# Patient Record
Sex: Female | Born: 1960 | Race: White | Hispanic: No | Marital: Married | Smoking: Never smoker
Health system: Southern US, Community
[De-identification: ages and names within clinical notes are randomized; demographics above are authoritative.]

## PROBLEM LIST (undated history)

## (undated) DIAGNOSIS — E119 Type 2 diabetes mellitus without complications: Secondary | ICD-10-CM

## (undated) DIAGNOSIS — R079 Chest pain, unspecified: Secondary | ICD-10-CM

## (undated) DIAGNOSIS — G4730 Sleep apnea, unspecified: Secondary | ICD-10-CM

## (undated) DIAGNOSIS — M5137 Other intervertebral disc degeneration, lumbosacral region: Secondary | ICD-10-CM

## (undated) DIAGNOSIS — R7302 Impaired glucose tolerance (oral): Secondary | ICD-10-CM

## (undated) DIAGNOSIS — F411 Generalized anxiety disorder: Secondary | ICD-10-CM

## (undated) DIAGNOSIS — M51379 Other intervertebral disc degeneration, lumbosacral region without mention of lumbar back pain or lower extremity pain: Secondary | ICD-10-CM

## (undated) DIAGNOSIS — G459 Transient cerebral ischemic attack, unspecified: Secondary | ICD-10-CM

## (undated) DIAGNOSIS — I341 Nonrheumatic mitral (valve) prolapse: Secondary | ICD-10-CM

## (undated) DIAGNOSIS — E041 Nontoxic single thyroid nodule: Secondary | ICD-10-CM

## (undated) DIAGNOSIS — F329 Major depressive disorder, single episode, unspecified: Secondary | ICD-10-CM

## (undated) DIAGNOSIS — G4733 Obstructive sleep apnea (adult) (pediatric): Secondary | ICD-10-CM

## (undated) DIAGNOSIS — J45909 Unspecified asthma, uncomplicated: Secondary | ICD-10-CM

## (undated) DIAGNOSIS — E781 Pure hyperglyceridemia: Secondary | ICD-10-CM

## (undated) DIAGNOSIS — M7712 Lateral epicondylitis, left elbow: Secondary | ICD-10-CM

## (undated) DIAGNOSIS — K7581 Nonalcoholic steatohepatitis (NASH): Secondary | ICD-10-CM

## (undated) DIAGNOSIS — I10 Essential (primary) hypertension: Secondary | ICD-10-CM

## (undated) DIAGNOSIS — E785 Hyperlipidemia, unspecified: Secondary | ICD-10-CM

## (undated) DIAGNOSIS — G4710 Hypersomnia, unspecified: Secondary | ICD-10-CM

## (undated) DIAGNOSIS — E042 Nontoxic multinodular goiter: Secondary | ICD-10-CM

## (undated) DIAGNOSIS — G5601 Carpal tunnel syndrome, right upper limb: Secondary | ICD-10-CM

## (undated) HISTORY — DX: Hyperlipidemia, unspecified: E78.5

## (undated) HISTORY — DX: Major depressive disorder, single episode, unspecified: F32.9

## (undated) HISTORY — PX: WISDOM TOOTH EXTRACTION: SHX21

## (undated) HISTORY — DX: Impaired glucose tolerance (oral): R73.02

## (undated) HISTORY — DX: Transient cerebral ischemic attack, unspecified: G45.9

## (undated) HISTORY — DX: Chest pain, unspecified: R07.9

## (undated) HISTORY — DX: Type 2 diabetes mellitus without complications: E11.9

## (undated) HISTORY — DX: Hypersomnia, unspecified: G47.10

## (undated) HISTORY — DX: Carpal tunnel syndrome, right upper limb: G56.01

## (undated) HISTORY — PX: INGUINAL HERNIA REPAIR: SUR1180

## (undated) HISTORY — DX: Pure hyperglyceridemia: E78.1

## (undated) HISTORY — DX: Nonrheumatic mitral (valve) prolapse: I34.1

## (undated) HISTORY — DX: Sleep apnea, unspecified: G47.30

## (undated) HISTORY — DX: Nontoxic multinodular goiter: E04.2

## (undated) HISTORY — DX: Nonalcoholic steatohepatitis (NASH): K75.81

## (undated) HISTORY — PX: CHOLECYSTECTOMY: SHX55

## (undated) HISTORY — DX: Other intervertebral disc degeneration, lumbosacral region: M51.37

## (undated) HISTORY — DX: Other intervertebral disc degeneration, lumbosacral region without mention of lumbar back pain or lower extremity pain: M51.379

## (undated) HISTORY — DX: Generalized anxiety disorder: F41.1

## (undated) HISTORY — DX: Nontoxic single thyroid nodule: E04.1

## (undated) HISTORY — DX: Lateral epicondylitis, left elbow: M77.12

## (undated) HISTORY — DX: Essential (primary) hypertension: I10

## (undated) HISTORY — DX: Obstructive sleep apnea (adult) (pediatric): G47.33

## (undated) HISTORY — DX: Unspecified asthma, uncomplicated: J45.909

---

## 1999-03-17 ENCOUNTER — Other Ambulatory Visit: Admission: RE | Admit: 1999-03-17 | Discharge: 1999-03-17 | Payer: Self-pay | Admitting: Obstetrics & Gynecology

## 2000-05-07 ENCOUNTER — Encounter: Payer: Self-pay | Admitting: Emergency Medicine

## 2000-05-07 ENCOUNTER — Emergency Department (HOSPITAL_COMMUNITY): Admission: EM | Admit: 2000-05-07 | Discharge: 2000-05-07 | Payer: Self-pay | Admitting: Emergency Medicine

## 2004-03-30 ENCOUNTER — Ambulatory Visit: Payer: Self-pay | Admitting: Internal Medicine

## 2004-05-17 ENCOUNTER — Ambulatory Visit: Payer: Self-pay | Admitting: Internal Medicine

## 2004-06-03 ENCOUNTER — Ambulatory Visit: Payer: Self-pay | Admitting: Internal Medicine

## 2005-04-17 ENCOUNTER — Ambulatory Visit: Payer: Self-pay | Admitting: Internal Medicine

## 2005-08-25 ENCOUNTER — Ambulatory Visit: Payer: Self-pay | Admitting: Internal Medicine

## 2006-08-23 ENCOUNTER — Ambulatory Visit: Payer: Self-pay | Admitting: Internal Medicine

## 2006-08-23 LAB — CONVERTED CEMR LAB
ALT: 24 units/L (ref 0–40)
AST: 20 units/L (ref 0–37)
Albumin: 3.6 g/dL (ref 3.5–5.2)
Alkaline Phosphatase: 76 units/L (ref 39–117)
BUN: 10 mg/dL (ref 6–23)
Basophils Absolute: 0 10*3/uL (ref 0.0–0.1)
Basophils Relative: 0.4 % (ref 0.0–1.0)
Bilirubin Urine: NEGATIVE
Bilirubin, Direct: 0.1 mg/dL (ref 0.0–0.3)
CO2: 25 meq/L (ref 19–32)
Calcium: 9.2 mg/dL (ref 8.4–10.5)
Chloride: 107 meq/L (ref 96–112)
Cholesterol: 193 mg/dL (ref 0–200)
Creatinine, Ser: 0.8 mg/dL (ref 0.4–1.2)
Direct LDL: 95.3 mg/dL
Eosinophils Absolute: 0.2 10*3/uL (ref 0.0–0.6)
Eosinophils Relative: 3.7 % (ref 0.0–5.0)
GFR calc Af Amer: 101 mL/min
GFR calc non Af Amer: 83 mL/min
Glucose, Bld: 91 mg/dL (ref 70–99)
HCT: 38.7 % (ref 36.0–46.0)
HDL: 41.9 mg/dL (ref >39.0)
Hemoglobin, Urine: NEGATIVE
Hemoglobin: 13.4 g/dL (ref 12.0–15.0)
Ketones, ur: NEGATIVE mg/dL
Leukocytes, UA: NEGATIVE
Lymphocytes Relative: 32.2 % (ref 12.0–46.0)
MCHC: 34.5 g/dL (ref 30.0–36.0)
MCV: 83.1 fL (ref 78.0–100.0)
Monocytes Absolute: 0.5 10*3/uL (ref 0.2–0.7)
Monocytes Relative: 7.1 % (ref 3.0–11.0)
Neutro Abs: 3.6 10*3/uL (ref 1.4–7.7)
Neutrophils Relative %: 56.6 % (ref 43.0–77.0)
Nitrite: NEGATIVE
Platelets: 278 10*3/uL (ref 150–400)
Potassium: 4.5 meq/L (ref 3.5–5.1)
RBC: 4.66 M/uL (ref 3.87–5.11)
RDW: 14 % (ref 11.5–14.6)
Sodium: 138 meq/L (ref 135–145)
Specific Gravity, Urine: 1.025 (ref 1.000–1.03)
TSH: 2.24 microintl units/mL (ref 0.35–5.50)
Total Bilirubin: 0.7 mg/dL (ref 0.3–1.2)
Total CHOL/HDL Ratio: 4.6
Total Protein, Urine: NEGATIVE mg/dL
Total Protein: 6.9 g/dL (ref 6.0–8.3)
Triglycerides: 391 mg/dL (ref 0–149)
Urine Glucose: NEGATIVE mg/dL
Urobilinogen, UA: 0.2 (ref 0.0–1.0)
VLDL: 78 mg/dL — ABNORMAL HIGH (ref 0–40)
WBC: 6.4 10*3/uL (ref 4.5–10.5)
pH: 5.5 (ref 5.0–8.0)

## 2007-06-04 ENCOUNTER — Observation Stay (HOSPITAL_COMMUNITY): Admission: EM | Admit: 2007-06-04 | Discharge: 2007-06-05 | Payer: Self-pay | Admitting: Emergency Medicine

## 2007-06-04 ENCOUNTER — Ambulatory Visit: Payer: Self-pay | Admitting: Internal Medicine

## 2007-06-04 ENCOUNTER — Ambulatory Visit: Payer: Self-pay | Admitting: Cardiovascular Disease

## 2007-06-04 ENCOUNTER — Ambulatory Visit: Payer: Self-pay | Admitting: Cardiology

## 2007-06-05 ENCOUNTER — Encounter: Payer: Self-pay | Admitting: Internal Medicine

## 2007-06-06 ENCOUNTER — Encounter: Payer: Self-pay | Admitting: Internal Medicine

## 2007-06-06 ENCOUNTER — Ambulatory Visit: Payer: Self-pay

## 2007-06-17 ENCOUNTER — Ambulatory Visit: Payer: Self-pay | Admitting: Internal Medicine

## 2007-06-17 DIAGNOSIS — F411 Generalized anxiety disorder: Secondary | ICD-10-CM

## 2007-06-17 DIAGNOSIS — R079 Chest pain, unspecified: Secondary | ICD-10-CM | POA: Insufficient documentation

## 2007-06-17 DIAGNOSIS — E781 Pure hyperglyceridemia: Secondary | ICD-10-CM | POA: Insufficient documentation

## 2007-06-17 DIAGNOSIS — I10 Essential (primary) hypertension: Secondary | ICD-10-CM

## 2007-06-17 DIAGNOSIS — F329 Major depressive disorder, single episode, unspecified: Secondary | ICD-10-CM | POA: Insufficient documentation

## 2007-06-17 DIAGNOSIS — F3289 Other specified depressive episodes: Secondary | ICD-10-CM

## 2007-06-17 DIAGNOSIS — E739 Lactose intolerance, unspecified: Secondary | ICD-10-CM | POA: Insufficient documentation

## 2007-06-17 DIAGNOSIS — J45909 Unspecified asthma, uncomplicated: Secondary | ICD-10-CM | POA: Insufficient documentation

## 2007-06-17 HISTORY — DX: Generalized anxiety disorder: F41.1

## 2007-06-17 HISTORY — DX: Major depressive disorder, single episode, unspecified: F32.9

## 2007-06-17 HISTORY — DX: Unspecified asthma, uncomplicated: J45.909

## 2007-06-17 HISTORY — DX: Other specified depressive episodes: F32.89

## 2007-06-17 HISTORY — DX: Pure hyperglyceridemia: E78.1

## 2007-06-17 HISTORY — DX: Essential (primary) hypertension: I10

## 2007-06-17 HISTORY — DX: Chest pain, unspecified: R07.9

## 2007-08-23 ENCOUNTER — Ambulatory Visit: Payer: Self-pay | Admitting: Internal Medicine

## 2007-08-28 ENCOUNTER — Encounter: Payer: Self-pay | Admitting: Internal Medicine

## 2007-12-09 ENCOUNTER — Ambulatory Visit: Payer: Self-pay | Admitting: Internal Medicine

## 2007-12-09 LAB — CONVERTED CEMR LAB
Cholesterol: 180 mg/dL (ref 0–200)
Direct LDL: 90.6 mg/dL
HDL: 34.8 mg/dL — ABNORMAL LOW (ref >39.0)
Total CHOL/HDL Ratio: 5.2
Triglycerides: 269 mg/dL (ref 0–149)
VLDL: 54 mg/dL — ABNORMAL HIGH (ref 0–40)

## 2007-12-13 ENCOUNTER — Telehealth (INDEPENDENT_AMBULATORY_CARE_PROVIDER_SITE_OTHER): Payer: Self-pay | Admitting: *Deleted

## 2008-03-16 ENCOUNTER — Ambulatory Visit: Payer: Self-pay | Admitting: Internal Medicine

## 2008-03-16 DIAGNOSIS — J209 Acute bronchitis, unspecified: Secondary | ICD-10-CM | POA: Insufficient documentation

## 2008-04-15 ENCOUNTER — Telehealth (INDEPENDENT_AMBULATORY_CARE_PROVIDER_SITE_OTHER): Payer: Self-pay | Admitting: *Deleted

## 2008-08-26 ENCOUNTER — Telehealth (INDEPENDENT_AMBULATORY_CARE_PROVIDER_SITE_OTHER): Payer: Self-pay | Admitting: *Deleted

## 2008-10-09 ENCOUNTER — Encounter: Payer: Self-pay | Admitting: Internal Medicine

## 2008-10-19 ENCOUNTER — Ambulatory Visit: Payer: Self-pay | Admitting: Internal Medicine

## 2008-10-19 DIAGNOSIS — E041 Nontoxic single thyroid nodule: Secondary | ICD-10-CM | POA: Insufficient documentation

## 2008-10-19 HISTORY — DX: Nontoxic single thyroid nodule: E04.1

## 2008-10-20 ENCOUNTER — Encounter: Payer: Self-pay | Admitting: Internal Medicine

## 2008-10-20 LAB — CONVERTED CEMR LAB
ALT: 22 units/L (ref 0–35)
AST: 25 units/L (ref 0–37)
Albumin: 3.5 g/dL (ref 3.5–5.2)
Alkaline Phosphatase: 71 units/L (ref 39–117)
BUN: 10 mg/dL (ref 6–23)
Basophils Absolute: 0.1 10*3/uL (ref 0.0–0.1)
Basophils Relative: 1 % (ref 0.0–3.0)
Bilirubin Urine: NEGATIVE
Bilirubin, Direct: 0.1 mg/dL (ref 0.0–0.3)
CO2: 24 meq/L (ref 19–32)
Calcium: 8.9 mg/dL (ref 8.4–10.5)
Chloride: 113 meq/L — ABNORMAL HIGH (ref 96–112)
Cholesterol: 203 mg/dL — ABNORMAL HIGH (ref 0–200)
Creatinine, Ser: 0.7 mg/dL (ref 0.4–1.2)
Direct LDL: 95.1 mg/dL
Eosinophils Absolute: 0.3 10*3/uL (ref 0.0–0.7)
Eosinophils Relative: 3.5 % (ref 0.0–5.0)
GFR calc non Af Amer: 95.99 mL/min (ref >60)
Glucose, Bld: 89 mg/dL (ref 70–99)
HCT: 35.4 % — ABNORMAL LOW (ref 36.0–46.0)
HDL: 45.2 mg/dL (ref >39.00)
Hemoglobin, Urine: NEGATIVE
Hemoglobin: 12.3 g/dL (ref 12.0–15.0)
Ketones, ur: NEGATIVE mg/dL
Leukocytes, UA: NEGATIVE
Lymphocytes Relative: 27.6 % (ref 12.0–46.0)
Lymphs Abs: 2 10*3/uL (ref 0.7–4.0)
MCHC: 34.8 g/dL (ref 30.0–36.0)
MCV: 82.9 fL (ref 78.0–100.0)
Monocytes Absolute: 0.6 10*3/uL (ref 0.1–1.0)
Monocytes Relative: 7.6 % (ref 3.0–12.0)
Neutro Abs: 4.4 10*3/uL (ref 1.4–7.7)
Neutrophils Relative %: 60.3 % (ref 43.0–77.0)
Nitrite: NEGATIVE
Platelets: 210 10*3/uL (ref 150.0–400.0)
Potassium: 4.2 meq/L (ref 3.5–5.1)
RBC: 4.28 M/uL (ref 3.87–5.11)
RDW: 13.9 % (ref 11.5–14.6)
Sodium: 140 meq/L (ref 135–145)
Specific Gravity, Urine: >=1.03 (ref 1.000–1.030)
TSH: 1.54 microintl units/mL (ref 0.35–5.50)
Total Bilirubin: 0.5 mg/dL (ref 0.3–1.2)
Total CHOL/HDL Ratio: 4
Total Protein, Urine: NEGATIVE mg/dL
Total Protein: 6.8 g/dL (ref 6.0–8.3)
Triglycerides: 387 mg/dL — ABNORMAL HIGH (ref 0.0–149.0)
Urine Glucose: NEGATIVE mg/dL
Urobilinogen, UA: 0.2 (ref 0.0–1.0)
VLDL: 77.4 mg/dL — ABNORMAL HIGH (ref 0.0–40.0)
WBC: 7.4 10*3/uL (ref 4.5–10.5)
pH: 5.5 (ref 5.0–8.0)

## 2008-10-22 ENCOUNTER — Encounter: Admission: RE | Admit: 2008-10-22 | Discharge: 2008-10-22 | Payer: Self-pay | Admitting: Internal Medicine

## 2008-10-22 DIAGNOSIS — E042 Nontoxic multinodular goiter: Secondary | ICD-10-CM

## 2008-10-22 HISTORY — DX: Nontoxic multinodular goiter: E04.2

## 2008-10-22 LAB — CONVERTED CEMR LAB: Vit D, 25-Hydroxy: 20 ng/mL — ABNORMAL LOW (ref 30–89)

## 2008-10-23 ENCOUNTER — Telehealth (INDEPENDENT_AMBULATORY_CARE_PROVIDER_SITE_OTHER): Payer: Self-pay | Admitting: *Deleted

## 2008-10-27 ENCOUNTER — Encounter: Payer: Self-pay | Admitting: Endocrinology

## 2008-10-27 HISTORY — PX: BIOPSY BREAST: PRO8

## 2008-10-27 LAB — HM MAMMOGRAPHY: HM Mammogram: ABNORMAL

## 2008-10-30 ENCOUNTER — Encounter: Payer: Self-pay | Admitting: Internal Medicine

## 2008-11-04 ENCOUNTER — Encounter: Payer: Self-pay | Admitting: Internal Medicine

## 2008-11-05 ENCOUNTER — Encounter: Payer: Self-pay | Admitting: Internal Medicine

## 2008-11-05 ENCOUNTER — Ambulatory Visit: Payer: Self-pay | Admitting: Endocrinology

## 2008-11-09 ENCOUNTER — Encounter: Payer: Self-pay | Admitting: Internal Medicine

## 2008-11-12 ENCOUNTER — Telehealth: Payer: Self-pay | Admitting: Internal Medicine

## 2008-12-23 IMAGING — CT CT ANGIO CHEST
3 of 5 series · 18 of 30 positions shown · IV contrast (100 ML OMNI 300)
Comparison: None.

CLINICAL DATA: Chest pain/short of breath/elevated D-dimer. 
 CHEST CT WITH CONTRAST:
TECHNIQUE: Multidetector CT imaging of the chest was performed following the standard protocol during bolus administration of intravenous contrast.
 Contrast:  100 cc Omnipaque 300

[Series 2: pe · axial · 0.70mm/px · z∈[-280,-56]mm · 9 of 231 slices shown]
[im 26/231  lung]
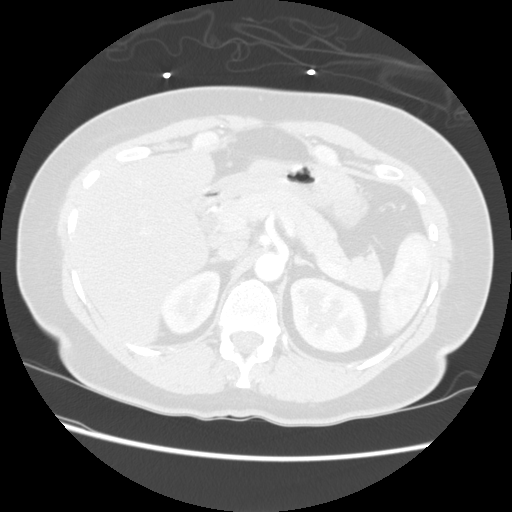
[im 52/231  mediastinal]
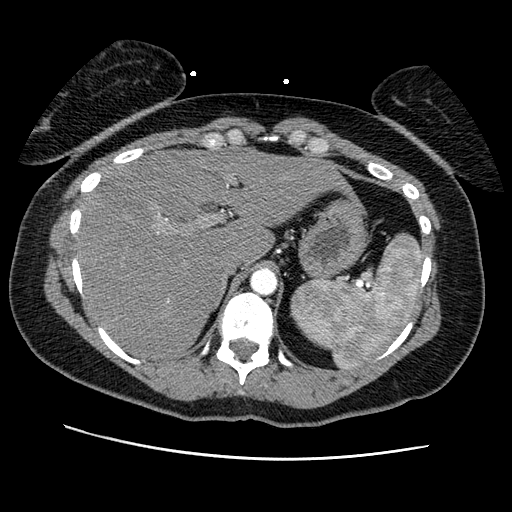
[im 77/231  lung]
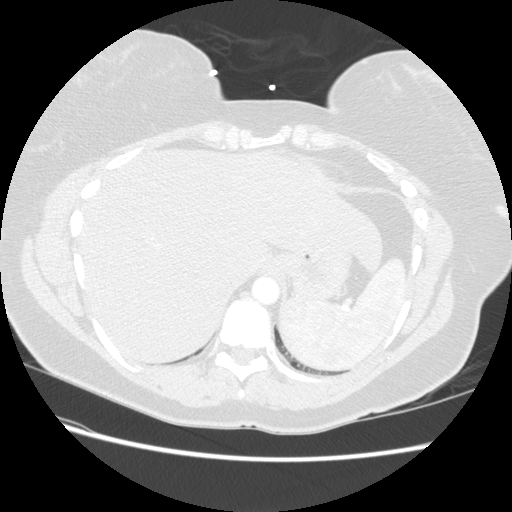
[im 103/231  mediastinal]
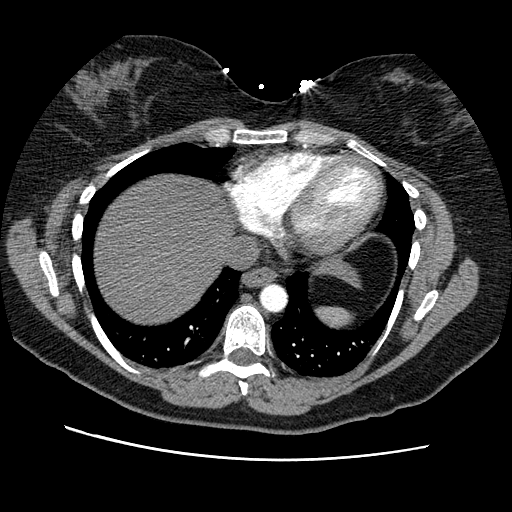
[im 123/231  lung]
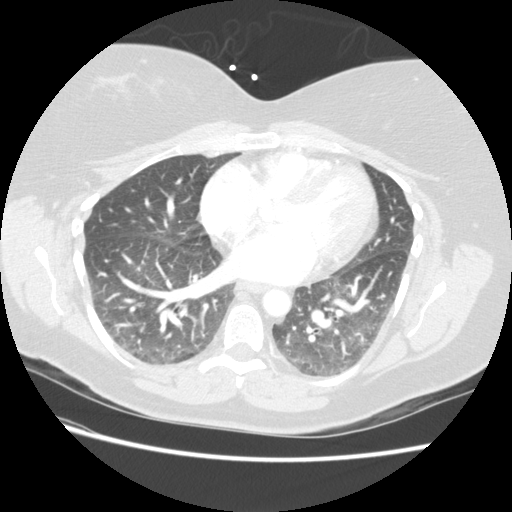
[im 128/231  mediastinal]
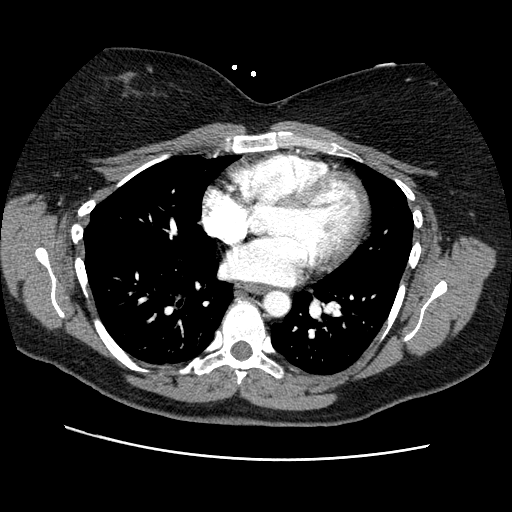
[im 154/231  lung]
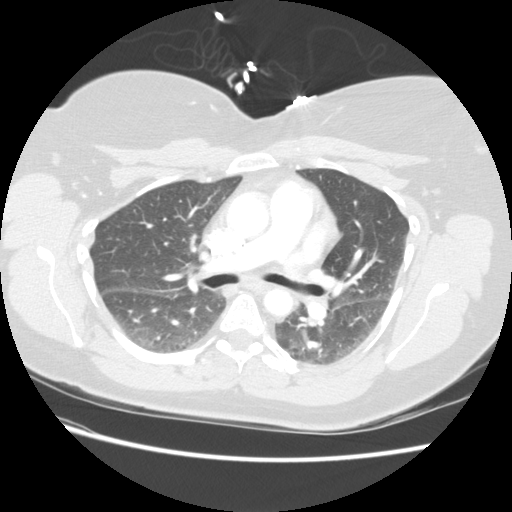
[im 179/231  mediastinal]
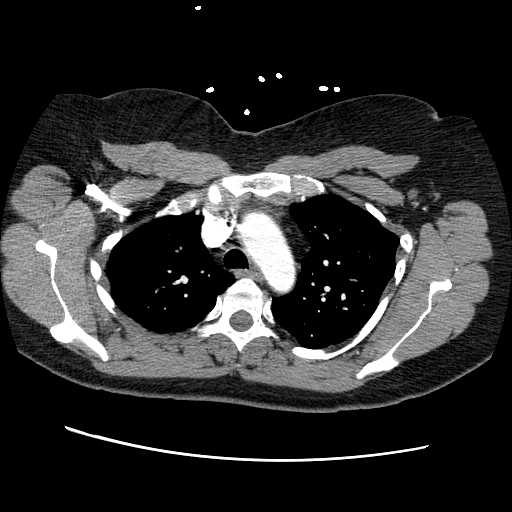
[im 205/231  lung]
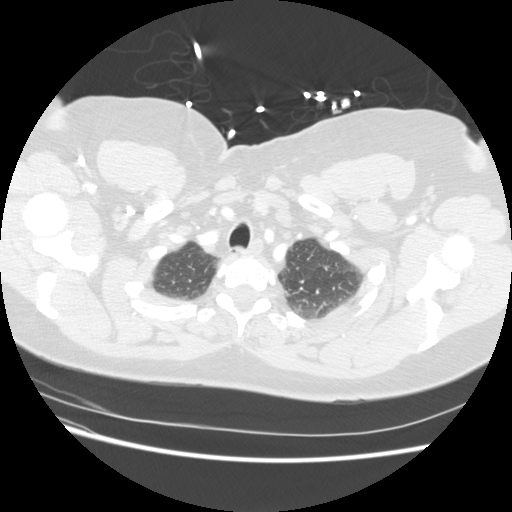

[Series 3: recon 2: pe · axial · 0.70mm/px · z∈[-242,-96]mm · 4 of 116 slices shown]
[im 29/116  lung]
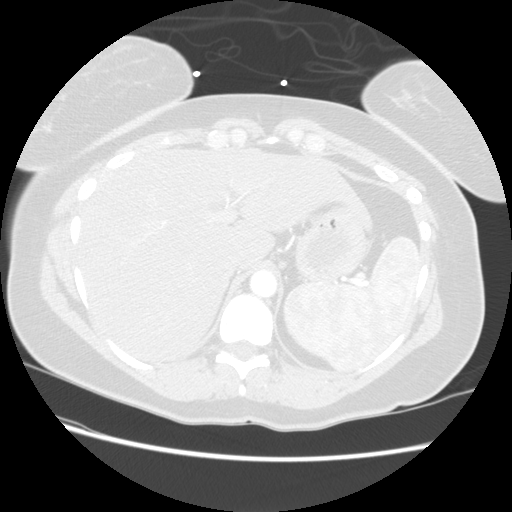
[im 58/116  lung]
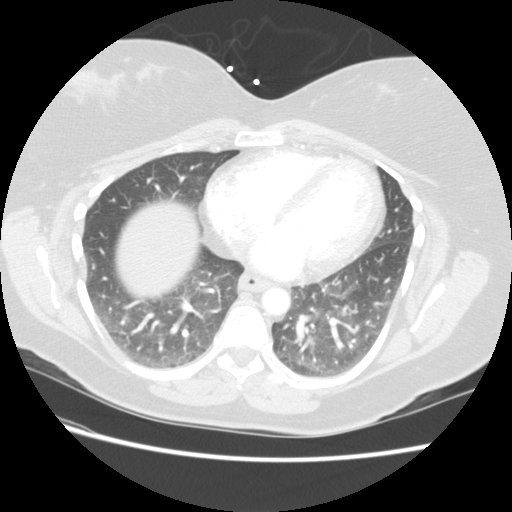
[im 62/116  lung]
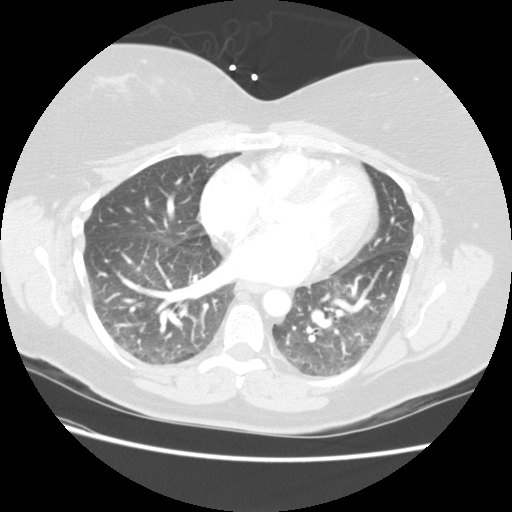
[im 87/116  lung]
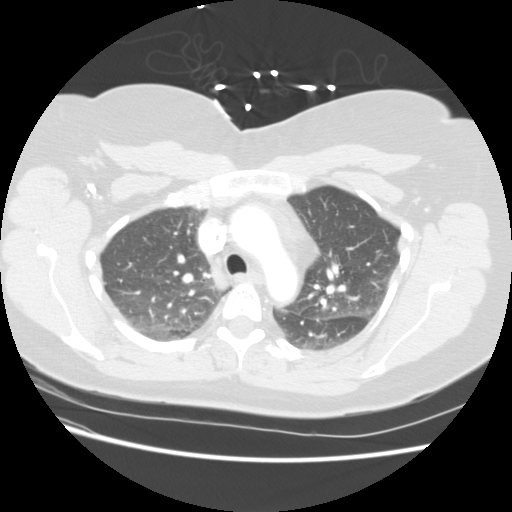

[Series 201: reformatted · sagittal · 0.70mm/px · 5 of 149 slices shown]
[im 25/149  lung]
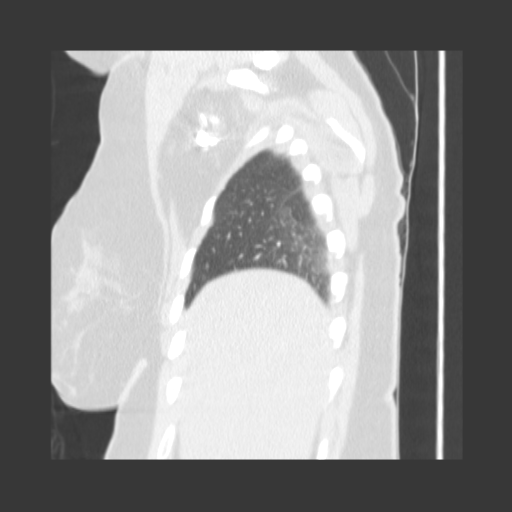
[im 50/149  lung]
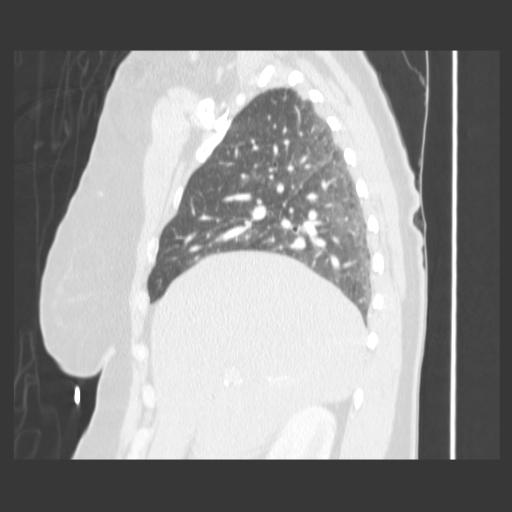
[im 75/149  lung]
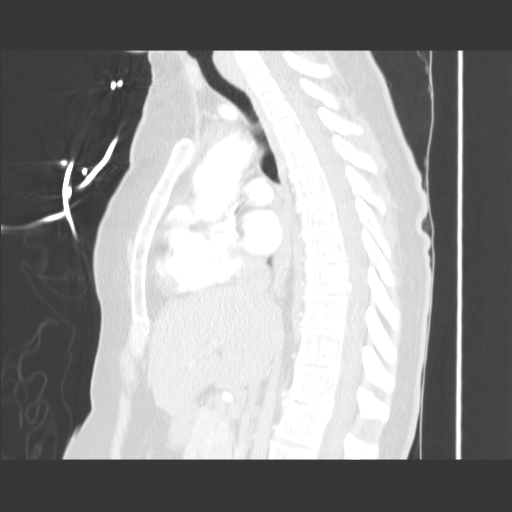
[im 99/149  lung]
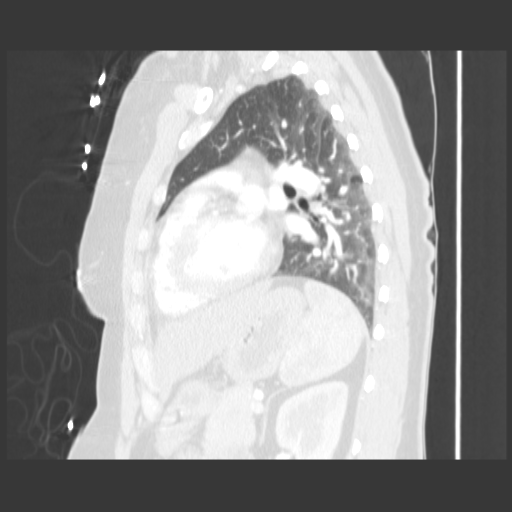
[im 124/149  lung]
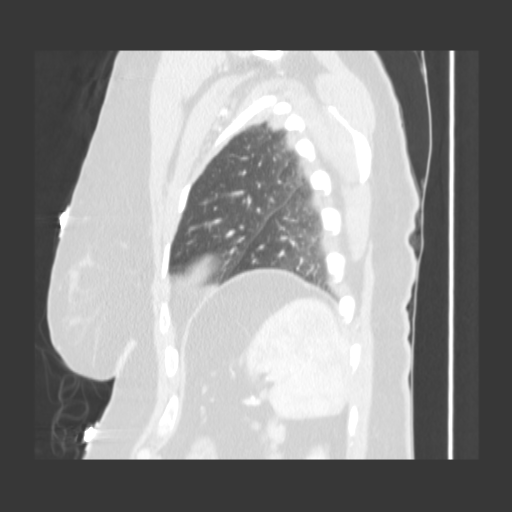

[18 of 30 positions shown; findings below may reference images not displayed]

FINDINGS: In three planes, no pulmonary emboli are detected.  Thoracic aorta is normal.  No mediastinal adenopathy.  No pleural or pericardial fluid.  No axillary nodes or chest wall masses. 
 No osseous lesions.  
 There is no pneumothorax.  No lung consolidation.  There is some minimal ground glass density along the fissures probably related to mild dependent edema. 
 Some of the lower cuts include some of the upper abdominal structures without definite pathology.  Patient has had a cholecystectomy.  The common bile duct is moderately dilated but within normal limits postcholecystectomy.  It measures 11 mm on image #113.
IMPRESSION: 1.  No pulmonary emboli. 
 2.  Aorta normal. 
 3.  There may be some minimal dependent edema in the lungs, along the fissures, but there is no definite pneumonia. 
 4.  No other acute or significant findings.

## 2009-03-04 ENCOUNTER — Telehealth: Payer: Self-pay | Admitting: Internal Medicine

## 2009-12-06 ENCOUNTER — Ambulatory Visit: Payer: Self-pay | Admitting: Internal Medicine

## 2009-12-06 DIAGNOSIS — G4710 Hypersomnia, unspecified: Secondary | ICD-10-CM

## 2009-12-06 DIAGNOSIS — G4733 Obstructive sleep apnea (adult) (pediatric): Secondary | ICD-10-CM | POA: Insufficient documentation

## 2009-12-06 HISTORY — DX: Hypersomnia, unspecified: G47.10

## 2009-12-06 LAB — CONVERTED CEMR LAB
ALT: 93 units/L — ABNORMAL HIGH (ref 0–35)
AST: 62 units/L — ABNORMAL HIGH (ref 0–37)
Albumin: 4.2 g/dL (ref 3.5–5.2)
Alkaline Phosphatase: 95 units/L (ref 39–117)
BUN: 16 mg/dL (ref 6–23)
Basophils Absolute: 0 10*3/uL (ref 0.0–0.1)
Basophils Relative: 0.5 % (ref 0.0–3.0)
Bilirubin Urine: NEGATIVE
Bilirubin, Direct: 0.1 mg/dL (ref 0.0–0.3)
CO2: 26 meq/L (ref 19–32)
Calcium: 9.6 mg/dL (ref 8.4–10.5)
Chloride: 110 meq/L (ref 96–112)
Cholesterol: 296 mg/dL — ABNORMAL HIGH (ref 0–200)
Creatinine, Ser: 0.9 mg/dL (ref 0.4–1.2)
Direct LDL: 202 mg/dL
Eosinophils Absolute: 0.2 10*3/uL (ref 0.0–0.7)
Eosinophils Relative: 3.3 % (ref 0.0–5.0)
GFR calc non Af Amer: 75.31 mL/min (ref >60)
Glucose, Bld: 113 mg/dL — ABNORMAL HIGH (ref 70–99)
HCT: 41.1 % (ref 36.0–46.0)
HDL: 43.8 mg/dL (ref >39.00)
Hemoglobin: 14.1 g/dL (ref 12.0–15.0)
Ketones, ur: NEGATIVE mg/dL
Lymphocytes Relative: 28.4 % (ref 12.0–46.0)
Lymphs Abs: 1.7 10*3/uL (ref 0.7–4.0)
MCHC: 34.4 g/dL (ref 30.0–36.0)
MCV: 83.2 fL (ref 78.0–100.0)
Monocytes Absolute: 0.4 10*3/uL (ref 0.1–1.0)
Monocytes Relative: 7.6 % (ref 3.0–12.0)
Neutro Abs: 3.5 10*3/uL (ref 1.4–7.7)
Neutrophils Relative %: 60.2 % (ref 43.0–77.0)
Nitrite: NEGATIVE
Platelets: 274 10*3/uL (ref 150.0–400.0)
Potassium: 4.9 meq/L (ref 3.5–5.1)
RBC: 4.93 M/uL (ref 3.87–5.11)
RDW: 16.1 % — ABNORMAL HIGH (ref 11.5–14.6)
Sodium: 140 meq/L (ref 135–145)
Specific Gravity, Urine: >=1.03 (ref 1.000–1.030)
TSH: 1.35 microintl units/mL (ref 0.35–5.50)
Total Bilirubin: 0.6 mg/dL (ref 0.3–1.2)
Total CHOL/HDL Ratio: 7
Total Protein, Urine: NEGATIVE mg/dL
Total Protein: 7.6 g/dL (ref 6.0–8.3)
Triglycerides: 238 mg/dL — ABNORMAL HIGH (ref 0.0–149.0)
Urine Glucose: NEGATIVE mg/dL
Urobilinogen, UA: 0.2 (ref 0.0–1.0)
VLDL: 47.6 mg/dL — ABNORMAL HIGH (ref 0.0–40.0)
WBC: 5.8 10*3/uL (ref 4.5–10.5)
pH: 5.5 (ref 5.0–8.0)

## 2009-12-09 ENCOUNTER — Encounter: Admission: RE | Admit: 2009-12-09 | Discharge: 2009-12-09 | Payer: Self-pay | Admitting: Internal Medicine

## 2010-01-06 ENCOUNTER — Telehealth (INDEPENDENT_AMBULATORY_CARE_PROVIDER_SITE_OTHER): Payer: Self-pay | Admitting: *Deleted

## 2010-01-10 ENCOUNTER — Ambulatory Visit: Payer: Self-pay | Admitting: Internal Medicine

## 2010-01-10 LAB — CONVERTED CEMR LAB
ALT: 69 units/L — ABNORMAL HIGH (ref 0–35)
AST: 53 units/L — ABNORMAL HIGH (ref 0–37)
Albumin: 3.9 g/dL (ref 3.5–5.2)
Alkaline Phosphatase: 94 units/L (ref 39–117)
Bilirubin, Direct: 0.1 mg/dL (ref 0.0–0.3)
Cholesterol: 183 mg/dL (ref 0–200)
Direct LDL: 86.7 mg/dL
HDL: 34.9 mg/dL — ABNORMAL LOW (ref >39.00)
Total Bilirubin: 0.4 mg/dL (ref 0.3–1.2)
Total CHOL/HDL Ratio: 5
Total Protein: 6.7 g/dL (ref 6.0–8.3)
Triglycerides: 389 mg/dL — ABNORMAL HIGH (ref 0.0–149.0)
VLDL: 77.8 mg/dL — ABNORMAL HIGH (ref 0.0–40.0)

## 2010-06-28 NOTE — Progress Notes (Signed)
Summary: medication change  Phone Note Call from Patient Call back at Home Phone [phone]   Caller: [phone] Call For: Corwin Levins MD Summary of Call: .per Rosiland Oz  call the [facility]  is not working want to try zoloft [facility] [phone]* (retail) [address] [place], [place]  [address] Ph: [phone] Fax: [phone]* Initial call taken by: Shelbie Proctor,  April 15, 2008 4:15 PM  Follow-up for Phone Call        ok to change to zoloft - done escript Follow-up by: Corwin Levins MD,  April 15, 2008 5:46 PM  Additional Follow-up for Phone Call Additional follow up Details #1::        called pt to inform that rx was sent to pharmacy left msg on machine  Additional Follow-up by: Shelbie Proctor,  April 16, 2008 8:33 AM    New/Updated Medications: SERTRALINE HCL 100 MG TABS (SERTRALINE HCL) 1 by mouth once daily   Prescriptions: SERTRALINE HCL 100 MG TABS (SERTRALINE HCL) 1 by mouth once daily  #30 x 11   Entered and Authorized by:   Corwin Levins MD   Signed by:   Corwin Levins MD on 04/15/2008   Method used:   Electronically to        [facility] [phone]* (retail)       [address] [place], [place]  [address]       Ph: [phone]       Fax: [phone]   RxID:   [id number]

## 2010-06-28 NOTE — Assessment & Plan Note (Signed)
Summary: med refills/50/cd   Vital Signs:  Patient profile:   50 year old female Height:      64 inches Weight:      197.25 pounds BMI:     33.98 O2 Sat:      97 % Temp:     97.3 degrees F oral Pulse rate:   79 / minute BP sitting:   110 / 84  (left arm) Cuff size:   regular  Vitals Entered By: Windell Norfolk (Oct 19, 2008 3:40 PM)  CC: med refills/welness   CC:  med refills/welness.  History of Present Illness: overall doing well , no complaints, meds working well except more stress lately - she does not think her anxiety is that bad and certatinly improved iwth the zoloft generic at 100 mg but friends and co-workers have asked her to have her dose increased  Problems Prior to Update: 1)  Thyroid Nodule, Right  (ICD-241.0) 2)  Preventive Health Care  (ICD-V70.0) 3)  Asthmatic Bronchitis, Acute  (ICD-466.0) 4)  Asthma  (ICD-493.90) 5)  Glucose Intolerance  (ICD-271.3) 6)  Depression  (ICD-311) 7)  Anxiety  (ICD-300.00) 8)  Preventive Health Care  (ICD-V70.0) 9)  Hypertension  (ICD-401.9) 10)  Hypertriglyceridemia  (ICD-272.1) 11)  Chest Pain  (ICD-786.50)  Medications Prior to Update: 1)  Alprazolam 0.25 Mg Tabs (Alprazolam) .... Take 1 Tablet By Mouth Three Times A Day As Needed 2)  Advair Diskus 100-50 Mcg/dose  Misc (Fluticasone-Salmeterol) 3)  Lisinopril 20 Mg  Tabs (Lisinopril) .Marland Kitchen.. 1 By Mouth Qd 4)  Hyoscyamine Sulfate 0.125 Mg  Tbdp (Hyoscyamine Sulfate) .Marland Kitchen.. 1 Po Qid Prn 5)  Gemfibrozil 600 Mg  Tabs (Gemfibrozil) .Marland Kitchen.. 1 By Mouth Bid 6)  Ecotrin Low Strength 81 Mg  Tbec (Aspirin) .Marland Kitchen.. 1 By Mouth Qd 7)  Azithromycin 250 Mg  Tabs (Azithromycin) .... 2po Qd For 1 Day, Then 1po Qd For 4days, Then Stop 8)  Promethazine-Codeine 6.25-10 Mg/63ml  Syrp (Promethazine-Codeine) .Marland Kitchen.. 1 Tsp By Mouth Q6hrs As Needed 9)  Sertraline Hcl 100 Mg Tabs (Sertraline Hcl) .Marland Kitchen.. 1 By Mouth Once Daily 10)  Prednisone 10 Mg Tabs (Prednisone) .... 4po Qd For 3days, Then 3po Qd For 3days, Then  2po Qd For 3days, Then 1po Qd For 3 Days, Then Stop  Current Medications (verified): 1)  Alprazolam 0.25 Mg Tabs (Alprazolam) .... Take 1 Tablet By Mouth Three Times A Day As Needed 2)  Advair Diskus 100-50 Mcg/dose  Misc (Fluticasone-Salmeterol) .Marland Kitchen.. 1 Puff Two Times A Day 3)  Lisinopril 20 Mg  Tabs (Lisinopril) .Marland Kitchen.. 1 By Mouth Once Daily 4)  Hyoscyamine Sulfate 0.125 Mg  Tbdp (Hyoscyamine Sulfate) .Marland Kitchen.. 1 Po Qid As Needed 5)  Gemfibrozil 600 Mg  Tabs (Gemfibrozil) .Marland Kitchen.. 1 By Mouth Two Times A Day 6)  Ecotrin Low Strength 81 Mg  Tbec (Aspirin) .Marland Kitchen.. 1 By Mouth Qd 7)  Sertraline Hcl 100 Mg Tabs (Sertraline Hcl) .... 2 By Mouth Once Daily  Allergies (verified): No Known Drug Allergies  Past History:  Past Medical History:    hypertriglyceridemia    Hypertension    Anxiety    Depression    glucose intolerance    asthma    lumbar spine disc disease    hx of MVP     (06/17/2007)  Past Surgical History:    Cholecystectomy     (06/17/2007)  Family History:    father with CAD, HTN, DM    mother with HTN, DM    uncle with ETOH  abuse    aunt with TB     (06/17/2007)  Social History:    Never Smoked    Alcohol use-no    Married    2 children     (06/17/2007)  Risk Factors:    Alcohol Use: N/A    >5 drinks/d w/in last 3 months: N/A    Caffeine Use: N/A    Diet: N/A    Exercise: N/A  Risk Factors:    Smoking Status: never (06/17/2007)    Packs/Day: N/A    Cigars/wk: N/A    Pipe Use/wk: N/A    Cans of tobacco/wk: N/A    Passive Smoke Exposure: N/A  Family History:    Reviewed history from 06/17/2007 and no changes required:       father with CAD, HTN, DM       mother with HTN, DM       uncle with ETOH abuse       aunt with TB  Social History:    Reviewed history from 06/17/2007 and no changes required:       Never Smoked       Alcohol use-no       Married       2 children  Review of Systems  The patient denies anorexia, fever, weight loss, weight gain,  vision loss, decreased hearing, hoarseness, chest pain, syncope, dyspnea on exertion, peripheral edema, prolonged cough, headaches, hemoptysis, abdominal pain, melena, hematochezia, severe indigestion/heartburn, hematuria, incontinence, muscle weakness, suspicious skin lesions, transient blindness, difficulty walking, depression, unusual weight change, abnormal bleeding, enlarged lymph nodes, angioedema, and breast masses.         all otherwise negative   Physical Exam  General:  alert and overweight-appearing.   Head:  normocephalic and atraumatic.   Eyes:  vision grossly intact, pupils equal, and pupils round.   Ears:  R ear normal and L ear normal.   Nose:  no external deformity and no nasal discharge.   Mouth:  no gingival abnormalities and pharynx pink and moist.   Neck:  supple and no masses excpet for prob right thyroid nodule ? > 1 cm, non tender Lungs:  normal respiratory effort and normal breath sounds.   Heart:  normal rate, regular rhythm, and no murmur.   Abdomen:  soft, non-tender, and normal bowel sounds.   Msk:  no joint tenderness and no joint swelling.   Extremities:  no edema, no ulcers  Neurologic:  cranial nerves II-XII intact and strength normal in all extremities.   Psych:  severely anxious.     Impression & Recommendations:  Problem # 1:  Preventive Health Care (ICD-V70.0)  Overall doing well, up to date, counseled on routine health concerns for screening and prevention, immunizations up to date or declined, labs orderd  Orders: TLB-BMP (Basic Metabolic Panel-BMET) (80048-METABOL) TLB-CBC Platelet - w/Differential (85025-CBCD) TLB-Hepatic/Liver Function Pnl (80076-HEPATIC) TLB-Lipid Panel (80061-LIPID) TLB-Udip ONLY (81003-UDIP) TLB-TSH (Thyroid Stimulating Hormone) (84443-TSH) T-Vitamin D (25-Hydroxy) ([address]-60[address])  Problem # 2:  THYROID NODULE, RIGHT (ICD-241.0)  for thyroid u/s  Orders: Misc. Referral (Misc. Ref)  Problem # 3:  DEPRESSION  (ICD-311) Assessment: Deteriorated  Her updated medication list for this problem includes:    Alprazolam 0.25 Mg Tabs (Alprazolam) .Marland Kitchen... Take 1 tablet by mouth three times a day as needed    Sertraline Hcl 100 Mg Tabs (Sertraline hcl) .Marland Kitchen... 2 by mouth once daily to increase the sertraline to 200 once daily   Problem # 4:  HYPERTENSION (ICD-401.9)  Her  updated medication list for this problem includes:    Lisinopril 20 Mg Tabs (Lisinopril) .Marland Kitchen... 1 by mouth once daily stable overall by hx and exam, ok to continue meds/tx as is   Complete Medication List: 1)  Alprazolam 0.25 Mg Tabs (Alprazolam) .... Take 1 tablet by mouth three times a day as needed 2)  Advair Diskus 100-50 Mcg/dose Misc (Fluticasone-salmeterol) .Marland Kitchen.. 1 puff two times a day 3)  Lisinopril 20 Mg Tabs (Lisinopril) .Marland Kitchen.. 1 by mouth once daily 4)  Hyoscyamine Sulfate 0.125 Mg Tbdp (Hyoscyamine sulfate) .Marland Kitchen.. 1 po qid as needed 5)  Gemfibrozil 600 Mg Tabs (Gemfibrozil) .Marland Kitchen.. 1 by mouth two times a day 6)  Ecotrin Low Strength 81 Mg Tbec (Aspirin) .Marland Kitchen.. 1 by mouth qd 7)  Sertraline Hcl 100 Mg Tabs (Sertraline hcl) .... 2 by mouth once daily  Other Orders: Tdap => 32yrs IM (56213) Admin 1st Vaccine ([address]657)  Patient Instructions: 1)  you had the tetanus shot today 2)  incresae the sertraline to 100 mg - 2 per day 3)  Continue all medications that you may have been taking previously  4)  Please go to the Lab in the basement for your blood tests today 5)  keep your yearly appts for the pap smear and mammogram 6)  You will be contacted about the referral(s) to: thyroid ultrasound 7)  Please schedule a follow-up appointment in 1 year or sooner if needed  Prescriptions: GEMFIBROZIL 600 MG  TABS (GEMFIBROZIL) 1 by mouth two times a day  #60 x 11   Entered and Authorized by:   Corwin Levins MD   Signed by:   Corwin Levins MD on 10/19/2008   Method used:   Electronically to        [facility] [address] [phone]* (retail)       [address] [place], [place]  [address]       Ph: [phone]       Fax: [phone]   RxID:   [id number] HYOSCYAMINE SULFATE 0.125 MG  TBDP (HYOSCYAMINE SULFATE) 1 po qid as needed  #60 x 5   Entered and Authorized by:   Corwin Levins MD   Signed by:   Corwin Levins MD on 10/19/2008   Method used:   Electronically to        [facility] [address] [phone]* (retail)       [address] [place], [place]  [address]       Ph: [phone]       Fax: [phone]   RxID:   [id number] LISINOPRIL 20 MG  TABS (LISINOPRIL) 1 by mouth once daily  #90 x 3   Entered and Authorized by:   Corwin Levins MD   Signed by:   Corwin Levins MD on 10/19/2008   Method used:   Electronically to        [facility] [address] [phone]* (retail)       [address] [place], [place]  [address]       Ph: [phone]       Fax: [phone]   RxID:   [id number] ADVAIR DISKUS 100-50 MCG/DOSE  MISC (FLUTICASONE-SALMETEROL) 1 puff two times a day  #1 x 11  Entered and Authorized by:   Corwin Levins MD   Signed by:   Corwin Levins MD on 10/19/2008   Method used:   Electronically to        [facility] [phone]* (retail)       [address] [place], [place]  [address]       Ph: [phone]       Fax: [phone]   RxID:   [id number] ALPRAZOLAM 0.25 MG TABS (ALPRAZOLAM) Take 1 tablet by mouth three times a day as needed  #90 x 2   Entered and Authorized by:   Corwin Levins MD   Signed by:   Corwin Levins MD on 10/19/2008   Method used:   Print then Give to Patient   RxID:   [id number] SERTRALINE HCL 100 MG TABS (SERTRALINE HCL) 2 by mouth once daily  #60 x 11   Entered and Authorized by:   Corwin Levins MD   Signed by:   Corwin Levins MD on 10/19/2008   Method used:   Electronically to        [facility] [phone]* (retail)       [address] [place], [place]  [address]       Ph: [phone]       Fax: [phone]   RxID:   [id number]    Immunizations Administered:  Tetanus Vaccine:    Vaccine Type: Tdap    Site: left deltoid    Mfr: GlaxoSmithKline    Dose: 0.5 ml    Route: IM    Given by: Windell Norfolk    Exp. Date: 07/22/2010    Lot #: [id number]   Appended Document: med refills/50/cd [facility] appt 10/22/08 @ 4:30pm (301), PT AWARE

## 2010-06-28 NOTE — Miscellaneous (Signed)
  Clinical Lists Changes  Medications: Removed medication of CITALOPRAM HYDROBROMIDE 20 MG  TABS (CITALOPRAM HYDROBROMIDE) 1 by mouth qd Added new medication of CITALOPRAM HYDROBROMIDE 40 MG  TABS (CITALOPRAM HYDROBROMIDE) 1 by mouth qd

## 2010-06-28 NOTE — Progress Notes (Signed)
Summary: Alprazolam  Phone Note From Pharmacy   Caller: [facility] [phone]* Reason for Call: Needs renewal Summary of Call: Requesting renewal on alprazolam 0.25 mg take 1 three times a day # 90 Last filled 06/14/07. Last ov 08/23/07 Initial call taken by: Orlan Leavens,  December 13, 2007 2:08 PM  Follow-up for Phone Call        done hardcopy Follow-up by: Corwin Levins MD,  December 13, 2007 2:21 PM  Additional Follow-up for Phone Call Additional follow up Details #1::        December 16, 2007 9:27 AM faxed over rx for alprazolam to [id number]* [address] [place], [place]  [address] Ph: [phone] Fax: [phone].... called to inform pt  rx was sent   no answering machine . did not leave msg  Additional Follow-up by: Shelbie Proctor,  December 16, 2007 9:28 AM    New/Updated Medications: ALPRAZOLAM 0.25 MG TABS (ALPRAZOLAM) Take 1 tablet by mouth three times a day as needed   Prescriptions: ALPRAZOLAM 0.25 MG TABS (ALPRAZOLAM) Take 1 tablet by mouth three times a day as needed  #90 x 2   Entered and Authorized by:   Corwin Levins MD   Signed by:   Corwin Levins MD on 12/13/2007   Method used:   Print then Give to Patient   RxID:   [id number] ALPRAZOLAM 0.25 MG TABS (ALPRAZOLAM) Take 1 tablet by mouth three times a day  #902 x 0   Entered and Authorized by:   Corwin Levins MD   Signed by:   Corwin Levins MD on 12/13/2007   Method used:   Print then Give to Patient   RxID:   [id number]

## 2010-06-28 NOTE — Progress Notes (Signed)
  Appt 6/10 with Dr Everardo All.  ---- Converted from flag ---- ---- 10/23/2008 7:54 AM, Migdalia Dk wrote:   ---- 10/22/2008 5:18 PM, Corwin Levins MD wrote: The following orders have been entered for this patient and placed on Admin Hold:  Type:     Referral       Code:   Endocrine Description:   Endocrinology Referral Order Date:   10/22/2008   Authorized By:   Corwin Levins MD Order #:   [id number] Clinical Notes:   dr Everardo All ------------------------------

## 2010-06-28 NOTE — Assessment & Plan Note (Signed)
Summary: FU--MED---STC   Vital Signs:  Patient profile:   50 year old female Height:      64 inches Weight:      195.75 pounds BMI:     33.72 O2 Sat:      96 % on Room air Temp:     97.6 degrees F oral Pulse rate:   66 / minute BP sitting:   140 / 82  (left arm) Cuff size:   regular  Vitals Entered By: Zella Ball Ewing CMA Duncan Dull) (December 06, 2009 8:06 AM)  O2 Flow:  Room air  Preventive Care Screening  Mammogram:    Date:  10/27/2008    Results:  abnormal   CC: followup on medications/RE   CC:  followup on medications/RE.  History of Present Illness: overall doing ok off asa but taking all of previous meds, but with persistent overwt depsite 10 lb wt los with wt watchers;  has significant daytime somnolence with any time of inacativity during the day, and snores at night;  also wakes up with headache and fatigue; and mother with same now being successfully;  saw endo with what sound like multinod goiter but has not returned for f/u u/s at 6 mo when she was aske to; now asks for repeat u/s; Pt denies CP, sob, doe, wheezing, orthopnea, pnd, worsening LE edema, palps, dizziness or syncope   Pt denies new neuro symptoms such as headache, facial or extremity weakness   No fever, wt loss, night sweats, loss of appetite or other constitutional symptoms   Problems Prior to Update: 1)  Hypersomnia  (ICD-780.54) 2)  Goiter, 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 Take 1 Tablet By Mouth Three Times A Day As Needed 2)  Advair Diskus 100-50 Mcg/dose   Misc (Fluticasone-Salmeterol) .Marland Kitchen.. 1 Puff Two Times A Day 3)  Lisinopril 20 Mg  Tabs (Lisinopril) .Marland Kitchen.. 1 By Mouth Once Daily 4)  Hyoscyamine Sulfate 0.125 Mg  Tbdp (Hyoscyamine Sulfate) .Marland Kitchen.. 1 Po Qid As Needed 5)  Gemfibrozil 600 Mg  Tabs (Gemfibrozil) .Marland Kitchen.. 1 By Mouth Two Times A Day 6)  Ecotrin Low Strength 81 Mg  Tbec (Aspirin) .Marland Kitchen.. 1 By Mouth Qd 7)  Sertraline Hcl 100 Mg Tabs (Sertraline Hcl) .... 2 By Mouth Once Daily  Current Medications (verified): 1)  Alprazolam 0.25 Mg Tabs (Alprazolam) .... Take 1 Tablet By Mouth Three Times A Day As Needed 2)  Advair Diskus 100-50 Mcg/dose  Misc (Fluticasone-Salmeterol) .Marland Kitchen.. 1 Puff Two Times A Day 3)  Lisinopril 20 Mg  Tabs (Lisinopril) .Marland Kitchen.. 1 By Mouth Once Daily 4)  Gemfibrozil 600 Mg  Tabs (Gemfibrozil) .Marland Kitchen.. 1 By Mouth Two Times A Day 5)  Ecotrin Low Strength 81 Mg  Tbec (Aspirin) .Marland Kitchen.. 1 By Mouth Qd 6)  Sertraline Hcl 100 Mg Tabs (Sertraline Hcl) .... 2 By Mouth Once Daily 7)  Simvastatin 40 Mg Tabs (Simvastatin) .Marland Kitchen.. 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 hypertriglyceridemia Hypertension Anxiety Depression glucose intolerance asthma lumbar spine disc disease hx of MVP  Past Surgical History: Cholecystectomy s/p neg breast biopsy june 2010 - [facility]  Review of Systems  The patient denies anorexia, fever, vision loss, decreased hearing, hoarseness, chest pain, syncope, dyspnea on exertion, peripheral edema, prolonged cough, headaches, hemoptysis,  abdominal pain, melena, hematochezia, severe indigestion/heartburn, hematuria, muscle weakness, suspicious skin lesions, transient blindness, difficulty walking, depression, unusual weight change, abnormal bleeding, enlarged lymph nodes, and angioedema.         all otherwise negative per pt -    Physical Exam  General:  alert and overweight-appearing.   Head:  normocephalic and atraumatic.   Eyes:  vision grossly intact, pupils equal, and pupils round.   Ears:  R ear normal and L ear normal.   Nose:  no external deformity and no nasal discharge.   Mouth:  no gingival abnormalities and pharynx pink and moist.   Neck:  supple and no masses.   Lungs:  normal respiratory effort and normal breath sounds.   Heart:  normal rate and regular rhythm.   Abdomen:  soft, non-tender, and normal bowel sounds.   Msk:  no joint tenderness and no joint swelling.   Extremities:  no edema, no erythema  Neurologic:  cranial nerves II-XII intact and strength normal in all extremities.     Impression & Recommendations:  Problem # 1:  Preventive Health Care (ICD-V70.0)  Overall doing well, age appropriate education and counseling updated and referral for appropriate preventive services done unless declined, immunizations up to date or declined, diet counseling done if overweight, urged to quit smoking if smokes , most recent labs reviewed and current ordered if appropriate, ecg reviewed or declined (interpretation per ECG scanned in the EMR if done); information regarding Medicare Prevention requirements given if appropriate; speciality referrals updated as appropriate   Orders: TLB-BMP (Basic Metabolic Panel-BMET) (80048-METABOL) TLB-CBC Platelet - w/Differential (85025-CBCD) TLB-Hepatic/Liver Function Pnl (80076-HEPATIC) TLB-Lipid Panel (80061-LIPID) TLB-TSH (Thyroid Stimulating Hormone) (84443-TSH) TLB-Udip ONLY (81003-UDIP)  Problem # 2:  HYPERSOMNIA (ICD-780.54)  refer pulm  Orders: Pulmonary  Referral (Pulmonary)  Problem # 3:  GOITER, MULTINODULAR (ICD-241.1) for thyroid u/s f/u Orders: Radiology Referral (Radiology)  Complete Medication List: 1)  Alprazolam 0.25 Mg Tabs (Alprazolam) .... Take 1 tablet by mouth three times a day as needed 2)  Advair Diskus 100-50 Mcg/dose Misc (Fluticasone-salmeterol) .Marland Kitchen.. 1 puff two times a day 3)  Lisinopril 20 Mg Tabs (Lisinopril) .Marland Kitchen.. 1 by mouth once daily 4)  Gemfibrozil 600 Mg Tabs (Gemfibrozil) .Marland Kitchen.. 1 by mouth two times a day 5)  Ecotrin Low Strength 81 Mg Tbec (Aspirin) .Marland Kitchen.. 1 by mouth qd 6)  Sertraline Hcl 100 Mg Tabs (Sertraline hcl) .... 2 by mouth once daily 7)  Simvastatin 40 Mg Tabs (Simvastatin) .Marland Kitchen.. 1po once daily  Patient Instructions: 1)  Please go to the Lab in the basement for your blood and/or urine tests today 2)  You will be contacted about the referral(s) to: thyroid u/s, and pulmonary 3)  please keep your appts for papsmear and mammogram 4)  Please schedule a follow-up appointment in 1 year or sooner if needed Prescriptions: SERTRALINE HCL 100 MG TABS (SERTRALINE HCL) 2  by mouth once daily  #180 x 3   Entered and Authorized by:   Corwin Levins MD   Signed by:   Corwin Levins MD on 12/06/2009   Method used:   Print then Give to Patient   RxID:   [id number] GEMFIBROZIL 600 MG  TABS (GEMFIBROZIL) 1 by mouth two times a day  #180 x 3   Entered and Authorized by:   Corwin Levins MD   Signed by:   Corwin Levins MD on 12/06/2009   Method used:   Print then Give to Patient   RxID:   [id number] LISINOPRIL 20 MG  TABS (LISINOPRIL) 1 by mouth once daily  #90 x 3   Entered and Authorized by:   Corwin Levins MD   Signed by:   Corwin Levins MD on 12/06/2009   Method used:   Print then Give to Patient   RxID:   [id number] ADVAIR DISKUS 100-50 MCG/DOSE  MISC (FLUTICASONE-SALMETEROL) 1 puff two times a day  #3 x 3   Entered and Authorized by:   Corwin Levins MD   Signed by:   Corwin Levins MD on 12/06/2009    Method used:   Print then Give to Patient   RxID:   [id number] ALPRAZOLAM 0.25 MG TABS (ALPRAZOLAM) Take 1 tablet by mouth three times a day as needed  #90 x 2   Entered and Authorized by:   Corwin Levins MD   Signed by:   Corwin Levins MD on 12/06/2009   Method used:   Print then Give to Patient   RxID:   [id number]

## 2010-06-28 NOTE — Progress Notes (Signed)
Summary: xanax  Phone Note Refill Request Message from:  Fax from Pharmacy on March 04, 2009 12:08 PM  Refills Requested: Medication #1:  ALPRAZOLAM 0.25 MG TABS Take 1 tablet by mouth three times a day as needed # 90   Last Refilled: 11/16/2008 Initial call taken by: Orlan Leavens,  March 04, 2009 12:09 PM  Follow-up for Phone Call        done hardcopy to [facility] - dahlia  Follow-up by: Corwin Levins MD,  March 04, 2009 3:29 PM  Additional Follow-up for Phone Call Additional follow up Details #1::        rx faxed to [facility] pharmacy Additional Follow-up by: Margaret Pyle, CMA,  March 04, 2009 4:00 PM    New/Updated Medications: ALPRAZOLAM 0.25 MG TABS (ALPRAZOLAM) Take 1 tablet by mouth three times a day as needed Prescriptions: ALPRAZOLAM 0.25 MG TABS (ALPRAZOLAM) Take 1 tablet by mouth three times a day as needed  #90 x 2   Entered and Authorized by:   Corwin Levins MD   Signed by:   Corwin Levins MD on 03/04/2009   Method used:   Print then Give to Patient   RxID:   [id number]

## 2010-06-28 NOTE — Assessment & Plan Note (Signed)
Summary: COLD/ $50 /NWS   Vital Signs:  Patient Profile:   50 Years Old Female Weight:      172 pounds Temp:     99.3 degrees F oral Pulse rate:   112 / minute BP sitting:   152 / 92  (right arm) Cuff size:   regular  Pt. in pain?   no  Vitals Entered By: Maris Berger (August 23, 2007 2:42 PM)                  Chief Complaint:  head cold.  History of Present Illness: here with acute onset prod cough with mild sob but no wheezing, fever for 2 days    Updated Prior Medication List: ALPRAZOLAM 0.25 MG TABS (ALPRAZOLAM) Take 1 tablet by mouth three times a day WELLBUTRIN XL 300 MG  TB24 (BUPROPION HCL) 1 by mouth qd CITALOPRAM HYDROBROMIDE 20 MG  TABS (CITALOPRAM HYDROBROMIDE) 1 by mouth qd ADVAIR DISKUS 100-50 MCG/DOSE  MISC (FLUTICASONE-SALMETEROL)  LISINOPRIL 20 MG  TABS (LISINOPRIL) 1 by mouth qd HYOSCYAMINE SULFATE 0.125 MG  TBDP (HYOSCYAMINE SULFATE) 1 po qid prn GEMFIBROZIL 600 MG  TABS (GEMFIBROZIL) 1 by mouth bid ECOTRIN LOW STRENGTH 81 MG  TBEC (ASPIRIN) 1 by mouth qd AZITHROMYCIN 250 MG  TABS (AZITHROMYCIN) 2po qd for 1 day, then 1po qd for 4days, then stop PROMETHAZINE-CODEINE 6.25-10 MG/5ML  SYRP (PROMETHAZINE-CODEINE) 1 tsp by mouth qid prn  Current Allergies (reviewed today): No known allergies   Past Medical History:    Reviewed history from 06/17/2007 and no changes required:       hypertriglyceridemia       Hypertension       Anxiety       Depression       glucose intolerance       asthma       lumbar spine disc disease       hx of MVP  Past Surgical History:    Reviewed history from 06/17/2007 and no changes required:       Cholecystectomy   Family History:    Reviewed history from 06/17/2007 and no changes required:       father with CAD, HTN, DM       mother with HTN, DM       uncle with ETOH abuse       aunt with TB  Social History:    Reviewed history from 06/17/2007 and no changes required:       Never Smoked  Alcohol use-no       Married       2 children     Physical Exam  General:     mild ill Head:     Normocephalic and atraumatic without obvious abnormalities. No apparent alopecia or balding. Eyes:     No corneal or conjunctival inflammation noted. EOMI. Perrla.  Ears:     bilat tm's red, sinus nontender Nose:     nasal dischargemucosal pallor.   Mouth:     pharyngeal erythema.   Neck:     cervical lymphadenopathy.   Lungs:     Normal respiratory effort, chest expands symmetrically. Lungs are clear to auscultation, no crackles or wheezes. Heart:     Normal rate and regular rhythm. S1 and S2 normal without gallop, murmur, click, rub or other extra sounds. Extremities:     no edema, no ulcers     Impression & Recommendations:  Problem # 1:  BRONCHITIS-ACUTE (ICD-466.0)  Her updated medication  list for this problem includes:    Advair Diskus 100-50 Mcg/dose Misc (Fluticasone-salmeterol)    Azithromycin 250 Mg Tabs (Azithromycin) .Marland Kitchen... 2po qd for 1 day, then 1po qd for 4days, then stop    Promethazine-codeine 6.25-10 Mg/8ml Syrp (Promethazine-codeine) .Marland Kitchen... 1 tsp by mouth qid prn treat as above, f/u any worsening signs or symptoms   Problem # 2:  HYPERTENSION (ICD-401.9)  Her updated medication list for this problem includes:    Lisinopril 20 Mg Tabs (Lisinopril) .Marland Kitchen... 1 by mouth qd  BP today: 152/92 Prior BP: 144/98 (06/17/2007)  Labs Reviewed: Creat: 0.8 (08/23/2006) Chol: 193 (08/23/2006)   HDL: 41.9 (08/23/2006)   LDL: DEL (08/23/2006)   TG: 391 (08/23/2006)  mild elev - likely situational, ok to follow  Complete Medication List: 1)  Alprazolam 0.25 Mg Tabs (Alprazolam) .... Take 1 tablet by mouth three times a day 2)  Wellbutrin Xl 300 Mg Tb24 (Bupropion hcl) .Marland Kitchen.. 1 by mouth qd 3)  Citalopram Hydrobromide 20 Mg Tabs (Citalopram hydrobromide) .Marland Kitchen.. 1 by mouth qd 4)  Advair Diskus 100-50 Mcg/dose Misc (Fluticasone-salmeterol) 5)  Lisinopril 20 Mg Tabs  (Lisinopril) .Marland Kitchen.. 1 by mouth qd 6)  Hyoscyamine Sulfate 0.125 Mg Tbdp (Hyoscyamine sulfate) .Marland Kitchen.. 1 po qid prn 7)  Gemfibrozil 600 Mg Tabs (Gemfibrozil) .Marland Kitchen.. 1 by mouth bid 8)  Ecotrin Low Strength 81 Mg Tbec (Aspirin) .Marland Kitchen.. 1 by mouth qd 9)  Azithromycin 250 Mg Tabs (Azithromycin) .... 2po qd for 1 day, then 1po qd for 4days, then stop 10)  Promethazine-codeine 6.25-10 Mg/77ml Syrp (Promethazine-codeine) .Marland Kitchen.. 1 tsp by mouth qid prn   Patient Instructions: 1)  take all medications as prescribed 2)  continue all medications that you may have been taking previously 3)  Please schedule a follow-up appointment as recommended at last visit    Prescriptions: PROMETHAZINE-CODEINE 6.25-10 MG/5ML  SYRP (PROMETHAZINE-CODEINE) 1 tsp by mouth qid prn  #8 oz x 1   Entered and Authorized by:   Corwin Levins MD   Signed by:   Corwin Levins MD on 08/23/2007   Method used:   Print then Give to Patient   RxID:   [id number] AZITHROMYCIN 250 MG  TABS (AZITHROMYCIN) 2po qd for 1 day, then 1po qd for 4days, then stop  #6 x 1   Entered and Authorized by:   Corwin Levins MD   Signed by:   Corwin Levins MD on 08/23/2007   Method used:   Print then Give to Patient   RxID:   [id number]  ]

## 2010-06-28 NOTE — Progress Notes (Signed)
Summary: needing to reschedule pt's appt  Phone Note Outgoing Call Call back at [facility] Phone [phone]   Call placed by: Arman Filter LPN,  January 06, 2010 5:20 PM Call placed to: Patient Summary of Call: pt is currently scheduled to see [facility] as a sleep consult at 4pm on Wednesday August 17 at 4:00pm.  I had ok'd this appt time before I checked with KC.  And unfortunately, KC has a meeting scheduled that day at that time.  Therefore, will need to reschedule pt.  I held a consult slot on wed August 17 at 11:00am.  will see if she can come then.  Aundra Millet Stephan Nelis LPN  January 06, 2010 5:25 PM  Initial call taken by: Arman Filter LPN,  January 06, 2010 5:25 PM  Follow-up for Phone Call        Aundra Millet,  pt cancelled appt and said once she gets her school schedule, she would call us back to reschedule appt.  Called and LMovm for pt to call back & let me know if she wanted 11:00am on 01/12/2010. Follow-up by: Eugene Gavia,  January 10, 2010 3:48 PM  Additional Follow-up for Phone Call Additional follow up Details #1::        I'm going to sign off on this message and wait for pt to call back to schedule consult appt.  Aundra Millet Bing Duffey LPN  January 11, 2010 10:49 AM

## 2010-06-28 NOTE — Op Note (Signed)
Summary: Breast US Bx procedure/[facility]  Breast US Bx procedure/[facility]   Imported By: Lester Oxford 11/12/2008 08:03:23  _____________________________________________________________________  External Attachment:    Type:   Image     Comment:   External Document

## 2010-06-28 NOTE — Assessment & Plan Note (Signed)
Summary: POST HOSP /NWS   Vital Signs:  Patient Profile:   50 Years Old Female Weight:      180 pounds Temp:     98.6 degrees F oral Pulse rate:   94 / minute BP sitting:   144 / 98  (right arm) Cuff size:   regular  Pt. in pain?   no  Vitals Entered By: Maris Berger (June 17, 2007 10:27 AM)                  Chief Complaint:  HFU.  History of Present Illness: awoke with left side ch 2 wks ago, next day when recurred went to [facility] er eventually., multiple tests neg in house, f/u outpt stress test neg;  did have one episode of left side cp since then, but actually none for the past wk;   Current Allergies (reviewed today): No known allergies  Updated/Current Medications (including changes made in today's visit):  ALPRAZOLAM 0.25 MG TABS (ALPRAZOLAM) Take 1 tablet by mouth three times a day WELLBUTRIN XL 300 MG  TB24 (BUPROPION HCL) 1 by mouth qd CITALOPRAM HYDROBROMIDE 20 MG  TABS (CITALOPRAM HYDROBROMIDE) 1 by mouth qd ADVAIR DISKUS 100-50 MCG/DOSE  MISC (FLUTICASONE-SALMETEROL)  LISINOPRIL 20 MG  TABS (LISINOPRIL) 1 by mouth qd HYOSCYAMINE SULFATE 0.125 MG  TBDP (HYOSCYAMINE SULFATE) 1 po qid prn GEMFIBROZIL 600 MG  TABS (GEMFIBROZIL) 1 by mouth bid ECOTRIN LOW STRENGTH 81 MG  TBEC (ASPIRIN) 1 by mouth qd   Past Medical History:    Reviewed history and no changes required:       hypertriglyceridemia       Hypertension       Anxiety       Depression       glucose intolerance       asthma       lumbar spine disc disease       hx of MVP  Past Surgical History:    Reviewed history and no changes required:       Cholecystectomy   Family History:    Reviewed history and no changes required:       father with CAD, HTN, DM       mother with HTN, DM       uncle with ETOH abuse       aunt with TB  Social History:    Reviewed history and no changes required:       Never Smoked       Alcohol use-no       Married       2 children   Risk  Factors:  Tobacco use:  never Alcohol use:  no   Review of Systems  The patient denies anorexia, fever, weight loss, vision loss, decreased hearing, hoarseness, chest pain, syncope, dyspnea on exhertion, peripheral edema, prolonged cough, hemoptysis, abdominal pain, melena, hematochezia, severe indigestion/heartburn, hematuria, incontinence, genital sores, muscle weakness, suspicious skin lesions, transient blindness, difficulty walking, depression, unusual weight change, abnormal bleeding, enlarged lymph nodes, angioedema, breast masses, and testicular masses.     Physical Exam  General:     Well-developed,well-nourished,in no acute distress; alert,appropriate and cooperative throughout examination Head:     Normocephalic and atraumatic without obvious abnormalities. No apparent alopecia or balding. Eyes:     No corneal or conjunctival inflammation noted. EOMI. Perrla. Funduscopic exam benign, without hemorrhages, exudates or papilledema. Vision grossly normal. Ears:     External ear exam shows no significant lesions or deformities.  Otoscopic examination reveals clear canals, tympanic membranes are intact bilaterally without bulging, retraction, inflammation or discharge. Hearing is grossly normal bilaterally. Nose:     External nasal examination shows no deformity or inflammation. Nasal mucosa are pink and moist without lesions or exudates. Mouth:     Oral mucosa and oropharynx without lesions or exudates.  Teeth in good repair. Neck:     No deformities, masses, or tenderness noted. Lungs:     Normal respiratory effort, chest expands symmetrically. Lungs are clear to auscultation, no crackles or wheezes. Heart:     Normal rate and regular rhythm. S1 and S2 normal without gallop, murmur, click, rub or other extra sounds. Abdomen:     Bowel sounds positive,abdomen soft and non-tender without masses, organomegaly or hernias noted. Msk:     No deformity or scoliosis noted of thoracic  or lumbar spine.   Extremities:     No clubbing, cyanosis, edema, or deformity noted with normal full range of motion of all joints.   Neurologic:     No cranial nerve deficits noted. Station and gait are normal. Plantar reflexes are down-going bilaterally. DTRs are symmetrical throughout. Sensory, motor and coordinative functions appear intact.    Impression & Recommendations:  Problem # 1:  CHEST PAIN (ICD-786.50) unclear etiology but suspicsiou for esoph spams - try hyoscyamine as needed trial  Problem # 2:  HYPERTRIGLYCERIDEMIA (ICD-272.1) start lopid bid Her updated medication list for this problem includes:    Gemfibrozil 600 Mg Tabs (Gemfibrozil) .Marland Kitchen... 1 by mouth bid   Problem # 3:  Preventive Health Care (ICD-V70.0) Overall doing well, up to date, counseled on routine health concerns for screening and prevention, immunizations up to date or declined, labs reviewed from inpt stay  Complete Medication List: 1)  Alprazolam 0.25 Mg Tabs (Alprazolam) .... Take 1 tablet by mouth three times a day 2)  Wellbutrin Xl 300 Mg Tb24 (Bupropion hcl) .Marland Kitchen.. 1 by mouth qd 3)  Citalopram Hydrobromide 20 Mg Tabs (Citalopram hydrobromide) .Marland Kitchen.. 1 by mouth qd 4)  Advair Diskus 100-50 Mcg/dose Misc (Fluticasone-salmeterol) 5)  Lisinopril 20 Mg Tabs (Lisinopril) .Marland Kitchen.. 1 by mouth qd 6)  Hyoscyamine Sulfate 0.125 Mg Tbdp (Hyoscyamine sulfate) .Marland Kitchen.. 1 po qid prn 7)  Gemfibrozil 600 Mg Tabs (Gemfibrozil) .Marland Kitchen.. 1 by mouth bid 8)  Ecotrin Low Strength 81 Mg Tbec (Aspirin) .Marland Kitchen.. 1 by mouth qd   Patient Instructions: 1)  Take an Aspirin every day - 1 per day - COATED 2)  please start the increased lisinopril dose at 20 mg 3)  Check your Blood Pressure regularly. If it is above 140/90: you should make an appointment. 4)  please take the new hyoscyamine for any chest pains 5)  Please schedule a follow-up appointment in 6 months with Lipids 272.0    Prescriptions: GEMFIBROZIL 600 MG  TABS (GEMFIBROZIL) 1  by mouth bid  #60 x 11   Entered and Authorized by:   Corwin Levins MD   Signed by:   Corwin Levins MD on 06/17/2007   Method used:   Electronically sent to ...       [facility]  [id number]*       [address] [place], [place]  [address]       Ph: [phone]       Fax: [phone]   RxID:   [id number] HYOSCYAMINE SULFATE 0.125 MG  TBDP (HYOSCYAMINE SULFATE) 1 po  qid prn  #60 x 5   Entered and Authorized by:   Corwin Levins MD   Signed by:   Corwin Levins MD on 06/17/2007   Method used:   Electronically sent to ...       [facility]  [id number]*       [address] [place], [place]  [address]       Ph: [phone]       Fax: [phone]   RxID:   [id number] LISINOPRIL 20 MG  TABS (LISINOPRIL) 1 by mouth qd  #90 x 3   Entered and Authorized by:   Corwin Levins MD   Signed by:   Corwin Levins MD on 06/17/2007   Method used:   Electronically sent to ...       [facility]  Pharmacy #[id number]*       [address] [place], [place]  [address]       Ph: [phone]       Fax: [phone]   RxID:   [id number]  ]

## 2010-06-28 NOTE — Assessment & Plan Note (Signed)
Summary: EXPOSED TO H1N1/ NO FEVER/COUGH/NO BODY ACHES OR SORE THROAT/NWS   Vital Signs:  Patient Profile:   50 Years Old Female Weight:      186 pounds O2 Sat:      94 % O2 treatment:    Room Air Temp:     99.0 degrees F oral Pulse rate:   100 / minute BP sitting:   138 / 86  (left arm) Cuff size:   regular  Vitals Entered By: Payton Spark CMA (March 16, 2008 1:41 PM)                 Chief Complaint:  exposed to H1N1.  History of Present Illness: missed her f/u appt 7/09 after she had her lipids done due to broken foot after trip down the stairs; here today with acute onset URI symptoms now moved to the chest with severe cough, prod of greenish sputum, with wheezing and mild sob/doe    Updated Prior Medication List: ALPRAZOLAM 0.25 MG TABS (ALPRAZOLAM) Take 1 tablet by mouth three times a day as needed ADVAIR DISKUS 100-50 MCG/DOSE  MISC (FLUTICASONE-SALMETEROL)  LISINOPRIL 20 MG  TABS (LISINOPRIL) 1 by mouth qd HYOSCYAMINE SULFATE 0.125 MG  TBDP (HYOSCYAMINE SULFATE) 1 po qid prn GEMFIBROZIL 600 MG  TABS (GEMFIBROZIL) 1 by mouth bid ECOTRIN LOW STRENGTH 81 MG  TBEC (ASPIRIN) 1 by mouth qd AZITHROMYCIN 250 MG  TABS (AZITHROMYCIN) 2po qd for 1 day, then 1po qd for 4days, then stop PROMETHAZINE-CODEINE 6.25-10 MG/5ML  SYRP (PROMETHAZINE-CODEINE) 1 tsp by mouth q6hrs as needed PRISTIQ 50 MG XR24H-TAB (DESVENLAFAXINE SUCCINATE) 1 by mouth once daily PREDNISONE 10 MG TABS (PREDNISONE) 4po qd for 3days, then 3po qd for 3days, then 2po qd for 3days, then 1po qd for 3 days, then stop  Current Allergies (reviewed today): No known allergies   Past Medical History:    Reviewed history from 06/17/2007 and no changes required:       hypertriglyceridemia       Hypertension       Anxiety       Depression       glucose intolerance       asthma       lumbar spine disc disease       hx of MVP  Past Surgical History:    Reviewed history from 06/17/2007 and no changes  required:       Cholecystectomy   Social History:    Reviewed history from 06/17/2007 and no changes required:       Never Smoked       Alcohol use-no       Married       2 children    Review of Systems       all otherwise negative    Physical Exam  General:     alert and overweight-appearing (gained her 20 lbs back), mild ill, no resp distress however  Head:     Normocephalic and atraumatic without obvious abnormalities. No apparent alopecia or balding. Eyes:     No corneal or conjunctival inflammation noted. EOMI. Perrla.  Ears:     External ear exam shows no significant lesions or deformities.  Otoscopic examination reveals clear canals, tympanic membranes are intact bilaterally without bulging, retraction, inflammation or discharge. Hearing is grossly normal bilaterally. Nose:     External nasal examination shows no deformity or inflammation. Nasal mucosa are pink and moist without lesions or exudates. Mouth:     Oral mucosa and oropharynx  without lesions or exudates.  Teeth in good repair. Neck:     No deformities, masses, or tenderness noted. Lungs:     R decreased breath sounds, R wheezes, L decreased breath sounds, and L wheezes.   Heart:     Normal rate and regular rhythm. S1 and S2 normal without gallop, murmur, click, rub or other extra sounds. Extremities:     no edema, no ulcers     Impression & Recommendations:  Problem # 1:  ASTHMATIC BRONCHITIS, ACUTE (ICD-466.0)  Her updated medication list for this problem includes:    Advair Diskus 100-50 Mcg/dose Misc (Fluticasone-salmeterol)    Azithromycin 250 Mg Tabs (Azithromycin) .Marland Kitchen... 2po qd for 1 day, then 1po qd for 4days, then stop    Promethazine-codeine 6.25-10 Mg/43ml Syrp (Promethazine-codeine) .Marland Kitchen... 1 tsp by mouth q6hrs as needed  Orders: Pulse Oximetry, Ambulatory (03474)   Problem # 2:  HYPERTRIGLYCERIDEMIA (ICD-272.1)  Her updated medication list for this problem includes:    Gemfibrozil  600 Mg Tabs (Gemfibrozil) .Marland Kitchen... 1 by mouth bid d/w pt - improved recent labs, cont meds as is, keep trying to lose wt Labs Reviewed: Chol: 180 (12/09/2007)   HDL: 34.8 (12/09/2007)   LDL: DEL (12/09/2007)   TG: 269 (12/09/2007) SGOT: 20 (08/23/2006)   SGPT: 24 (08/23/2006)   Problem # 3:  DEPRESSION (ICD-311)  The following medications were removed from the medication list:    Wellbutrin Xl 300 Mg Tb24 (Bupropion hcl) .Marland Kitchen... 1 by mouth qd  Her updated medication list for this problem includes:    Alprazolam 0.25 Mg Tabs (Alprazolam) .Marland Kitchen... Take 1 tablet by mouth three times a day as needed    Pristiq 50 Mg Xr24h-tab (Desvenlafaxine succinate) .Marland Kitchen... 1 by mouth once daily change to the pristiz per pt request, f/u in 4 wks if no better  Problem # 4:  HYPERTENSION (ICD-401.9) Assessment: Comment Only  Her updated medication list for this problem includes:    Lisinopril 20 Mg Tabs (Lisinopril) .Marland Kitchen... 1 by mouth qd  BP today: 138/86 Prior BP: 152/92 (08/23/2007)  Labs Reviewed: Creat: 0.8 (08/23/2006) Chol: 180 (12/09/2007)   HDL: 34.8 (12/09/2007)   LDL: DEL (12/09/2007)   TG: 269 (12/09/2007) stable overall by hx and exam, ok to continue meds/tx as is   Complete Medication List: 1)  Alprazolam 0.25 Mg Tabs (Alprazolam) .... Take 1 tablet by mouth three times a day as needed 2)  Advair Diskus 100-50 Mcg/dose Misc (Fluticasone-salmeterol) 3)  Lisinopril 20 Mg Tabs (Lisinopril) .Marland Kitchen.. 1 by mouth qd 4)  Hyoscyamine Sulfate 0.125 Mg Tbdp (Hyoscyamine sulfate) .Marland Kitchen.. 1 po qid prn 5)  Gemfibrozil 600 Mg Tabs (Gemfibrozil) .Marland Kitchen.. 1 by mouth bid 6)  Ecotrin Low Strength 81 Mg Tbec (Aspirin) .Marland Kitchen.. 1 by mouth qd 7)  Azithromycin 250 Mg Tabs (Azithromycin) .... 2po qd for 1 day, then 1po qd for 4days, then stop 8)  Promethazine-codeine 6.25-10 Mg/27ml Syrp (Promethazine-codeine) .Marland Kitchen.. 1 tsp by mouth q6hrs as needed 9)  Pristiq 50 Mg Xr24h-tab (Desvenlafaxine succinate) .Marland Kitchen.. 1 by mouth once daily 10)   Prednisone 10 Mg Tabs (Prednisone) .... 4po qd for 3days, then 3po qd for 3days, then 2po qd for 3days, then 1po qd for 3 days, then stop   Patient Instructions: 1)  stop the citalopram 2)  stop the wellbutrin 3)  start the pristiq 50 mg - 1 per day 4)  return in 4 wks if this does not seem to do well 5)  call if you would like  to be referred for counseling 6)  you received the steroid shot today 7)  Please take all new medications as prescribed  - the antibiotic, cough med, and the prednisone 8)  Please schedule a follow-up appointment in 6 months with CPX labs and : 9)  HbgA1C prior to visit, ICD-9: 790.2   Prescriptions: PREDNISONE 10 MG TABS (PREDNISONE) 4po qd for 3days, then 3po qd for 3days, then 2po qd for 3days, then 1po qd for 3 days, then stop  #30 x 0   Entered and Authorized by:   Corwin Levins MD   Signed by:   Corwin Levins MD on 03/16/2008   Method used:   Print then Give to Patient   RxID:   [id number] AZITHROMYCIN 250 MG  TABS (AZITHROMYCIN) 2po qd for 1 day, then 1po qd for 4days, then stop  #6 x 1   Entered and Authorized by:   Corwin Levins MD   Signed by:   Corwin Levins MD on 03/16/2008   Method used:   Print then Give to Patient   RxID:   [id number] PROMETHAZINE-CODEINE 6.25-10 MG/5ML  SYRP (PROMETHAZINE-CODEINE) 1 tsp by mouth q6hrs as needed  #6 oz x 1   Entered and Authorized by:   Corwin Levins MD   Signed by:   Corwin Levins MD on 03/16/2008   Method used:   Print then Give to Patient   RxID:   [id number] PRISTIQ 50 MG XR24H-TAB (DESVENLAFAXINE SUCCINATE) 1 by mouth once daily  #30 x 11   Entered and Authorized by:   Corwin Levins MD   Signed by:   Corwin Levins MD on 03/16/2008   Method used:   Print then Give to Patient   RxID:   [id number]  ]  Appended Document: Orders Update    Clinical Lists Changes  Orders: Added new Service order of Depo- Medrol 40mg  (J1030) - Signed Added new Service order of Depo- Medrol 80mg  (J1040)  - Signed Added new Service order of Admin of Therapeutic Inj  intramuscular or subcutaneous ([id number]) - Signed       Medication Administration  Injection # 1:    Medication: Depo- Medrol 80mg     Diagnosis: ASTHMATIC BRONCHITIS, ACUTE (ICD-466.0)    Route: IM    Site: LUOQ gluteus    Exp Date: 04/27/2009    Lot #: [id number]    Mfr: [email or web address]    Patient tolerated injection without complications    Given by: Payton Spark CMA (March 16, 2008 2:20 PM)  Injection # 2:    Medication: Depo- Medrol 40mg     Diagnosis: ASTHMATIC BRONCHITIS, ACUTE (ICD-466.0)    Route: IM    Site: LUOQ gluteus    Exp Date: 04/27/2009    Lot #: [id number]    Mfr: [email or web address]    Patient tolerated injection without complications    Given by: Payton Spark CMA (March 16, 2008 2:20 PM)  Orders Added: 1)  Depo- Medrol 40mg  [J1030] 2)  Depo- Medrol 80mg  [J1040] 3)  Admin of Therapeutic Inj  intramuscular or subcutaneous [[id number]]

## 2010-06-28 NOTE — Progress Notes (Signed)
Summary: ALPRAZOLAM  Phone Note Refill Request Message from:  Fax from Pharmacy on August 26, 2008 1:42 PM  Refills Requested: Medication #1:  ALPRAZOLAM 0.25 MG TABS Take 1 tablet by mouth three times a day as needed # 90   Last Refilled: 03/23/2008 [facility] [phone] LAST APPT 03/16/08  Next Appointment Scheduled: NONE  Follow-up for Phone Call        PLS ADVISE SINCE DR. JOHN IS NOT IN OFFICE  Follow-up by: Orlan Leavens,  August 26, 2008 1:45 PM  Additional Follow-up for Phone Call Additional follow up Details #1::        OK x1 Needs OV w/Dr Jonny Ruiz Additional Follow-up by: Tresa Garter MD,  August 26, 2008 5:05 PM    Additional Follow-up for Phone Call Additional follow up Details #2::    called pt to inform rx was sent to pharmacy  Follow-up by: Shelbie Proctor,  August 27, 2008 3:56 PM    Prescriptions: ALPRAZOLAM 0.25 MG TABS (ALPRAZOLAM) Take 1 tablet by mouth three times a day as needed  #90 x 1   Entered by:   Lamar Sprinkles   Authorized by:   Corwin Levins MD   Signed by:   Lamar Sprinkles on 08/27/2008   Method used:   Telephoned to ...       [facility] [phone]* (retail)       [address] [place], [place]  [address]       Ph: [phone]       Fax: [phone]   RxID:   [id number]

## 2010-06-28 NOTE — Assessment & Plan Note (Signed)
Summary: new endo/dr john/thyroid cysts/cd   Vital Signs:  Patient profile:   50 year old female Height:      64 inches Weight:      193 pounds Temp:     97.5 degrees F oral Pulse rate:   124 / minute BP sitting:   122 / 90  (left arm) Cuff size:   regular  Vitals Entered By: Bill Salinas CMA (November 05, 2008 3:47 PM) CC: New endo consult for thyroid cyst/ ab   Referring Provider:  Jonny Ruiz  CC:  New endo consult for thyroid cyst/ ab.  History of Present Illness: pt states 1-2 mos of intermittent moderate "choking" sensation, but no associated neck pain.   she sometimes awakens in the middle of the night with choking and vomiting.  Current Medications (verified): 1)  Alprazolam 0.25 Mg Tabs (Alprazolam) .... Take 1 Tablet By Mouth Three Times A Day As Needed 2)  Advair Diskus 100-50 Mcg/dose  Misc (Fluticasone-Salmeterol) .Marland Kitchen.. 1 Puff Two Times A Day 3)  Lisinopril 20 Mg  Tabs (Lisinopril) .Marland Kitchen.. 1 By Mouth Once Daily 4)  Hyoscyamine Sulfate 0.125 Mg  Tbdp (Hyoscyamine Sulfate) .Marland Kitchen.. 1 Po Qid As Needed 5)  Gemfibrozil 600 Mg  Tabs (Gemfibrozil) .Marland Kitchen.. 1 By Mouth Two Times A Day 6)  Ecotrin Low Strength 81 Mg  Tbec (Aspirin) .Marland Kitchen.. 1 By Mouth Qd 7)  Sertraline Hcl 100 Mg Tabs (Sertraline Hcl) .... 2 By Mouth Once Daily  Allergies (verified): No Known Drug Allergies  Past History:  Past Medical History: Last updated: 06/17/2007 hypertriglyceridemia Hypertension Anxiety Depression glucose intolerance asthma lumbar spine disc disease hx of MVP  Family History: Reviewed history from 06/17/2007 and no changes required. father with CAD, HTN, DM mother with HTN, DM uncle with ETOH abuse aunt with TB sister has uncertain type of thyroid problem  Social History: Reviewed history from 06/17/2007 and no changes required. 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         denies weight  loss, hoarseness, double vision, palpitations, sob, diarrhea, polyuria, numbness, hypoglycemia, rhinorrhea. pt has some solid dysphagia, fatigue, myalgias, tremor, easy bruising, and frequent excessive diaphoresis.  medication helps anxiety and depression  Physical Exam  General:  normal appearance.   Head:  head: no deformity eyes: no periorbital swelling, no proptosis external nose and ears are normal mouth: no lesion seen  Neck:  i do not appreciate a goiter Lungs:  Clear to auscultation bilaterally. Normal respiratory effort.  Heart:  Regular rate and rhythm without murmurs or gallops noted. Normal S1,S2.   Msk:  muscle bulk and strength are grossly mormal.  no obvious joint swelling.  gait is normal and steady  Extremities:  no deformity.  no edema Neurologic:  cn 2-12 grossly intact.   readily moves all 4's.    Skin:  normal texture and temp.  no rash.  not diaphoretic  Cervical Nodes:  No significant adenopathy.  Psych:  Alert and cooperative; normal mood and affect; normal attention span and concentration.   Additional Exam:  outside test results are reviewed:  THYROID ULTRASOUND   Multiple bilateral complex solid and cystic thyroid nodules.  The dominant nodule is in the upper pole of the left lobe.  largest is 16x10x6 mm  tsh=1.54   Impression & Recommendations:  Problem # 1:  GOITER, MULTINODULAR (ICD-241.1) Assessment New  Problem # 2:  "choking" goiter  is too small to account for this.  Problem # 3:  ANXIETY (ICD-300.00) and depression, not thyroid-related  Other Orders: Consultation Level IV (16109)  Patient Instructions: 1)  we discussed the natural hx of nodular thyroid disease, which is usually the slow development of hyperthyroidism 2)  return 6 mos, when she would most likely be due tof a f/u US. 3)  we discussed the fact the her symptoms are not thyroid-related

## 2010-06-28 NOTE — Progress Notes (Signed)
Summary: results needed  Phone Note Call from Patient Call back at Home Phone [phone]   Caller: Patient Call For: Corwin Levins MD Summary of Call: per Rosiland Oz call need her breast  US report Initial call taken by: Shelbie Proctor,  November 12, 2008 1:42 PM  Follow-up for Phone Call        very sorry, but I dont have the result at this time as I am out of the office;  if I understand what she means, she is wanting her breast biopsy result ----    I have checked the hospital echart but I dont have access to it;  until I return to the office tomorrow, I would suggest she call the people who performed the biopsy, who are really the ones responsible for getting her the results Follow-up by: Corwin Levins MD,  November 12, 2008 3:51 PM  Additional Follow-up for Phone Call Additional follow up Details #1::        called pt no answer. left msg to call dr Jonny Ruiz office  Additional Follow-up by: Shelbie Proctor,  November 12, 2008 3:55 PM    Additional Follow-up for Phone Call Additional follow up Details #2::    pt states she  where she had the breast biopsy could not give her the results she stated Dr Jonny Ruiz would have to provided her with them Sherry White was informed dr Jonny Ruiz ws out of the office , she stated she would wait on dr Jonny Ruiz to provided  her with the results  when he returns on friday  Follow-up by: Shelbie Proctor,  November 13, 2008 8:08 AM  Additional Follow-up for Phone Call Additional follow up Details #3:: Details for Additional Follow-up Action Taken: please obtain results then as I am not sure where this was done or how we get the results Additional Follow-up by: Corwin Levins MD,  November 13, 2008 10:22 AM      I have now received the pathology report - NO CANCER -   pt asked to f/u with routine mammogram in 1 yr  Dr Jonny Ruiz   -Sherry White pt daughter answered and stated to call her on her cell at [phone]....called pt and made her aware of info and pt states she is f/u  with mamm every year and thank you ...Marland KitchenMarland KitchenWindell Norfolk  November 13, 2008 1:37 PM

## 2010-10-11 NOTE — H&P (Signed)
Sherry White, KRAVITZ           ACCOUNT NO.:  [id number]   MEDICAL RECORD NO.:  [id number]           PATIENT TYPE:   LOCATION:                                 FACILITY:   PHYSICIAN:  Sherry White, MDDATE OF BIRTH:  Mar 28, 1961   DATE OF ADMISSION:  06/04/2007  DATE OF DISCHARGE:                              HISTORY & PHYSICAL   PRIMARY CARE PHYSICIAN:  Sherry Levins, MD.   CHIEF COMPLAINT:  Chest tightness x2 days.   HISTORY OF PRESENT ILLNESS:  Ms. Sherry White is a pleasant 50 year old  overweight female with past medical history of treated hypertension who  presents to the emergency room due to 2 episodes of substernal left-  sided chest tightness.  She reports pain yesterday in the left side of  her chest radiating to the left arm which woke her from sleep around  4:00 a.m..  The pain last 5-10 minutes that she lay there in bed and  then resolved without intervention. She denied any associated nausea,  vomiting, shortness of breath or diaphoresis.  No radiation to neck or  back. No previous symptoms of such pain.  The day then proceeded without  further events, and she slept well without problems the following night.  Today while driving to work, the pain occurred again, still substernal  with tightness but did not subside.  No radiation to her arm today as  well as no other associated symptoms such as nausea and vomiting.  She  has had 1-week history of heartburn that is worse when she is lying  down but is also different from her currently described symptoms.  She  has attributed this increased heartburn to recent holiday food.  On  arrival to the emergency room, she reported her pain 4/10, having been  given a nitroglycerin tablet via EMS en route. She was begun on IV  nitroglycerin by the ED physician, and currently her chest pain has  resolved at 0/10. She denies any previous headache (since beginning  nitroglycerin mild headache 1/10).  No blurred vision,  shortness of  breath, cough, recent fever, illness, or fatigue.   PAST MEDICAL HISTORY:  1. Hypertension.  2. Mitral valve prolapse.  No previous cardiac evaluation.  3. Anxiety and depression.  4. Status post cholecystectomy.  5. Remote hernia repair.  6. Reported history of hypertriglyceridemia when last checked, but no      specific dyslipidemia.   No history of hypertension.   FAMILY HISTORY:  Significant for coronary disease along the paternal  side of her family, multiple uncles as well as her father who had his  first MI in his late 12s, had bypass surgery after another MI in his  68s.  He is living.  Mother also living at age 44 with history of  hypertension and diabetes.   SOCIAL HISTORY:  She lives with her husband as well as her 36 and 48-  year-old daughters.  They have been in [place] many years, having  previously lived in [place], [place].  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  She does not smoke or  drink.   CURRENT MEDICATIONS:  1. Lisinopril 10 mg daily.  2. Wellbutrin XL 300 mg daily.  3. Celexa 40 mg daily.  4. Xanax 0.25 mg 3 times a day p.r.n.   ALLERGIES:  No known drug allergies.   REVIEW OF SYSTEMS:  As stated in HPI above.  Otherwise, this is  negative.  Her last menstrual cycle began January 5 and is regular.   PHYSICAL EXAMINATION:  VITAL SIGNS:  Temperature 97, blood pressure  142/79, heart rate 78, respirations 18, saturating 98% on room air.  GENERAL:  She is an overweight and obese white female in no acute  distress. No family present at the time of my exam in the emergency  room.  HEENT:  PERRLA.  EOMI.  Mucous membranes are moist without scleral  icterus or conjunctivitis.  NECK:  Supple but thick. Full range of motion.  No appreciable  thyromegaly or lymphadenopathy.  No JVD, no carotid bruits.  CHEST:  Nontender to palpation along the sternum. Symmetrical movement  of respiration. Clear to auscultation  bilaterally without wheeze,  crackle, rhonchi or increased work of breathing.  CARDIOVASCULAR:  Exam normal S1-S2 with regular rate and rhythm.  Soft  2/6 systolic murmur.  ABDOMEN:  Obese but soft, nontender, nondistended.  Good bowel sounds.  No rebound or guarding.  EXTREMITIES:  Show no swelling or edema.  Her bilateral extremities with  good strong 2+ dorsalis pedis pulses bilaterally.  NEUROLOGIC:  She is awake, alert, oriented x3 without focal motor,  cognitive, or sensory deficits.  SKIN:  Shows no suspicious rashes or lesions.   LABORATORY AND X-RAYS:  Shows I-STAT H normal with a creatinine of 1.0.  Cardiac point-of-care enzymes negative x2 sets. D-dimer elevated at  1.28.  Coags normal.   Chest x-ray shows no acute disease.   EKG is normal sinus rhythm at 71 beats per minute without acute  findings, perhaps ST slight elevation laterally versus J-point  elevation. No previous EKG to which to compare.   CT chest angiogram in the emergency room shows no PE.  Aorta normal  without active pulmonary process.   ASSESSMENT/PLAN:  1. Chest pain.  This has unclear etiology and differential includes      possible cardiac versus musculoskeletal versus anxiety versus      gastrointestinal.  A CT angiogram is negative for PE. Will not      further pursue elevated D-dimer. Chest x-ray negative for acute      pulmonary process as well. Cardiac enzymes point of care in the ER      have been negative x2.  However, patient with a positive family      history as well as personal risk factor of hypertension and      obesity.  Will, therefore, admit to telemetry for observation to      rule out MI with serial cardiac enzymes.  Obtain 2-D echocardiogram      to evaluate for LV function as well as check TSH.  Will consider      cardiac consult inpatient if symptoms or if positive enzymes or      telemetry.  Continue nitroglycerin drip as begun in the ED given      complete relief of pain  with this intervention and reassess in the      morning with plans to discontinue nitroglycerin at that time if      negative workup.  Add proton pump inhibitor for possible GERD and  check a.m. fasting lipid profile.  If her enzymes negative and      telemetry negative with resolution of symptoms off nitroglycerin,      she may be appropriate for outpatient risk stratification by      cardiology following discharge home.  2. Hypertension.  Continue home lisinopril. Will monitor on      nitroglycerin drip.  3. Anxiety and depression, stable.  No active symptoms.  Will continue      home medications.  4. History of mitral valve prolapse.  5. Obesity with history of elevated triglycerides per patient report.      Suspect probable metabolic syndrome.  We will recheck fasting lipid      profile in morning.  A nutrition consult has been ordered.  Have      discussed with the patient need for diet changes, weight loss and      exercise, especially given mother with diabetes as well as paternal      history of coronary disease.  Please see orders for further      details.      Sherry A. Felicity Coyer, MD  Electronically Signed     VAL/MEDQ  D:  06/04/2007  T:  06/04/2007  Job:  [id number]   cc:   Sherry Levins, MD

## 2010-10-11 NOTE — Discharge Summary (Signed)
Sherry White, MATZEN           ACCOUNT NO.:  [id number]   MEDICAL RECORD NO.:  [id number]          PATIENT TYPE:  OBV   LOCATION:  4731                         FACILITY:  [facility]   PHYSICIAN:  Valerie A. Felicity Coyer, MDDATE OF BIRTH:  [birth year]   DATE OF ADMISSION:  06/04/2007  DATE OF DISCHARGE:  06/05/2007                               DISCHARGE SUMMARY   DISCHARGE DIAGNOSES:  1. Chest pain of unclear etiology.  2. Hypertension.  3. Hypertriglyceridemia.  4. Questionable metabolic syndrome.   HISTORY OF PRESENT ILLNESS:  The patient is a 50 year old, married,  Caucasian female without prior history of CAD.  She reports waking up on  the morning of January 5th at approximately 4 a.m. with 9 out of 10 left-  sided chest heaviness without associated symptoms, lasting approximately  10 minutes and resolving without any interventions.  The next day while  the patient was driving to work, she reported again 9 out of 10 left-  sided chest tightness, which was actually different from her symptoms on  January 5th.  There were again no associated symptoms at this time, but  symptoms did persist for several hours, which prompted patient to go to  the ED.  The patient was treated on arrival to ED with IV morphine and  nitroglycerin though her pain did not completely resolve until greater  than eight hours after onset.  The patient was admitted to the hospital  for further evaluation.   PAST MEDICAL HISTORY:  1. Hypertension.  2. Depression/anxiety.  3. Remote cholecystectomy.  4. Remote hernia repair.   PERTINENT FAMILY HISTORY:  The patient's father is in his 56s and had  his first MI in his 20s and then with a subsequent MI and then CABG.  Mother is alive at age 10 with history of hypertension and diabetes.  She also has a brother, who has an enlarged heart but is otherwise well.   COURSE OF HOSPITALIZATION BY PROBLEMS:  1. Chest pain:  Cardiac markers done in the emergency room  were      negative, and EKG was without any acute changes despite prolonged      chest pain.  D-dimer was found to be elevated in the emergency      room.  Patient underwent a CT chest angio, which was negative for      pulmonary embolism.  Patient underwent two-D echo and the results      are pending at the time of dictation.  The patient was evaluated by      Dr. Myrtis Ser of [facility] and was felt safe to be dc 'd home      with further outpatient followup.  She will receive an exercise      Myoview in the office on January 8th at 9 a.m.  2. Hypertension:  Blood pressure was slightly elevated in the ER.      Plan to increase her Lisinopril to 20 mg.  3. Hypertriglyceridemia:  Patient is willing to try diet and exercise      upon discharge from the hospital and requests medications not be  started at this time.   MEDICATIONS AT DISCHARGE:  1. Lisinopril 20 mg daily.  2. Wellbutrin-XL 300 mg p.o. daily.  3. Celexa 40 mg p.o. daily.  4. Xanax 0.25 mg t.i.d. p.r.n.  5. Aspirin 324 mg p.o. daily.  6. Protonix 40 mg daily.  Patient can substitute Nexium over-the-      counter if desires.   LABORATORY DATA AT DISCHARGE:  Hemoglobin 12.8, hematocrit 37.1, white  blood cell count 7, platelets 258, BUN 11, creatinine 0.88, glucose 119.  Cardiac markers negative x3.  D-dimer is 1.28.  TSH 2.043.  Total  cholesterol 192, triglycerides 343, HDL 32, LDL 91.   As mentioned above, patient is to be dc 'd home with close followup with  Cardiology.  She is aware of her upcoming appointment on the 8th of  January at 9 a.m.      Valerie A. Felicity Coyer, MD  Electronically Signed     VAL/MEDQ  D:  06/05/2007  T:  06/05/2007  Job:  [id number]

## 2010-10-11 NOTE — Consult Note (Signed)
NAMEDORTHIA, TOUT NO.:  [id number]   MEDICAL RECORD NO.:  [id number]          PATIENT TYPE:  OBV   LOCATION:  4731                         FACILITY:  [facility]   PHYSICIAN:  Luis Abed, MD, FACCDATE OF BIRTH:  12/04/1960   DATE OF CONSULTATION:  06/05/2007  DATE OF DISCHARGE:                                 CONSULTATION   PRIMARY CARE Skylyn Slezak:  Corwin Levins, M.D.   PRIMARY CARDIOLOGIST:  She is new to [facility], being seen by  Dr. Luis Abed.   PATIENT PROFILE:  Patient is a 50 year old Caucasian female without  prior history of CAD, who presented to the ED on January 6 with chest  pain.   PROBLEM LIST:  1. Chest pain.      a.     Negative cardiac markers and normal ECG.  2. Hypertension.  3. Reported history of mitral valve prolapse.  4. Hypertriglyceridemia.      a.     Total cholesterol 192, triglycerides 343, HDL 32, LDL 91.  5. Anxiety.  6. Depression.  7. Status post cholecystectomy.  8. History of inguinal hernia, status post repair at age 67.   HISTORY OF PRESENT ILLNESS:  Patient is a 50 year old, married,  Caucasian female without prior history of CAD.  She was in her usual  state of health until January 5, when she awoke at approximately 4 a.m.  with 9/10 left-sided chest heaviness, without associated symptoms,  lasting approximately ten minutes, and resolving spontaneously.  She had  no additional symptoms throughout that day, on January 5, and then while  driving to work on January 6, had 9/10 left-sided chest tightness,  which was actually different from her symptoms on January 5.  There were  again no associated symptoms and this time, symptoms persisted for  several hours, prompting her to present to the [facility] ED.  In the  ED, she was treated with morphine and IV nitroglycerin approximately two  hours after her arrival, though her pain did not completely resolve  until greater than eight hours after onset.  She  was admitted by [facility] and cardiac markers have been negative times three  while her ECG has shown no acute changes.  She has remained on a very,  very low dose of IV nitroglycerin and did have one recurrence of chest  tightness, similar to what she had yesterday, at about 3 this morning,  that resolved after 15 minutes, without intervention.  She has had no  associated symptoms with any of her episodes and her symptoms have been  non-reproducible with palpation, deep breathing or cough.   ALLERGIES:  No known drug allergies.   CURRENT MEDICATIONS:  1. Lisinopril 10 mg daily.  2. Wellbutrin XL 300 mg daily.  3. Celexa 40 mg daily.  4. Xanax 0.25 mg t.i.d. p.r.n.  5. Aspirin 324 mg daily.  6. Protonix 40 mg daily.   FAMILY HISTORY:  Mother is alive at age 59 with the history of  hypertension and diabetes.  Father is in his 89s and had his first MI in  his  40s and then a subsequent MI and then CABG.  She has a brother who  has an enlarged heart, but is otherwise well.  She has a sister who is  alive and well.   SOCIAL HISTORY:  She lives in [place] with her husband and her 54 and  78 year old daughters.  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  She denies any tobacco, alcohol or drug  use.  She is not routinely exercising.   REVIEW OF SYSTEMS:  Positive for chest pain as outlined in the HPI.  She  also reports chronically poor exercise tolerance.  She has a history of  depression and anxiety.  Otherwise, all systems are reviewed and  negative.   PHYSICAL EXAM:  Temperature 98.5, heart rate 65, respirations 20, blood  pressure 131/91, pulse ox 97% on room air.  She weighs 84.8 kilos.  Pleasant, white female, in no acute distress, awake, alert and oriented  times three.  NECK:  No bruits or JVD.  LUNGS:  Respirations regular and unlabored, clear to auscultation.  CARDIAC:  Regular S1, S2, no S3, S4 or murmurs.   ABDOMEN:  Round, soft, nontender, nondistended.  Bowel sounds present  times four.  EXTREMITIES:  Warm, dry, pink, no clubbing, cyanosis or edema.  Dorsalis  pedis and posterior tibial pulses 2+ and equal bilaterally.  HEENT:  Normal.  NEUROLOGIC:  Grossly intact and nonfocal.   She had a CT of her chest that was negative for PE and showed a normal  aorta.  There was no evidence of pneumonia.  There was a question of  minimal edema.   EKG shows sinus rhythm with a normal axis, no acute ST or T changes and  a heart rate of 71.   Hemoglobin 12.8, hematocrit 37.1, WBC 7, platelets 258.  Sodium 134,  potassium 3.7, chloride 104, CO2 24, BUN 11, creatinine 0.88, glucose  119, total bilirubin 0.4, alkaline phosphatase 62, AST 21, ALT 20, BUN  3.4.  Cardiac markers negative times three.  D-dimer is 1.28, TSH 2.043,  calcium 8.5.  Total cholesterol 192, triglycerides 343, HDL 32, LDL 91.   ASSESSMENT AND PLAN:  1. Chest pain:  She presents with typical and atypical features.      Cardiac markers are negative and ECG is without acute changes,      despite prolonged symptoms on January 6 of greater than 8 hours.      We will discontinue her IV nitroglycerin, as she is on a very low      dose to begin with, and follow up her 2D echocardiogram, which is      currently pending.  Provided her EF is normal, without regional      wall motion abnormalities, we have arranged for an exercise Myoview      in our office on January 8 at 9 a.m.  2. Hypertension:  Blood pressure is slightly elevated.  Agree with      internal medicine plan to increase her lisinopril to 20 mg at the      time of discharge.  3. Hypertriglyceridemia:  Consider fibrate therapy.  Patient is      currently trying diet and exercise, although she is not actually      exercising.  4. Questionable metabolic syndrome:  This will require outpatient      followup and weight-loss.  5. Anxiety and depression:  Per primary team.  6.  Questionable history of mitral valve  prolapse:  Echo is pending.      Nicolasa Ducking, ANP      Luis Abed, MD, [facility]  Electronically Signed    CB/MEDQ  D:  06/05/2007  T:  06/05/2007  Job:  [id number]

## 2010-10-25 ENCOUNTER — Other Ambulatory Visit: Payer: Self-pay

## 2010-10-25 MED ORDER — ALPRAZOLAM 0.25 MG PO TABS: 0.2500 mg | ORAL_TABLET | Freq: Three times a day (TID) | ORAL | Status: DC | PRN

## 2010-10-25 NOTE — Telephone Encounter (Signed)
Faxed hardcopy to pharmacy. 

## 2010-11-14 ENCOUNTER — Encounter: Payer: Self-pay | Admitting: Internal Medicine

## 2010-11-15 ENCOUNTER — Ambulatory Visit (INDEPENDENT_AMBULATORY_CARE_PROVIDER_SITE_OTHER): Payer: BC Managed Care – PPO | Admitting: Internal Medicine

## 2010-11-15 ENCOUNTER — Encounter: Payer: Self-pay | Admitting: Internal Medicine

## 2010-11-15 VITALS — BP 126/82 | HR 98 | Temp 98.2°F | Ht 64.0 in | Wt 210.2 lb

## 2010-11-15 DIAGNOSIS — G5600 Carpal tunnel syndrome, unspecified upper limb: Secondary | ICD-10-CM

## 2010-11-15 DIAGNOSIS — M7710 Lateral epicondylitis, unspecified elbow: Secondary | ICD-10-CM

## 2010-11-15 DIAGNOSIS — R7302 Impaired glucose tolerance (oral): Secondary | ICD-10-CM

## 2010-11-15 DIAGNOSIS — J45901 Unspecified asthma with (acute) exacerbation: Secondary | ICD-10-CM | POA: Insufficient documentation

## 2010-11-15 DIAGNOSIS — Z0000 Encounter for general adult medical examination without abnormal findings: Secondary | ICD-10-CM | POA: Insufficient documentation

## 2010-11-15 DIAGNOSIS — R7309 Other abnormal glucose: Secondary | ICD-10-CM

## 2010-11-15 DIAGNOSIS — M7712 Lateral epicondylitis, left elbow: Secondary | ICD-10-CM

## 2010-11-15 DIAGNOSIS — G5601 Carpal tunnel syndrome, right upper limb: Secondary | ICD-10-CM

## 2010-11-15 HISTORY — DX: Impaired glucose tolerance (oral): R73.02

## 2010-11-15 HISTORY — DX: Carpal tunnel syndrome, right upper limb: G56.01

## 2010-11-15 HISTORY — DX: Lateral epicondylitis, left elbow: M77.12

## 2010-11-15 MED ORDER — FLUTICASONE-SALMETEROL 250-50 MCG/DOSE IN AEPB: 1.0000 | INHALATION_SPRAY | Freq: Two times a day (BID) | RESPIRATORY_TRACT | Status: DC

## 2010-11-15 MED ORDER — MELOXICAM 15 MG PO TABS: 15.0000 mg | ORAL_TABLET | Freq: Every day | ORAL | Status: DC

## 2010-11-15 MED ORDER — METHYLPREDNISOLONE ACETATE 80 MG/ML IJ SUSP
120.0000 mg | Freq: Once | INTRAMUSCULAR | Status: AC
  Administered 2010-11-15: 120 mg via INTRAMUSCULAR

## 2010-11-15 MED ORDER — PREDNISONE 10 MG PO TABS: 10.0000 mg | ORAL_TABLET | Freq: Every day | ORAL | Status: AC

## 2010-11-15 NOTE — Patient Instructions (Addendum)
You had the steroid shot today Increase the advair to the "250" (sent to the pharmacy) Take all new medications as prescribed - the prednisone, as well as the meloxicam as needed for the left elbow pain You are given the right wrist splint - for night use only - and to please followup for any worsening symptoms (like pain or weakness) Continue all other medications as before Please return in 3 mo with Lab testing done 3-5 days before, or sooner if needed Have a good time in [place]

## 2010-11-15 NOTE — Assessment & Plan Note (Signed)
Mild, for nsaid prn,  to f/u any worsening symptoms or concerns 

## 2010-11-15 NOTE — Assessment & Plan Note (Signed)
Mild symtpoms, exam benign, ok for nsaid as per the left lateral epicondylitis, right wrist splint at night prn, and  to f/u any worsening symptoms or concerns

## 2010-11-15 NOTE — Assessment & Plan Note (Signed)
Mild to mod flare, no evidence infection/afeb, for depo shot today, predpack asd, and increaed advair asd,  to f/u any worsening symptoms or concerns

## 2010-11-15 NOTE — Progress Notes (Signed)
Subjective:    Patient ID: Sherry White, female    DOB: [birth year], 50 y.o.   MRN: [id number]  HPI Here with 2-3 wks increased nonprod cough, wheezing, breathlessness with talking, sob/doe overall mildly functionally limiting;   Pt denies fever, wt loss, night sweats, loss of appetite, or other constitutional symptoms  Pt denies chest pain, orthopnea, PND, increased LE swelling, palpitations, dizziness or syncope. Pt denies new neurological symptoms such as new headache   Pt denies polydipsia, polyuria.  Also c/o tender swollen area to the left lateral elbow for several wks, no prior hx and no distal LUE pain/weak/numb or neck pain.  No trauma.  Also with a mild paresthesia uncomfortableness to the distal RUE, is right handed without weakness, swelling or tender to touch or trauma.  No prior hx of this as well. Uses her hand and arms for work on a regular basis. Denies worsening depressive symptoms, suicidal ideation, or panic, though has ongoing anxiety, not increased recently.  Overall good compliance with treatment, and good medicine tolerability. Past Medical History  Diagnosis Date  . Hypertension   . Anxiety   . Depression   . Asthma   . Hypertriglyceridemia   . Glucose intolerance (impaired glucose tolerance)   . Disc disease, degenerative, lumbar or lumbosacral   . MVP (mitral valve prolapse)    Past Surgical History  Procedure Date  . Cholecystectomy   . Biopsy breast june 2010    [facility]    reports that she has never smoked. She does not have any smokeless tobacco history on file. She reports that she does not drink alcohol. Her drug history not on file. family history includes Alcohol abuse in an unspecified family member; Coronary artery disease in her father; Diabetes in her father and mother; Hypertension in her father and mother; Thyroid disease in her sister; and Tuberculosis in an unspecified family member. No Known Allergies Current  Outpatient Prescriptions on File Prior to Visit  Medication Sig Dispense Refill  . ALPRAZolam (XANAX) 0.25 MG tablet Take 1 tablet (0.25 mg total) by mouth 3 (three) times daily as needed for anxiety. - due for return office visit July 2012 for further refills  90 tablet  1  . aspirin 81 MG EC tablet Take 81 mg by mouth daily.        . fenofibrate 160 MG tablet Take 160 mg by mouth daily.        . Fluticasone-Salmeterol (ADVAIR DISKUS) 100-50 MCG/DOSE AEPB Inhale 1 puff into the lungs 2 (two) times daily.        Marland Kitchen lisinopril (PRINIVIL,ZESTRIL) 20 MG tablet Take 20 mg by mouth daily.        . sertraline (ZOLOFT) 100 MG tablet Take 200 mg by mouth daily.        . simvastatin (ZOCOR) 40 MG tablet Take 40 mg by mouth daily.         No current facility-administered medications on file prior to visit.   Review of Systems Review of Systems  Constitutional: Negative for diaphoresis and unexpected weight change.  HENT: Negative for drooling and tinnitus.   Eyes: Negative for photophobia and visual disturbance.  Respiratory: Negative for choking and stridor.    Musculoskeletal: Negative for gait problem.  Skin: Negative for color change and wound.  Neurological: Negative for tremors and numbness.  Psychiatric/Behavioral: Negative for decreased concentration. The patient is not hyperactive.       Objective:   Physical Exam BP 126/82  Pulse 98  Temp(Src) 98.2 F (36.8 C) (Oral)  Ht 5\' 4"  (1.626 m)  Wt 210 lb 4 oz (95.369 kg)  BMI 36.09 kg/m2  SpO2 97%  LMP 11/14/2010 Physical Exam  VS noted Constitutional: Pt appears well-developed and well-nourished.  HENT: Head: Normocephalic.  Right Ear: External ear normal.  Left Ear: External ear normal.  Eyes: Conjunctivae and EOM are normal. Pupils are equal, round, and reactive to light.  Neck: Normal range of motion. Neck supple.  Cardiovascular: Normal rate and regular rhythm.   Pulmonary/Chest: Effort normal and breath sounds decreased with  bilat mild wheezes.  Neurological: Pt is alert. No cranial nerve deficit. motor/sens/dtr intact throughout, grip normal Skin: Skin is warm. No erythema.  Psychiatric: Pt behavior is normal. Thought content normal.  Mild tender, swelling left lateral epicondylar area       Assessment & Plan:

## 2010-11-15 NOTE — Assessment & Plan Note (Signed)
asympt - to check a1c next visit,  to f/u any worsening symptoms or concerns

## 2011-01-09 ENCOUNTER — Other Ambulatory Visit: Payer: Self-pay | Admitting: Internal Medicine

## 2011-02-16 LAB — CARDIAC PANEL(CRET KIN+CKTOT+MB+TROPI)
CK, MB: 1.3
CK, MB: 1.3
Relative Index: INVALID
Relative Index: INVALID
Total CK: 87
Total CK: 95
Troponin I: 0.01
Troponin I: 0.02

## 2011-02-16 LAB — I-STAT 8, (EC8 V) (CONVERTED LAB)
Acid-base deficit: 2
BUN: 13
Bicarbonate: 24.1 — ABNORMAL HIGH
Chloride: 107
Glucose, Bld: 98
HCT: 41
Hemoglobin: 13.9
Operator id: 198171
Potassium: 4.3
Sodium: 137
TCO2: 25
pCO2, Ven: 45.6
pH, Ven: 7.331 — ABNORMAL HIGH

## 2011-02-16 LAB — LIPID PANEL
Cholesterol: 192
HDL: 32 — ABNORMAL LOW
LDL Cholesterol: 91
Total CHOL/HDL Ratio: 6
Triglycerides: 343 — ABNORMAL HIGH
VLDL: 69 — ABNORMAL HIGH

## 2011-02-16 LAB — URINALYSIS, ROUTINE W REFLEX MICROSCOPIC
Bilirubin Urine: NEGATIVE
Glucose, UA: NEGATIVE
Hgb urine dipstick: NEGATIVE
Ketones, ur: NEGATIVE
Nitrite: NEGATIVE
Protein, ur: NEGATIVE
Specific Gravity, Urine: >1.046 — ABNORMAL HIGH
Urobilinogen, UA: 0.2
pH: 5

## 2011-02-16 LAB — DIFFERENTIAL
Basophils Absolute: 0
Basophils Relative: 1
Eosinophils Absolute: 0.2
Eosinophils Relative: 3
Lymphocytes Relative: 28
Lymphs Abs: 2
Monocytes Absolute: 0.4
Monocytes Relative: 5
Neutro Abs: 4.4
Neutrophils Relative %: 63

## 2011-02-16 LAB — COMPREHENSIVE METABOLIC PANEL
ALT: 20
AST: 21
Albumin: 3.4 — ABNORMAL LOW
Alkaline Phosphatase: 62
BUN: 11
CO2: 24
Calcium: 8.5
Chloride: 104
Creatinine, Ser: 0.88
GFR calc Af Amer: >60
GFR calc non Af Amer: >60
Glucose, Bld: 119 — ABNORMAL HIGH
Potassium: 3.7
Sodium: 134 — ABNORMAL LOW
Total Bilirubin: 0.4
Total Protein: 6.3

## 2011-02-16 LAB — CBC
HCT: 37.1
Hemoglobin: 12.8
MCHC: 34.5
MCV: 84.4
Platelets: 258
RBC: 4.39
RDW: 14.3
WBC: 7

## 2011-02-16 LAB — B-NATRIURETIC PEPTIDE (CONVERTED LAB): Pro B Natriuretic peptide (BNP): <30

## 2011-02-16 LAB — POCT CARDIAC MARKERS
CKMB, poc: 1.2
CKMB, poc: <1 — ABNORMAL LOW
Myoglobin, poc: 88.7
Myoglobin, poc: 97.5
Operator id: 198171
Operator id: 198171
Troponin i, poc: <0.05
Troponin i, poc: <0.05

## 2011-02-16 LAB — CK TOTAL AND CKMB (NOT AT ARMC)
CK, MB: 1.7
Relative Index: INVALID
Total CK: 85

## 2011-02-16 LAB — POCT I-STAT CREATININE
Creatinine, Ser: 1
Operator id: 198171

## 2011-02-16 LAB — TROPONIN I: Troponin I: 0.02

## 2011-02-16 LAB — PROTIME-INR
INR: 0.9
Prothrombin Time: 12.7

## 2011-02-16 LAB — D-DIMER, QUANTITATIVE: D-Dimer, Quant: 1.28 — ABNORMAL HIGH

## 2011-02-16 LAB — TSH: TSH: 2.043

## 2011-05-29 ENCOUNTER — Other Ambulatory Visit: Payer: Self-pay

## 2011-05-29 ENCOUNTER — Other Ambulatory Visit: Payer: Self-pay | Admitting: Internal Medicine

## 2011-05-29 MED ORDER — ALPRAZOLAM 0.25 MG PO TABS: 0.2500 mg | ORAL_TABLET | Freq: Three times a day (TID) | ORAL | Status: DC | PRN

## 2011-05-29 NOTE — Telephone Encounter (Signed)
Done hardcopy to robin  

## 2011-05-29 NOTE — Telephone Encounter (Signed)
Faxed hardcopy to pharmacy. 

## 2011-06-03 ENCOUNTER — Encounter: Payer: Self-pay | Admitting: Internal Medicine

## 2011-06-03 ENCOUNTER — Ambulatory Visit (INDEPENDENT_AMBULATORY_CARE_PROVIDER_SITE_OTHER): Payer: BC Managed Care – PPO | Admitting: Internal Medicine

## 2011-06-03 VITALS — BP 152/100 | HR 72 | Temp 98.3°F | Wt 211.0 lb

## 2011-06-03 DIAGNOSIS — R1011 Right upper quadrant pain: Secondary | ICD-10-CM

## 2011-06-03 MED ORDER — KETOROLAC TROMETHAMINE 10 MG PO TABS: 10.0000 mg | ORAL_TABLET | Freq: Four times a day (QID) | ORAL | Status: AC | PRN

## 2011-06-03 NOTE — Progress Notes (Signed)
  Subjective:    Patient ID: Sherry White, female    DOB: 10-Dec-1960, 51 y.o.   MRN: [id number]  HPI 3 day history of severe pain at the right flank and RUQ quadrant. She is s/p GB surgery. NO N/D. No blood in the urine. The pain is a steady ache. No fever. No affected by eating but does seem worse at night - which is after dinner.   I have reviewed the patient's medical history in detail and updated the computerized patient record.    Review of Systems System review is negative for any constitutional, cardiac, pulmonary, GI or neuro symptoms or complaints other than as described in the HPI.     Objective:   Physical Exam Filed Vitals:   06/03/11 1159  BP: 152/100  Pulse: 72  Temp: 98.3 F (36.8 C)   Obese white woman in no acute distress Back- tender to percussion over the right flank Abdomen- BS+, no masses, tender to palpation RUQ, epigastrium w/o guarding or rebound.        Assessment & Plan:  Abdominal pain RUQ and right flank - consider CBD stone vs duodenitis vs nephrolithiasis.  Plan - ketorolac for pain           CT abdomen to evaluate for stone or CBD disease           otc priolosec for possible duodenitis.

## 2011-06-03 NOTE — Patient Instructions (Signed)
Pain in the right flank and right upper quadrant of the abdomen: concern for kidney stone vs common bile duct stone vs duodenitis.  Plan - for pain ketorolac 10mg  three times a day           CT abdomen/pelvis to rule out stone either in the bile duct or kidney/ureter            Hydrate           Prilosec 20 mg (otc) take two every AM

## 2011-06-05 ENCOUNTER — Other Ambulatory Visit: Payer: Self-pay | Admitting: Internal Medicine

## 2011-06-05 DIAGNOSIS — R1011 Right upper quadrant pain: Secondary | ICD-10-CM

## 2011-06-06 ENCOUNTER — Ambulatory Visit
Admission: RE | Admit: 2011-06-06 | Discharge: 2011-06-06 | Disposition: A | Discharge status: Home / Self Care | Payer: BC Managed Care – PPO | Source: Ambulatory Visit | Attending: Internal Medicine | Admitting: Internal Medicine

## 2011-06-06 ENCOUNTER — Telehealth: Payer: Self-pay | Admitting: Internal Medicine

## 2011-06-06 DIAGNOSIS — R1011 Right upper quadrant pain: Secondary | ICD-10-CM

## 2011-06-06 NOTE — Telephone Encounter (Signed)
Requests results of ultrasound.

## 2011-06-06 NOTE — Telephone Encounter (Signed)
Patient called requesting [place] Results

## 2011-06-06 NOTE — Telephone Encounter (Signed)
NO gall-bladder disease, fatty infiltration of the liver and enlargement is noted. (no letter will be sent since verbal given) For questions OV.

## 2011-06-07 ENCOUNTER — Other Ambulatory Visit: Payer: BC Managed Care – PPO

## 2011-06-07 NOTE — Telephone Encounter (Signed)
Spoke with pt and gave [place] results. Scheduled appt for 06/08/11.

## 2011-06-08 ENCOUNTER — Other Ambulatory Visit (INDEPENDENT_AMBULATORY_CARE_PROVIDER_SITE_OTHER): Payer: BC Managed Care – PPO

## 2011-06-08 ENCOUNTER — Ambulatory Visit (INDEPENDENT_AMBULATORY_CARE_PROVIDER_SITE_OTHER): Payer: BC Managed Care – PPO | Admitting: Internal Medicine

## 2011-06-08 ENCOUNTER — Other Ambulatory Visit: Payer: Self-pay | Admitting: Internal Medicine

## 2011-06-08 ENCOUNTER — Other Ambulatory Visit: Payer: Self-pay

## 2011-06-08 VITALS — BP 132/84 | HR 80 | Temp 98.5°F | Wt 213.0 lb

## 2011-06-08 DIAGNOSIS — E781 Pure hyperglyceridemia: Secondary | ICD-10-CM

## 2011-06-08 DIAGNOSIS — E041 Nontoxic single thyroid nodule: Secondary | ICD-10-CM

## 2011-06-08 DIAGNOSIS — Z79899 Other long term (current) drug therapy: Secondary | ICD-10-CM

## 2011-06-08 DIAGNOSIS — I10 Essential (primary) hypertension: Secondary | ICD-10-CM

## 2011-06-08 DIAGNOSIS — K7581 Nonalcoholic steatohepatitis (NASH): Secondary | ICD-10-CM

## 2011-06-08 DIAGNOSIS — R7309 Other abnormal glucose: Secondary | ICD-10-CM

## 2011-06-08 DIAGNOSIS — K7689 Other specified diseases of liver: Secondary | ICD-10-CM

## 2011-06-08 HISTORY — DX: Nonalcoholic steatohepatitis (NASH): K75.81

## 2011-06-08 LAB — URINALYSIS, ROUTINE W REFLEX MICROSCOPIC
Bilirubin Urine: NEGATIVE
Hgb urine dipstick: NEGATIVE
Ketones, ur: NEGATIVE
Leukocytes, UA: NEGATIVE
Nitrite: NEGATIVE
Specific Gravity, Urine: >=1.03 (ref 1.000–1.030)
Total Protein, Urine: NEGATIVE
Urine Glucose: NEGATIVE
Urobilinogen, UA: 0.2 (ref 0.0–1.0)
pH: 5.5 (ref 5.0–8.0)

## 2011-06-08 LAB — IBC PANEL
Iron: 64 ug/dL (ref 42–145)
Saturation Ratios: 15.4 % — ABNORMAL LOW (ref 20.0–50.0)
Transferrin: 297.7 mg/dL (ref 212.0–360.0)

## 2011-06-08 LAB — LIPID PANEL
Cholesterol: 269 mg/dL — ABNORMAL HIGH (ref 0–200)
HDL: 43.9 mg/dL (ref >39.00)
Total CHOL/HDL Ratio: 6
Triglycerides: 397 mg/dL — ABNORMAL HIGH (ref 0.0–149.0)
VLDL: 79.4 mg/dL — ABNORMAL HIGH (ref 0.0–40.0)

## 2011-06-08 LAB — BASIC METABOLIC PANEL
BUN: 16 mg/dL (ref 6–23)
CO2: 23 mEq/L (ref 19–32)
Calcium: 9.5 mg/dL (ref 8.4–10.5)
Chloride: 106 mEq/L (ref 96–112)
Creatinine, Ser: 0.7 mg/dL (ref 0.4–1.2)
GFR: 99.81 mL/min (ref >60.00)
Glucose, Bld: 99 mg/dL (ref 70–99)
Potassium: 4 mEq/L (ref 3.5–5.1)
Sodium: 138 mEq/L (ref 135–145)

## 2011-06-08 LAB — CBC WITH DIFFERENTIAL/PLATELET
Basophils Absolute: 0 10*3/uL (ref 0.0–0.1)
Basophils Relative: 0.5 % (ref 0.0–3.0)
Eosinophils Absolute: 0.2 10*3/uL (ref 0.0–0.7)
Eosinophils Relative: 2.2 % (ref 0.0–5.0)
HCT: 38.6 % (ref 36.0–46.0)
Hemoglobin: 13.3 g/dL (ref 12.0–15.0)
Lymphocytes Relative: 27 % (ref 12.0–46.0)
Lymphs Abs: 2.2 10*3/uL (ref 0.7–4.0)
MCHC: 34.3 g/dL (ref 30.0–36.0)
MCV: 82.2 fl (ref 78.0–100.0)
Monocytes Absolute: 0.4 10*3/uL (ref 0.1–1.0)
Monocytes Relative: 5.3 % (ref 3.0–12.0)
Neutro Abs: 5.2 10*3/uL (ref 1.4–7.7)
Neutrophils Relative %: 65 % (ref 43.0–77.0)
Platelets: 259 10*3/uL (ref 150.0–400.0)
RBC: 4.7 Mil/uL (ref 3.87–5.11)
RDW: 14.6 % (ref 11.5–14.6)
WBC: 8 10*3/uL (ref 4.5–10.5)

## 2011-06-08 LAB — TSH: TSH: 2.2 u[IU]/mL (ref 0.35–5.50)

## 2011-06-08 LAB — HEMOGLOBIN A1C: Hgb A1c MFr Bld: 6.6 % — ABNORMAL HIGH (ref 4.6–6.5)

## 2011-06-08 LAB — HEPATIC FUNCTION PANEL
ALT: 84 U/L — ABNORMAL HIGH (ref 0–35)
AST: 66 U/L — ABNORMAL HIGH (ref 0–37)
Albumin: 4.1 g/dL (ref 3.5–5.2)
Alkaline Phosphatase: 96 U/L (ref 39–117)
Bilirubin, Direct: 0.1 mg/dL (ref 0.0–0.3)
Total Bilirubin: 0.5 mg/dL (ref 0.3–1.2)
Total Protein: 7.5 g/dL (ref 6.0–8.3)

## 2011-06-08 LAB — LDL CHOLESTEROL, DIRECT: Direct LDL: 148.3 mg/dL

## 2011-06-08 NOTE — Assessment & Plan Note (Signed)
Patient educated about NASH, provided pt ed from Up[employer]  Plan - lab to r/o hepatitis, connective tissue disease, iron storage disease           Referred for info to [email or web address]           May need GI consult           NSAID for pain.

## 2011-06-08 NOTE — Progress Notes (Signed)
  Subjective:    Patient ID: Sherry White, female    DOB: [birth year], 51 y.o.   MRN: [id number]  HPI Sherry White presents to discuss U/S findings, test done for right UQ pain.  I have reviewed the patient's medical history in detail and updated the computerized patient record.    Review of Systems System review is negative for any constitutional, cardiac, pulmonary, GI or neuro symptoms or complaints other than as described in the HPI.     Objective:   Physical Exam Filed Vitals:   06/08/11 1209  BP: 132/84  Pulse: 80  Temp: 98.5 F (36.9 C)   WNWD white woman in no distress Pulm - normal respirations Cor - 2+ radial pulse , regular Neuro - A&O x 3      Assessment & Plan:

## 2011-06-09 LAB — HEPATITIS B CORE ANTIBODY, TOTAL: Hep B Core Total Ab: NEGATIVE

## 2011-06-09 LAB — HEPATITIS C ANTIBODY: HCV Ab: NEGATIVE

## 2011-06-09 LAB — HEPATITIS B SURFACE ANTIBODY,QUALITATIVE: Hep B S Ab: NEGATIVE

## 2011-06-09 LAB — ANA: Anti Nuclear Antibody(ANA): NEGATIVE

## 2011-06-09 LAB — HEPATITIS B SURFACE ANTIGEN: Hepatitis B Surface Ag: NEGATIVE

## 2011-06-12 ENCOUNTER — Encounter: Payer: Self-pay | Admitting: Internal Medicine

## 2011-07-13 ENCOUNTER — Encounter: Payer: Self-pay | Admitting: Internal Medicine

## 2011-07-13 ENCOUNTER — Ambulatory Visit (INDEPENDENT_AMBULATORY_CARE_PROVIDER_SITE_OTHER): Payer: BC Managed Care – PPO | Admitting: Internal Medicine

## 2011-07-13 VITALS — BP 120/80 | HR 87 | Temp 97.5°F | Ht 64.0 in | Wt 209.2 lb

## 2011-07-13 DIAGNOSIS — R7309 Other abnormal glucose: Secondary | ICD-10-CM

## 2011-07-13 DIAGNOSIS — R7302 Impaired glucose tolerance (oral): Secondary | ICD-10-CM

## 2011-07-13 DIAGNOSIS — E785 Hyperlipidemia, unspecified: Secondary | ICD-10-CM

## 2011-07-13 DIAGNOSIS — Z0000 Encounter for general adult medical examination without abnormal findings: Secondary | ICD-10-CM

## 2011-07-13 DIAGNOSIS — R161 Splenomegaly, not elsewhere classified: Secondary | ICD-10-CM

## 2011-07-13 HISTORY — DX: Hyperlipidemia, unspecified: E78.5

## 2011-07-13 NOTE — Assessment & Plan Note (Addendum)

## 2011-07-13 NOTE — Patient Instructions (Addendum)
You will be contacted regarding the referral for: hematology for the enlarged spleen Continue all other medications as before Please have the pharmacy call if you need referrals Please return in 1 year for your yearly visit, or sooner if needed, with Lab testing done 3-5 days before

## 2011-07-15 ENCOUNTER — Encounter: Payer: Self-pay | Admitting: Internal Medicine

## 2011-07-15 NOTE — Assessment & Plan Note (Signed)
With recent imagine, for heme consult

## 2011-07-15 NOTE — Assessment & Plan Note (Signed)
stable overall by hx and exam, most recent data reviewed with pt, and pt to continue medical treatment as before  Lab Results  Component Value Date   HGBA1C 6.6* 06/08/2011

## 2011-07-15 NOTE — Progress Notes (Signed)
Subjective:    Patient ID: Sherry White, female    DOB: [birth year], 51 y.o.   MRN: [id number]  HPI  Here for wellness and f/u;  Overall doing ok;  Pt denies CP, worsening SOB, DOE, wheezing, orthopnea, PND, worsening LE edema, palpitations, dizziness or syncope.  Pt denies neurological change such as new Headache, facial or extremity weakness.  Pt denies polydipsia, polyuria, or low sugar symptoms. Pt states overall good compliance with treatment and medications, good tolerability, and trying to follow lower cholesterol diet.  Pt denies worsening depressive symptoms, suicidal ideation or panic. No fever, wt loss, night sweats, loss of appetite, or other constitutional symptoms.  Pt states good ability with ADL's, low fall risk, home safety reviewed and adequate, no significant changes in hearing or vision, and occasionally active with exercise.  Did have recent imaging with enlarged spleen Past Medical History  Diagnosis Date  . Hypertension   . Anxiety   . Depression   . Asthma   . Hypertriglyceridemia   . Glucose intolerance (impaired glucose tolerance)   . Disc disease, degenerative, lumbar or lumbosacral   . MVP (mitral valve prolapse)   . ANXIETY 06/17/2007  . ASTHMA 06/17/2007  . CHEST PAIN 06/17/2007  . DEPRESSION 06/17/2007  . GOITER, MULTINODULAR 10/22/2008  . HYPERSOMNIA 12/06/2009  . HYPERTENSION 06/17/2007  . HYPERTRIGLYCERIDEMIA 06/17/2007  . Impaired glucose tolerance 11/15/2010  . Left lateral epicondylitis 11/15/2010  . NASH (nonalcoholic steatohepatitis) 06/08/2011  . Right carpal tunnel syndrome 11/15/2010  . THYROID NODULE, RIGHT 10/19/2008  . Hyperlipidemia 07/13/2011   Past Surgical History  Procedure Date  . Cholecystectomy   . Biopsy breast june 2010    [facility]    reports that she has never smoked. She does not have any smokeless tobacco history on file. She reports that she does not drink alcohol. Her drug history not on file. family  history includes Alcohol abuse in an unspecified family member; Coronary artery disease in her father; Diabetes in her father and mother; Hypertension in her father and mother; Thyroid disease in her sister; and Tuberculosis in an unspecified family member. No Known Allergies Current Outpatient Prescriptions on File Prior to Visit  Medication Sig Dispense Refill  . ALPRAZolam (XANAX) 0.25 MG tablet Take 1 tablet (0.25 mg total) by mouth 3 (three) times daily as needed for anxiety.  90 tablet  1  . aspirin 81 MG EC tablet Take 81 mg by mouth daily.        . fenofibrate 160 MG tablet TAKE 1 TABLET BY MOUTH ONCE A DAY  90 tablet  1  . Fluticasone-Salmeterol (ADVAIR DISKUS) 250-50 MCG/DOSE AEPB Inhale 1 puff into the lungs 2 (two) times daily.  1 each  11  . lisinopril (PRINIVIL,ZESTRIL) 20 MG tablet TAKE 1 TABLET BY MOUTH ONCE A DAY  90 tablet  1  . sertraline (ZOLOFT) 100 MG tablet TAKE 2 TABLETS BY MOUTH EVERY DAY  180 tablet  1  . simvastatin (ZOCOR) 40 MG tablet TAKE 1 TABLET BY MOUTH DAILY  90 tablet  1   Review of Systems Review of Systems  Constitutional: Negative for diaphoresis, activity change, appetite change and unexpected weight change.  HENT: Negative for hearing loss, ear pain, facial swelling, mouth sores and neck stiffness.   Eyes: Negative for pain, redness and visual disturbance.  Respiratory: Negative for shortness of breath and wheezing.   Cardiovascular: Negative for chest pain and palpitations.  Gastrointestinal: Negative for diarrhea, blood in  stool, abdominal distention and rectal pain.  Genitourinary: Negative for hematuria, flank pain and decreased urine volume.  Musculoskeletal: Negative for myalgias and joint swelling.  Skin: Negative for color change and wound.  Neurological: Negative for syncope and numbness.  Hematological: Negative for adenopathy.  Psychiatric/Behavioral: Negative for hallucinations, self-injury, decreased concentration and agitation.        Objective:   Physical Exam BP 120/80  Pulse 87  Temp(Src) 97.5 F (36.4 C) (Oral)  Ht 5\' 4"  (1.626 m)  Wt 209 lb 4 oz (94.915 kg)  BMI 35.92 kg/m2  SpO2 96% Physical Exam  VS noted Constitutional: Pt is oriented to person, place, and time. Appears well-developed and well-nourished.  HENT:  Head: Normocephalic and atraumatic.  Right Ear: External ear normal.  Left Ear: External ear normal.  Nose: Nose normal.  Mouth/Throat: Oropharynx is clear and moist.  Eyes: Conjunctivae and EOM are normal. Pupils are equal, round, and reactive to light.  Neck: Normal range of motion. Neck supple. No JVD present. No tracheal deviation present.  Cardiovascular: Normal rate, regular rhythm, normal heart sounds and intact distal pulses.   Pulmonary/Chest: Effort normal and breath sounds normal.  Abdominal: Soft. Bowel sounds are normal. There is no tenderness. unable to apprec enlarged spleen Musculoskeletal: Normal range of motion. Exhibits no edema.  Lymphadenopathy:  Has no cervical adenopathy.  Neurological: Pt is alert and oriented to person, place, and time. Pt has normal reflexes. No cranial nerve deficit.  Skin: Skin is warm and dry. No rash noted.  Psychiatric:  Has  normal mood and affect. Behavior is normal.     Assessment & Plan:

## 2011-07-15 NOTE — Assessment & Plan Note (Signed)
stable overall by hx and exam, most recent data reviewed with pt, and pt to continue medical treatment as before  Lab Results  Component Value Date   [facility]  Value: 80        Total Cholesterol/HDL:CHD Risk Coronary Heart Disease Risk Table                     Men   Women  1/2 Average Risk   3.4   3.3  Average Risk       5.0   4.4  2 X Average Risk   9.6   7.1  3 X Average Risk  23.4   11.0        Use the calculated Patient Ratio above and the CHD Risk Table to determine the patient's CHD Risk.        ATP III CLASSIFICATION (LDL):  <100     mg/dL   Optimal  914-782  mg/dL   Near or Above                    Optimal  130-159  mg/dL   Borderline  956-213  mg/dL   High  >086     mg/dL   Very High 10/03/8467

## 2011-07-21 ENCOUNTER — Telehealth: Payer: Self-pay

## 2011-07-21 ENCOUNTER — Telehealth: Payer: Self-pay | Admitting: *Deleted

## 2011-07-21 NOTE — Telephone Encounter (Signed)
Called Amy Clovis Riley RN informed of MD's response.

## 2011-07-21 NOTE — Telephone Encounter (Signed)
Laroy Apple RN at the [facility] called to request clarification on the patients recent referral there . She stated the patient is being referred for splenomegaly, but they do not see any malignancies or blood disorders. Please clarify if this is the correct referral or should she go to a GI? Amy's call back number is [phone].

## 2011-07-21 NOTE — Telephone Encounter (Signed)
At last visit, it seemed unlikely to be GI related, so that was why the referral to hematology

## 2011-07-21 NOTE — Telephone Encounter (Signed)
Rec'd referral from Dr Oliver Barre for splenomegaly. Reviewed and discussed with MD, no indication of malignancy or blood abnormalities. Diagnosis may be more appropriate for GI.  Called and left message for Robin with Dr Jonny Ruiz to please call back for further clarification before we arrange consultation.

## 2011-07-26 ENCOUNTER — Telehealth: Payer: Self-pay | Admitting: Internal Medicine

## 2011-07-26 NOTE — Telephone Encounter (Signed)
S/w the pt and she is aware of her new pt appst in march

## 2011-07-28 ENCOUNTER — Telehealth: Payer: Self-pay | Admitting: Internal Medicine

## 2011-07-28 NOTE — Telephone Encounter (Signed)
Del. Ref.Oliver Barre Dx. Splenomegaly

## 2011-08-06 ENCOUNTER — Other Ambulatory Visit: Payer: Self-pay | Admitting: Internal Medicine

## 2011-08-06 DIAGNOSIS — R161 Splenomegaly, not elsewhere classified: Secondary | ICD-10-CM

## 2011-08-07 ENCOUNTER — Other Ambulatory Visit (HOSPITAL_BASED_OUTPATIENT_CLINIC_OR_DEPARTMENT_OTHER): Payer: BC Managed Care – PPO | Admitting: Lab

## 2011-08-07 ENCOUNTER — Telehealth: Payer: Self-pay | Admitting: Internal Medicine

## 2011-08-07 ENCOUNTER — Ambulatory Visit (HOSPITAL_BASED_OUTPATIENT_CLINIC_OR_DEPARTMENT_OTHER): Payer: BC Managed Care – PPO | Admitting: Internal Medicine

## 2011-08-07 ENCOUNTER — Ambulatory Visit: Payer: BC Managed Care – PPO

## 2011-08-07 VITALS — BP 121/76 | HR 79 | Temp 97.9°F | Ht 65.0 in | Wt 203.5 lb

## 2011-08-07 DIAGNOSIS — R161 Splenomegaly, not elsewhere classified: Secondary | ICD-10-CM

## 2011-08-07 DIAGNOSIS — R160 Hepatomegaly, not elsewhere classified: Secondary | ICD-10-CM

## 2011-08-07 DIAGNOSIS — R109 Unspecified abdominal pain: Secondary | ICD-10-CM

## 2011-08-07 LAB — COMPREHENSIVE METABOLIC PANEL
ALT: 49 U/L — ABNORMAL HIGH (ref 0–35)
AST: 44 U/L — ABNORMAL HIGH (ref 0–37)
Albumin: 4.5 g/dL (ref 3.5–5.2)
Alkaline Phosphatase: 81 U/L (ref 39–117)
BUN: 15 mg/dL (ref 6–23)
CO2: 23 mEq/L (ref 19–32)
Calcium: 9.6 mg/dL (ref 8.4–10.5)
Chloride: 106 mEq/L (ref 96–112)
Creatinine, Ser: 0.73 mg/dL (ref 0.50–1.10)
Glucose, Bld: 109 mg/dL — ABNORMAL HIGH (ref 70–99)
Potassium: 5.1 mEq/L (ref 3.5–5.3)
Sodium: 136 mEq/L (ref 135–145)
Total Bilirubin: 0.3 mg/dL (ref 0.3–1.2)
Total Protein: 7.2 g/dL (ref 6.0–8.3)

## 2011-08-07 LAB — CBC WITH DIFFERENTIAL/PLATELET
BASO%: 0.5 % (ref 0.0–2.0)
Basophils Absolute: 0 10*3/uL (ref 0.0–0.1)
EOS%: 2.8 % (ref 0.0–7.0)
Eosinophils Absolute: 0.2 10*3/uL (ref 0.0–0.5)
HCT: 40 % (ref 34.8–46.6)
HGB: 13.4 g/dL (ref 11.6–15.9)
LYMPH%: 28.5 % (ref 14.0–49.7)
MCH: 27.4 pg (ref 25.1–34.0)
MCHC: 33.6 g/dL (ref 31.5–36.0)
MCV: 81.7 fL (ref 79.5–101.0)
MONO#: 0.4 10*3/uL (ref 0.1–0.9)
MONO%: 6.8 % (ref 0.0–14.0)
NEUT#: 3.9 10*3/uL (ref 1.5–6.5)
NEUT%: 61.4 % (ref 38.4–76.8)
Platelets: 284 10*3/uL (ref 145–400)
RBC: 4.9 10*6/uL (ref 3.70–5.45)
RDW: 15.5 % — ABNORMAL HIGH (ref 11.2–14.5)
WBC: 6.4 10*3/uL (ref 3.9–10.3)
lymph#: 1.8 10*3/uL (ref 0.9–3.3)

## 2011-08-07 LAB — LACTATE DEHYDROGENASE: LDH: 170 U/L (ref 94–250)

## 2011-08-07 NOTE — Progress Notes (Signed)
[facility] Telephone:([phone]   Fax:([phone]) [phone]  CONSULT NOTE  REASON FOR CONSULTATION:  51 years old white female with hepatosplenomegaly  HPI Sherry White is a 51 y.o. female was past medical history significant for hypertension, and anxiety, depression, dyslipidemia, goiter and nonalcoholic steatohepatitis. The patient was seen recently by her primary care physician Dr. Oliver Barre complaining of pain on the right upper quadrant of the abdomen. It was thought  initially to be related to her previous cholecystectomy. Ultrasound of the abdomen was performed on 06/06/2011 and it showed diffuse fatty infiltration of the liver with hepatosplenomegaly. The patient was referred to me today for evaluation of her current condition. She is feeling fine she denied having any specific complaints except for discomfort in the upper quadrants of her abdomen. She denied having any significant nausea or vomiting. She has early satiety and she lost around 15 pounds in the last few months. She denied having any significant bleeding issues no bruises or ecchymosis. No pelvic history recently except for [place] last summer. The patient has no recurrent infection. No significant family history of leukemia or lymphoma. No other blood disease in the family. She has no history of blood clots. She denied having any significant chest pain or shortness of breath today. She is married with 2 children. 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 No history of smoking, alcohol or drug abuse.  AVWU@  Past Medical History  Diagnosis Date  . Hypertension   . Anxiety   . Depression   . Asthma   . Hypertriglyceridemia   . Glucose intolerance (impaired glucose tolerance)   . Disc disease, degenerative, lumbar or lumbosacral   . MVP (mitral valve prolapse)   . ANXIETY 06/17/2007  . ASTHMA 06/17/2007  . CHEST PAIN 06/17/2007  . DEPRESSION 06/17/2007  . GOITER, MULTINODULAR 10/22/2008  .  HYPERSOMNIA 12/06/2009  . HYPERTENSION 06/17/2007  . HYPERTRIGLYCERIDEMIA 06/17/2007  . Impaired glucose tolerance 11/15/2010  . Left lateral epicondylitis 11/15/2010  . NASH (nonalcoholic steatohepatitis) 06/08/2011  . Right carpal tunnel syndrome 11/15/2010  . THYROID NODULE, RIGHT 10/19/2008  . Hyperlipidemia 07/13/2011    Past Surgical History  Procedure Date  . Cholecystectomy   . Biopsy breast june 2010    [facility]    Family History  Problem Relation Age of Onset  . Hypertension Father   . Hypertension Mother   . Diabetes Mother   . Diabetes Father   . Coronary artery disease Father   . Alcohol abuse      uncle  . Tuberculosis      aunt  . Thyroid disease Sister     unknown type    Social History History  Substance Use Topics  . Smoking status: Never Smoker   . Smokeless tobacco: Not on file  . Alcohol Use: No    No Known Allergies  Current Outpatient Prescriptions  Medication Sig Dispense Refill  . ALPRAZolam (XANAX) 0.25 MG tablet Take 1 tablet (0.25 mg total) by mouth 3 (three) times daily as needed for anxiety.  90 tablet  1  . aspirin 81 MG EC tablet Take 81 mg by mouth daily.        . fenofibrate 160 MG tablet TAKE 1 TABLET BY MOUTH ONCE A DAY  90 tablet  1  . Fluticasone-Salmeterol (ADVAIR DISKUS) 250-50 MCG/DOSE AEPB Inhale 1 puff into the lungs 2 (two) times daily.  1 each  11  . lisinopril (  PRINIVIL,ZESTRIL) 20 MG tablet TAKE 1 TABLET BY MOUTH ONCE A DAY  90 tablet  1  . sertraline (ZOLOFT) 100 MG tablet TAKE 2 TABLETS BY MOUTH EVERY DAY  180 tablet  1  . simvastatin (ZOCOR) 40 MG tablet TAKE 1 TABLET BY MOUTH DAILY  90 tablet  1    Review of Systems  A comprehensive review of systems was negative except for: Constitutional: positive for weight loss Gastrointestinal: positive for abdominal pain  Physical Exam  FAO:ZHYQM, healthy, no distress, well nourished and well developed SKIN: skin color, texture, turgor are  normal HEAD: Normocephalic, No masses, lesions, tenderness or abnormalities EYES: normal, PERRLA EARS: External ears normal OROPHARYNX:no exudate, no erythema and lips, buccal mucosa, and tongue normal  NECK: supple, no adenopathy LYMPH:  no palpable lymphadenopathy, no hepatosplenomegaly BREAST:not examined LUNGS: clear to auscultation  HEART: regular rate & rhythm, no murmurs and no gallops ABDOMEN:abdomen soft, normal bowel sounds and no masses or organomegaly BACK: Back symmetric, no curvature. EXTREMITIES:no joint deformities, effusion, or inflammation, no edema, no skin discoloration, no clubbing, no cyanosis  NEURO: alert & oriented x 3 with fluent speech, no focal motor/sensory deficits, gait normal  PERFORMANCE STATUS: ECOG 0  Studies/Results: No results found.   ASSESSMENT: This is a very pleasant 52 years old white female with recent finding of hepatosplenomegaly on the ultrasound of the abdomen. The etiology is unclear at this point as the patient has no evidence for recent infection, chronic disease like sarcoidosis or hematologic disorders.  PLAN: I have a lengthy discussion with the patient and her daughter today about her current condition. I recommended for her to have a CT of the abdomen and pelvis for further evaluation of her hepatosplenomegaly and also to rule out any lymphadenopathy. If no clear etiology for her hepatosplenomegaly, I will continues additional observation for now. I would see her back for followup visit in 2 weeks for evaluation and discussion of her scan results. I also gave the patient the option of splenectomy but understanding the risk she declined that option.   All questions were answered. The patient knows to call the clinic with any problems, questions or concerns. We can certainly see the patient much sooner if necessary.  Thank you so much for allowing me to participate in the care of Sherry White. I will continue to follow up the  patient with you and assist in her care.  I spent 25 minutes counseling the patient face to face. The total time spent in the appointment was 55 minutes.   Khyra Viscuso K. 08/07/2011, 11:04 AM

## 2011-08-07 NOTE — Telephone Encounter (Signed)
gv pt appt for march2013.  scheduled pt for ct scan @ [facility]

## 2011-08-15 ENCOUNTER — Ambulatory Visit (HOSPITAL_COMMUNITY)
Admission: RE | Admit: 2011-08-15 | Discharge: 2011-08-15 | Disposition: A | Discharge status: Home / Self Care | Payer: BC Managed Care – PPO | Source: Ambulatory Visit | Attending: Internal Medicine | Admitting: Internal Medicine

## 2011-08-15 DIAGNOSIS — K7689 Other specified diseases of liver: Secondary | ICD-10-CM | POA: Insufficient documentation

## 2011-08-15 DIAGNOSIS — R161 Splenomegaly, not elsewhere classified: Secondary | ICD-10-CM

## 2011-08-15 DIAGNOSIS — Z9089 Acquired absence of other organs: Secondary | ICD-10-CM | POA: Insufficient documentation

## 2011-08-15 DIAGNOSIS — R935 Abnormal findings on diagnostic imaging of other abdominal regions, including retroperitoneum: Secondary | ICD-10-CM | POA: Insufficient documentation

## 2011-08-15 MED ORDER — IOHEXOL 300 MG/ML  SOLN
100.0000 mL | Freq: Once | INTRAMUSCULAR | Status: AC | PRN
  Administered 2011-08-15: 100 mL via INTRAVENOUS

## 2011-08-16 ENCOUNTER — Ambulatory Visit (HOSPITAL_BASED_OUTPATIENT_CLINIC_OR_DEPARTMENT_OTHER): Payer: BC Managed Care – PPO | Admitting: Internal Medicine

## 2011-08-16 VITALS — BP 119/76 | HR 81 | Temp 97.1°F | Ht 65.0 in | Wt 202.8 lb

## 2011-08-16 DIAGNOSIS — Z0389 Encounter for observation for other suspected diseases and conditions ruled out: Secondary | ICD-10-CM

## 2011-08-16 DIAGNOSIS — R161 Splenomegaly, not elsewhere classified: Secondary | ICD-10-CM

## 2011-08-16 NOTE — Progress Notes (Signed)
[facility] [phone]   [phone]  OFFICE PROGRESS NOTE  Oliver Barre, MD, MD [address] [facility] [address] [place] [address] [place] [address]  DIAGNOSIS: Questionable hepatosplenomegaly  PRIOR THERAPY: None  CURRENT THERAPY: None  INTERVAL HISTORY: Sherry White 51 y.o. female returns to the clinic today for followup visit. The patient was seen for initial evaluation on 08/07/2011 with suspicious hepatosplenomegaly on ultrasound of the abdomen. There was no clear etiology at that time for her hepatosplenomegaly. I ordered a CT scan of the abdomen and pelvis for evaluation of her liver and spleen size as well as to without any lymphadenopathy. The scan was done on 08/15/2011 and she is here for evaluation and discussion of her results. She is feeling fine and he denied having any specific complaints except for occasional pain in the right upper quadrant of the abdomen most likely secondary to her previous cholecystectomy. The patient denied having any significant weight loss or night sweats. No palpable lymphadenopathy.  MEDICAL HISTORY: Past Medical History  Diagnosis Date  . Hypertension   . Anxiety   . Depression   . Asthma   . Hypertriglyceridemia   . Glucose intolerance (impaired glucose tolerance)   . Disc disease, degenerative, lumbar or lumbosacral   . MVP (mitral valve prolapse)   . ANXIETY 06/17/2007  . ASTHMA 06/17/2007  . CHEST PAIN 06/17/2007  . DEPRESSION 06/17/2007  . GOITER, MULTINODULAR 10/22/2008  . HYPERSOMNIA 12/06/2009  . HYPERTENSION 06/17/2007  . HYPERTRIGLYCERIDEMIA 06/17/2007  . Impaired glucose tolerance 11/15/2010  . Left lateral epicondylitis 11/15/2010  . NASH (nonalcoholic steatohepatitis) 06/08/2011  . Right carpal tunnel syndrome 11/15/2010  . THYROID NODULE, RIGHT 10/19/2008  . Hyperlipidemia 07/13/2011    ALLERGIES:   has no known allergies.  MEDICATIONS:  Current Outpatient Prescriptions  Medication  Sig Dispense Refill  . ALPRAZolam (XANAX) 0.25 MG tablet Take 1 tablet (0.25 mg total) by mouth 3 (three) times daily as needed for anxiety.  90 tablet  1  . aspirin 81 MG EC tablet Take 81 mg by mouth daily.        . fenofibrate 160 MG tablet TAKE 1 TABLET BY MOUTH ONCE A DAY  90 tablet  1  . Fluticasone-Salmeterol (ADVAIR DISKUS) 250-50 MCG/DOSE AEPB Inhale 1 puff into the lungs 2 (two) times daily.  1 each  11  . lisinopril (PRINIVIL,ZESTRIL) 20 MG tablet TAKE 1 TABLET BY MOUTH ONCE A DAY  90 tablet  1  . sertraline (ZOLOFT) 100 MG tablet TAKE 2 TABLETS BY MOUTH EVERY DAY  180 tablet  1  . simvastatin (ZOCOR) 40 MG tablet TAKE 1 TABLET BY MOUTH DAILY  90 tablet  1    SURGICAL HISTORY:  Past Surgical History  Procedure Date  . Cholecystectomy   . Biopsy breast june 2010    [facility]    REVIEW OF SYSTEMS:  A comprehensive review of systems was negative.   PHYSICAL EXAMINATION: General appearance: alert, cooperative and no distress Neck: no adenopathy Lymph nodes: Cervical, supraclavicular, and axillary nodes normal. Resp: clear to auscultation bilaterally Cardio: regular rate and rhythm, S1, S2 normal, no murmur, click, rub or gallop GI: soft, non-tender; bowel sounds normal; no masses,  no organomegaly Extremities: extremities normal, atraumatic, no cyanosis or edema Neurologic: Alert and oriented X 3, normal strength and tone. Normal symmetric reflexes. Normal coordination and gait  ECOG PERFORMANCE STATUS: 0 - Asymptomatic  Blood pressure  119/76, pulse 81, temperature 97.1 F (36.2 C), temperature source Oral, height 5\' 5"  (1.651 m), weight 202 lb 12.8 oz (91.989 kg).  LABORATORY DATA: Lab Results  Component Value Date   WBC 6.4 08/07/2011   HGB 13.4 08/07/2011   HCT 40.0 08/07/2011   MCV 81.7 08/07/2011   PLT 284 08/07/2011      Chemistry      Component Value Date/Time   NA 136 08/07/2011 0950   K 5.1 08/07/2011 0950   CL 106 08/07/2011 0950    CO2 23 08/07/2011 0950   BUN 15 08/07/2011 0950   CREATININE 0.73 08/07/2011 0950      Component Value Date/Time   CALCIUM 9.6 08/07/2011 0950   ALKPHOS 81 08/07/2011 0950   AST 44* 08/07/2011 0950   ALT 49* 08/07/2011 0950   BILITOT 0.3 08/07/2011 0950       RADIOGRAPHIC STUDIES: Ct Abdomen Pelvis W Contrast  08/15/2011  *RADIOLOGY REPORT*  Clinical Data: Question hepatosplenomegaly on ultrasound  CT ABDOMEN AND PELVIS WITH CONTRAST  Technique:  Multidetector CT imaging of the abdomen and pelvis was performed following the standard protocol during bolus administration of intravenous contrast.  Contrast: OMNIPAQUE IOHEXOL 300 MG/ML IJ SOLN  Comparison: Ultrasound 06/06/2011  Findings: Lung bases are clear.  No pericardial fluid.  There is diffuse fatty infiltration the liver. The liver measures 21 cm in craniocaudad dimension as prior.  Gallbladder is absent. Common bile duct is prominent related to prior cholecystectomy. The pancreas is normal.  The spleen appears normal in volume measuring 11 cm in craniocaudad dimension with a calculated volume of 398 ml.  The adrenal glands and kidneys are normal.  The stomach, small bowel, appendix, and cecum are normal.  The colon rectosigmoid colon are normal.  Abdominal aorta normal caliber.  No retroperitoneal periportal lymphadenopathy.  There is a separate origin of the common hepatic artery to the aorta.  No free fluid the pelvis.  The bladder and uterus and ovaries are normal.  No pelvic lymphadenopathy. Review of  bone windows demonstrates no aggressive osseous lesions.  IMPRESSION:  1. Splenic volume and liver volume are within normal limits.  2.  Diffuse hepatic steatosis.  3.  Cholecystectomy  Original Report Authenticated By: Genevive Bi, M.D.    ASSESSMENT: This is a very pleasant 51 years old white female with questionable hepatosplenomegaly on ultrasound of the abdomen. CT scan of the abdomen which is more specific showed that her spleen and  liver are within the normal limits and no abdominal or pelvic lymphadenopathy.   PLAN: I discussed the scan results with the patient and recommended for her continuous observation with her primary care physician. I don't see a need for the patient to continue her routine followup visit at the cancer Center at this point but I would be happy to see her in the future as needed. The patient agreed to the current plan.  All questions were answered. The patient knows to call the clinic with any problems, questions or concerns. We can certainly see the patient much sooner if necessary.

## 2011-10-13 ENCOUNTER — Other Ambulatory Visit: Payer: Self-pay | Admitting: Internal Medicine

## 2012-04-13 ENCOUNTER — Other Ambulatory Visit: Payer: Self-pay | Admitting: Internal Medicine

## 2012-04-15 ENCOUNTER — Other Ambulatory Visit: Payer: Self-pay

## 2012-04-15 MED ORDER — ALPRAZOLAM 0.25 MG PO TABS: 0.2500 mg | ORAL_TABLET | Freq: Three times a day (TID) | ORAL | Status: DC | PRN

## 2012-04-15 NOTE — Telephone Encounter (Signed)
Faxed hardcopy to pharmacy. 

## 2012-04-15 NOTE — Telephone Encounter (Signed)
Done hardcopy to robin  

## 2012-12-10 ENCOUNTER — Telehealth: Payer: Self-pay

## 2012-12-10 DIAGNOSIS — Z0000 Encounter for general adult medical examination without abnormal findings: Secondary | ICD-10-CM

## 2012-12-10 NOTE — Telephone Encounter (Signed)
cpx labs entered  

## 2012-12-11 ENCOUNTER — Other Ambulatory Visit: Payer: Self-pay | Admitting: Internal Medicine

## 2012-12-11 MED ORDER — ALPRAZOLAM 0.25 MG PO TABS: 0.2500 mg | ORAL_TABLET | Freq: Three times a day (TID) | ORAL | Status: DC | PRN

## 2012-12-11 NOTE — Telephone Encounter (Signed)
One month each ok

## 2012-12-11 NOTE — Telephone Encounter (Signed)
Faxed hardcopy to [facility] 

## 2012-12-11 NOTE — Telephone Encounter (Signed)
Refill request patient has not been seen since 2/13, but has appt. Scheduled in August 2014

## 2013-01-03 ENCOUNTER — Encounter: Payer: Self-pay | Admitting: Internal Medicine

## 2013-01-03 ENCOUNTER — Ambulatory Visit: Payer: BC Managed Care – PPO

## 2013-01-03 ENCOUNTER — Other Ambulatory Visit (INDEPENDENT_AMBULATORY_CARE_PROVIDER_SITE_OTHER): Payer: BC Managed Care – PPO

## 2013-01-03 ENCOUNTER — Ambulatory Visit (INDEPENDENT_AMBULATORY_CARE_PROVIDER_SITE_OTHER): Payer: BC Managed Care – PPO | Admitting: Internal Medicine

## 2013-01-03 ENCOUNTER — Telehealth: Payer: Self-pay

## 2013-01-03 VITALS — BP 152/96 | HR 95 | Temp 98.1°F | Wt 206.0 lb

## 2013-01-03 DIAGNOSIS — IMO0001 Reserved for inherently not codable concepts without codable children: Secondary | ICD-10-CM

## 2013-01-03 DIAGNOSIS — E1165 Type 2 diabetes mellitus with hyperglycemia: Secondary | ICD-10-CM | POA: Insufficient documentation

## 2013-01-03 DIAGNOSIS — R7989 Other specified abnormal findings of blood chemistry: Secondary | ICD-10-CM

## 2013-01-03 DIAGNOSIS — R739 Hyperglycemia, unspecified: Secondary | ICD-10-CM

## 2013-01-03 DIAGNOSIS — I10 Essential (primary) hypertension: Secondary | ICD-10-CM

## 2013-01-03 DIAGNOSIS — E781 Pure hyperglyceridemia: Secondary | ICD-10-CM

## 2013-01-03 DIAGNOSIS — Z0000 Encounter for general adult medical examination without abnormal findings: Secondary | ICD-10-CM

## 2013-01-03 DIAGNOSIS — R7309 Other abnormal glucose: Secondary | ICD-10-CM

## 2013-01-03 LAB — POCT URINALYSIS DIPSTICK
Bilirubin, UA: NEGATIVE
Blood, UA: NEGATIVE
Glucose, UA: 2000
Ketones, UA: 5
Leukocytes, UA: NEGATIVE
Nitrite, UA: NEGATIVE
Protein, UA: NEGATIVE
Spec Grav, UA: <=1.005
Urobilinogen, UA: 0.2
pH, UA: 5

## 2013-01-03 LAB — LIPID PANEL
Cholesterol: 236 mg/dL — ABNORMAL HIGH (ref 0–200)
HDL: 32.3 mg/dL — ABNORMAL LOW (ref >39.00)
Total CHOL/HDL Ratio: 7
Triglycerides: 917 mg/dL — ABNORMAL HIGH (ref 0.0–149.0)
VLDL: 183.4 mg/dL — ABNORMAL HIGH (ref 0.0–40.0)

## 2013-01-03 LAB — HEPATIC FUNCTION PANEL
ALT: 130 U/L — ABNORMAL HIGH (ref 0–35)
AST: 93 U/L — ABNORMAL HIGH (ref 0–37)
Albumin: 3.9 g/dL (ref 3.5–5.2)
Alkaline Phosphatase: 145 U/L — ABNORMAL HIGH (ref 39–117)
Bilirubin, Direct: 0.1 mg/dL (ref 0.0–0.3)
Total Bilirubin: 0.5 mg/dL (ref 0.3–1.2)
Total Protein: 7.3 g/dL (ref 6.0–8.3)

## 2013-01-03 LAB — CBC WITH DIFFERENTIAL/PLATELET
Basophils Absolute: 0 10*3/uL (ref 0.0–0.1)
Basophils Relative: 0.5 % (ref 0.0–3.0)
Eosinophils Absolute: 0.2 10*3/uL (ref 0.0–0.7)
Eosinophils Relative: 3.1 % (ref 0.0–5.0)
HCT: 41 % (ref 36.0–46.0)
Hemoglobin: 14.1 g/dL (ref 12.0–15.0)
Lymphocytes Relative: 31.9 % (ref 12.0–46.0)
Lymphs Abs: 2 10*3/uL (ref 0.7–4.0)
MCHC: 34.3 g/dL (ref 30.0–36.0)
MCV: 84 fl (ref 78.0–100.0)
Monocytes Absolute: 0.4 10*3/uL (ref 0.1–1.0)
Monocytes Relative: 5.8 % (ref 3.0–12.0)
Neutro Abs: 3.6 10*3/uL (ref 1.4–7.7)
Neutrophils Relative %: 58.7 % (ref 43.0–77.0)
Platelets: 197 10*3/uL (ref 150.0–400.0)
RBC: 4.89 Mil/uL (ref 3.87–5.11)
RDW: 14.5 % (ref 11.5–14.6)
WBC: 6.2 10*3/uL (ref 4.5–10.5)

## 2013-01-03 LAB — BASIC METABOLIC PANEL
BUN: 13 mg/dL (ref 6–23)
CO2: 25 mEq/L (ref 19–32)
Calcium: 9.8 mg/dL (ref 8.4–10.5)
Chloride: 96 mEq/L (ref 96–112)
Creatinine, Ser: 0.8 mg/dL (ref 0.4–1.2)
GFR: 76.39 mL/min (ref >60.00)
Glucose, Bld: 593 mg/dL (ref 70–99)
Potassium: 4.5 mEq/L (ref 3.5–5.1)
Sodium: 130 mEq/L — ABNORMAL LOW (ref 135–145)

## 2013-01-03 LAB — URINALYSIS, ROUTINE W REFLEX MICROSCOPIC
Bilirubin Urine: NEGATIVE
Ketones, ur: NEGATIVE
Leukocytes, UA: NEGATIVE
Nitrite: NEGATIVE
Specific Gravity, Urine: 1.01 (ref 1.000–1.030)
Total Protein, Urine: NEGATIVE
Urine Glucose: >=1000
Urobilinogen, UA: 0.2 (ref 0.0–1.0)
pH: 6 (ref 5.0–8.0)

## 2013-01-03 LAB — LDL CHOLESTEROL, DIRECT: Direct LDL: 75.3 mg/dL

## 2013-01-03 LAB — TSH: TSH: 2.2 u[IU]/mL (ref 0.35–5.50)

## 2013-01-03 LAB — HEMOGLOBIN A1C: Hgb A1c MFr Bld: 11.4 % — ABNORMAL HIGH (ref 4.6–6.5)

## 2013-01-03 MED ORDER — INSULIN DETEMIR 100 UNIT/ML FLEXPEN: 20.0000 [IU] | PEN_INJECTOR | Freq: Every day | SUBCUTANEOUS | Status: DC

## 2013-01-03 MED ORDER — NOVA SAFETY LANCETS 23G MISC: 23.0000 g | Freq: Three times a day (TID) | Status: DC

## 2013-01-03 MED ORDER — GLUCOSE BLOOD VI STRP: 1.0000 | ORAL_STRIP | Freq: Three times a day (TID) | Status: DC

## 2013-01-03 MED ORDER — EZETIMIBE-SIMVASTATIN 10-40 MG PO TABS: 1.0000 | ORAL_TABLET | Freq: Every day | ORAL | Status: DC

## 2013-01-03 MED ORDER — LISINOPRIL 40 MG PO TABS: 40.0000 mg | ORAL_TABLET | Freq: Every day | ORAL | Status: DC

## 2013-01-03 MED ORDER — INSULIN PEN NEEDLE 32G X 4 MM MISC: Status: DC

## 2013-01-03 NOTE — Progress Notes (Signed)
Subjective:    Patient ID: Sherry White, female    DOB: [birth year], 52 y.o.   MRN: [id number]  HPI  Pt presents to the clinic today for f/u of critical lab values. Labs drawn this am revealed sodium of 130, glucose of 593, and triglycerides of 917. She has c/o blurred vision, headaches, polydypsia and polyuria. She does have a family history of diabetes. Her blood pressure has been consistently elevated over the past month 150's/90's despite being on the lisinopril. She denies chest pain, chest tightness or shortness of breath.  Review of Systems      Past Medical History  Diagnosis Date  . Hypertension   . Anxiety   . Depression   . Asthma   . Hypertriglyceridemia   . Glucose intolerance (impaired glucose tolerance)   . Disc disease, degenerative, lumbar or lumbosacral   . MVP (mitral valve prolapse)   . ANXIETY 06/17/2007  . ASTHMA 06/17/2007  . CHEST PAIN 06/17/2007  . DEPRESSION 06/17/2007  . GOITER, MULTINODULAR 10/22/2008  . HYPERSOMNIA 12/06/2009  . HYPERTENSION 06/17/2007  . HYPERTRIGLYCERIDEMIA 06/17/2007  . Impaired glucose tolerance 11/15/2010  . Left lateral epicondylitis 11/15/2010  . NASH (nonalcoholic steatohepatitis) 06/08/2011  . Right carpal tunnel syndrome 11/15/2010  . THYROID NODULE, RIGHT 10/19/2008  . Hyperlipidemia 07/13/2011    Current Outpatient Prescriptions  Medication Sig Dispense Refill  . ALPRAZolam (XANAX) 0.25 MG tablet Take 1 tablet (0.25 mg total) by mouth 3 (three) times daily as needed for anxiety.  90 tablet  0  . aspirin 81 MG EC tablet Take 81 mg by mouth daily.        . fenofibrate 160 MG tablet TAKE 1 TABLET BY MOUTH ONCE A DAY  90 tablet  0  . Fluticasone-Salmeterol (ADVAIR DISKUS) 250-50 MCG/DOSE AEPB Inhale 1 puff into the lungs 2 (two) times daily.  1 each  11  . lisinopril (PRINIVIL,ZESTRIL) 20 MG tablet TAKE 1 TABLET BY MOUTH ONCE A DAY  30 tablet  0  . sertraline (ZOLOFT) 100 MG tablet TAKE 2 TABLETS BY MOUTH EVERY DAY  60 tablet   0  . simvastatin (ZOCOR) 40 MG tablet TAKE 1 TABLET BY MOUTH DAILY  90 tablet  0   No current facility-administered medications for this visit.    No Known Allergies  Family History  Problem Relation Age of Onset  . Hypertension Father   . Hypertension Mother   . Diabetes Mother   . Diabetes Father   . Coronary artery disease Father   . Alcohol abuse      uncle  . Tuberculosis      aunt  . Thyroid disease Sister     unknown type    History   Social History  . Marital Status: Married    Spouse Name: N/A    Number of Children: 2  . Years of Education: N/A   Occupational History  . elem school teacher    Social History Main Topics  . Smoking status: Never Smoker   . Smokeless tobacco: Not on file  . Alcohol Use: No  . Drug Use: Not on file  . Sexually Active: Not on file   Other Topics Concern  . Not on file   Social History Narrative  . No narrative on file     Constitutional: Pt reports fatigue, headache. Denies fever, malaise, or abrupt weight changes.  HEENT: Pt reports blurred vision. Denies eye pain, eye redness, ear pain, ringing in the ears, wax buildup, runny  nose, nasal congestion, bloody nose, or sore throat. Respiratory: Denies difficulty breathing, shortness of breath, cough or sputum production.   Cardiovascular: Denies chest pain, chest tightness, palpitations or swelling in the hands or feet.  GU: Pt reports frequency. Denies urgency, pain with urination, burning sensation, blood in urine, odor or discharge. Skin: Denies redness, rashes, lesions or ulcercations.  Neurological: Pt reports numbness or tingling in hands or feet.Denies dizziness, difficulty with memory, difficulty with speech or problems with balance and coordination.   No other specific complaints in a complete review of systems (except as listed in HPI above).  Objective:   Physical Exam   BP 152/96  Pulse 95  Temp(Src) 98.1 F (36.7 C) (Oral)  Wt 206 lb (93.441 kg)  BMI  34.28 kg/m2  SpO2 97% Wt Readings from Last 3 Encounters:  01/03/13 206 lb (93.441 kg)  08/16/11 202 lb 12.8 oz (91.989 kg)  08/07/11 203 lb 8 oz (92.307 kg)    General: Appears her stated age, well developed, well nourished in NAD. Skin: Warm, dry and intact. No rashes, lesions or ulcerations noted. HEENT: Head: normal shape and size; Eyes: sclera white, no icterus, conjunctiva pink, PERRLA and EOMs intact; Ears: Tm's gray and intact, normal light reflex; Nose: mucosa pink and moist, septum midline; Throat/Mouth: Teeth present, mucosa pink and moist, no exudate, lesions or ulcerations noted.  Cardiovascular: Normal rate and rhythm. S1,S2 noted.  No murmur, rubs or gallops noted. No JVD or BLE edema. No carotid bruits noted. Pulmonary/Chest: Normal effort and positive vesicular breath sounds. No respiratory distress. No wheezes, rales or ronchi noted.  Neurological: Alert and oriented. Cranial nerves II-XII intact. Coordination normal. +DTRs bilaterally.   BMET    Component Value Date/Time   NA 130* 01/03/2013 0846   K 4.5 01/03/2013 0846   CL 96 01/03/2013 0846   CO2 25 01/03/2013 0846   GLUCOSE 593* 01/03/2013 0846   BUN 13 01/03/2013 0846   CREATININE 0.8 01/03/2013 0846   CALCIUM 9.8 01/03/2013 0846   GFRNONAA 75.31 12/06/2009 0833   GFRAA  Value: >60        The eGFR has been calculated using the MDRD equation. This calculation has not been validated in all clinical situations. eGFR's persistently <60 mL/min signify possible Chronic Kidney Disease. 06/04/2007 1625    Lipid Panel     Component Value Date/Time   CHOL 236* 01/03/2013 0846   TRIG 917.0 Triglyceride is over 400; calculations on Lipids are invalid.* 01/03/2013 0846   HDL 32.30* 01/03/2013 0846   CHOLHDL 7 01/03/2013 0846   VLDL 183.4* 01/03/2013 0846   LDLCALC  Value: 91        Total Cholesterol/HDL:CHD Risk Coronary Heart Disease Risk Table                     Men   Women  1/2 Average Risk   3.4   3.3  Average Risk       5.0   4.4  2 X  Average Risk   9.6   7.1  3 X Average Risk  23.4   11.0        Use the calculated Patient Ratio above and the CHD Risk Table to determine the patient's CHD Risk.        ATP III CLASSIFICATION (LDL):  <100     mg/dL   Optimal  161-096  mg/dL   Near or Above  Optimal  130-159  mg/dL   Borderline  562-130  mg/dL   High  >865     mg/dL   Very High 12/04/4694 2952    CBC    Component Value Date/Time   WBC 6.2 01/03/2013 0846   WBC 6.4 08/07/2011 0950   RBC 4.89 01/03/2013 0846   RBC 4.90 08/07/2011 0950   HGB 14.1 01/03/2013 0846   HGB 13.4 08/07/2011 0950   HCT 41.0 01/03/2013 0846   HCT 40.0 08/07/2011 0950   PLT 197.0 01/03/2013 0846   PLT 284 08/07/2011 0950   MCV 84.0 01/03/2013 0846   MCV 81.7 08/07/2011 0950   MCH 27.4 08/07/2011 0950   MCHC 34.3 01/03/2013 0846   MCHC 33.6 08/07/2011 0950   RDW 14.5 01/03/2013 0846   RDW 15.5* 08/07/2011 0950   LYMPHSABS 2.0 01/03/2013 0846   LYMPHSABS 1.8 08/07/2011 0950   MONOABS 0.4 01/03/2013 0846   MONOABS 0.4 08/07/2011 0950   EOSABS 0.2 01/03/2013 0846   EOSABS 0.2 08/07/2011 0950   BASOSABS 0.0 01/03/2013 0846   BASOSABS 0.0 08/07/2011 0950    Hgb A1C Lab Results  Component Value Date   HGBA1C 6.6* 06/08/2011        Assessment & Plan:

## 2013-01-03 NOTE — Assessment & Plan Note (Signed)
Labs reviewed with pt Instructed pt to check sugars 3 x day at varying times Call me on Monday with your sugar log Will start Levemir 20 units daily (to get rapid sugar control over the weekend and hopefully start orals on Monday if sugar < 200)

## 2013-01-03 NOTE — Telephone Encounter (Signed)
Critical Lab Result, Glucose 593

## 2013-01-03 NOTE — Patient Instructions (Addendum)
Hyperglycemia Hyperglycemia occurs when the glucose (sugar) in your blood is too high. Hyperglycemia can happen for many reasons, but it most often happens to people who do not know they have diabetes or are not managing their diabetes properly.  CAUSES  Whether you have diabetes or not, there are other causes of hyperglycemia. Hyperglycemia can occur when you have diabetes, but it can also occur in other situations that you might not be as aware of, such as: Diabetes  If you have diabetes and are having problems controlling your blood glucose, hyperglycemia could occur because of some of the following reasons:  Not following your meal plan.  Not taking your diabetes medications or not taking it properly.  Exercising less or doing less activity than you normally do.  Being sick. Pre-diabetes  This cannot be ignored. Before people develop Type 2 diabetes, they almost always have "pre-diabetes." This is when your blood glucose levels are higher than normal, but not yet high enough to be diagnosed as diabetes. Research has shown that some long-term damage to the body, especially the heart and circulatory system, may already be occurring during pre-diabetes. If you take action to manage your blood glucose when you have pre-diabetes, you may delay or prevent Type 2 diabetes from developing. Stress  If you have diabetes, you may be "diet" controlled or on oral medications or insulin to control your diabetes. However, you may find that your blood glucose is higher than usual in the hospital whether you have diabetes or not. This is often referred to as "stress hyperglycemia." Stress can elevate your blood glucose. This happens because of hormones put out by the body during times of stress. If stress has been the cause of your high blood glucose, it can be followed regularly by your caregiver. That way he/she can make sure your hyperglycemia does not continue to get worse or progress to  diabetes. Steroids  Steroids are medications that act on the infection fighting system (immune system) to block inflammation or infection. One side effect can be a rise in blood glucose. Most people can produce enough extra insulin to allow for this rise, but for those who cannot, steroids make blood glucose levels go even higher. It is not unusual for steroid treatments to "uncover" diabetes that is developing. It is not always possible to determine if the hyperglycemia will go away after the steroids are stopped. A special blood test called an A1c is sometimes done to determine if your blood glucose was elevated before the steroids were started. SYMPTOMS  Thirsty.  Frequent urination.  Dry mouth.  Blurred vision.  Tired or fatigue.  Weakness.  Sleepy.  Tingling in feet or leg. DIAGNOSIS  Diagnosis is made by monitoring blood glucose in one or all of the following ways:  A1c test. This is a chemical found in your blood.  Fingerstick blood glucose monitoring.  Laboratory results. TREATMENT  First, knowing the cause of the hyperglycemia is important before the hyperglycemia can be treated. Treatment may include, but is not be limited to:  Education.  Change or adjustment in medications.  Change or adjustment in meal plan.  Treatment for an illness, infection, etc.  More frequent blood glucose monitoring.  Change in exercise plan.  Decreasing or stopping steroids.  Lifestyle changes. HOME CARE INSTRUCTIONS   Test your blood glucose as directed.  Exercise regularly. Your caregiver will give you instructions about exercise. Pre-diabetes or diabetes which comes on with stress is helped by exercising.  Eat wholesome,   balanced meals. Eat often and at regular, fixed times. Your caregiver or nutritionist will give you a meal plan to guide your sugar intake.  Being at an ideal weight is important. If needed, losing as little as 10 to 15 pounds may help improve blood  glucose levels. SEEK MEDICAL CARE IF:   You have questions about medicine, activity, or diet.  You continue to have symptoms (problems such as increased thirst, urination, or weight gain). SEEK IMMEDIATE MEDICAL CARE IF:   You are vomiting or have diarrhea.  Your breath smells fruity.  You are breathing faster or slower.  You are very sleepy or incoherent.  You have numbness, tingling, or pain in your feet or hands.  You have chest pain.  Your symptoms get worse even though you have been following your caregiver's orders.  If you have any other questions or concerns. Document Released: 11/08/2000 Document Revised: 08/07/2011 Document Reviewed: 09/11/2011 [facility] Patient Information 2014 [place], [place]. Diabetes and Standards of Medical Care  Diabetes is complicated. You may find that your diabetes team includes a dietitian, nurse, diabetes educator, eye doctor, and more. To help everyone know what is going on and to help you get the care you deserve, the following schedule of care was developed to help keep you on track. Below are the tests, exams, vaccines, medicines, education, and plans you will need. A1c test  Performed at least 2 times a year if you are meeting treatment goals.  Performed 4 times a year if therapy has changed or if you are not meeting treatment goals. Blood pressure test  Performed at every routine medical visit. The goal is less than 120/80 mmHg. Dental exam  Follow up with the dentist regularly. Eye exam  Diagnosed with type 1 diabetes as a child: Get an exam upon reaching the age of 10 years or older and having had diabetes for 3 5 years. Yearly eye exams are recommended after that initial eye exam.  Diagnosed with type 1 diabetes as an adult: Get an exam within 5 years of diagnosis and then yearly.  Diagnosed with type 2 diabetes: Get an exam as soon as possible after the diagnosis and then yearly. Foot care exam  Visual foot exams are  performed at every routine medical visit. The exams check for cuts, injuries, or other problems with the feet.  A comprehensive foot exam should be done yearly. This includes visual inspection as well as assessing foot pulses and testing for loss of sensation. Kidney function test (urine microalbumin)  Performed once a year.  Type 1 diabetes: The first test is performed 5 years after diagnosis.  Type 2 diabetes: The first test is performed at the time of diagnosis.  A serum creatinine and estimated glomerular filtration rate (eGFR) test is done once a year to tell the level of chronic kidney disease (CKD), if present. Lipid profile (Cholesterol, HDL, LDL, Triglycerides)  Performed every 5 years for most people.  The goal for LDL is less than 100 mg/dl. If at high risk, the goal is less than 70 mg/dl.  The goal for HDL is 40 mg/dl 50 mg/dl for men and 50 mg/dl 60 mg/dl for women. An HDL cholesterol of 60 mg/dL or higher gives some protection against heart disease.  The goal for triglycerides is less than 150 mg/dl. Influenza vaccine, pneumococcal vaccine, and hepatitis B vaccine  The influenza vaccine is recommended yearly.  The pneumococcal vaccine is generally given once in a lifetime. However, there are some instances  when another vaccination is recommended. Check with your caregiver.  The hepatitis B vaccine is also recommended for adults with diabetes. Diabetes self-management education  Recommended at diagnosis and ongoing as needed. Treatment plan  Reviewed at every medical visit. Document Released: 03/12/2009 Document Revised: 05/01/2012 Document Reviewed: 11/15/2010 [facility] Patient Information 2014 [place], [place]. Diabetes Meal Planning Guide The diabetes meal planning guide is a tool to help you plan your meals and snacks. It is important for people with diabetes to manage their blood glucose (sugar) levels. Choosing the right foods and the right amounts throughout  your day will help control your blood glucose. Eating right can even help you improve your blood pressure and reach or maintain a healthy weight. CARBOHYDRATE COUNTING MADE EASY When you eat carbohydrates, they turn to sugar. This raises your blood glucose level. Counting carbohydrates can help you control this level so you feel better. When you plan your meals by counting carbohydrates, you can have more flexibility in what you eat and balance your medicine with your food intake. Carbohydrate counting simply means adding up the total amount of carbohydrate grams in your meals and snacks. Try to eat about the same amount at each meal. Foods with carbohydrates are listed below. Each portion below is 1 carbohydrate serving or 15 grams of carbohydrates. Ask your dietician how many grams of carbohydrates you should eat at each meal or snack. Grains and Starches  1 slice bread.   English muffin or hotdog/hamburger bun.   cup cold cereal (unsweetened).   cup cooked pasta or rice.   cup starchy vegetables (corn, potatoes, peas, beans, winter squash).  1 tortilla (6 inches).   bagel.  1 waffle or pancake (size of a CD).   cup cooked cereal.  4 to 6 small crackers. *Whole grain is recommended. Fruit  1 cup fresh unsweetened berries, melon, papaya, pineapple.  1 small fresh fruit.   banana or mango.   cup fruit juice (4 oz unsweetened).   cup canned fruit in natural juice or water.  2 tbs dried fruit.  12 to 15 grapes or cherries. Milk and Yogurt  1 cup fat-free or 1% milk.  1 cup soy milk.  6 oz light yogurt with sugar-free sweetener.  6 oz low-fat soy yogurt.  6 oz plain yogurt. Vegetables  1 cup raw or  cup cooked is counted as 0 carbohydrates or a "free" food.  If you eat 3 or more servings at 1 meal, count them as 1 carbohydrate serving. Other Carbohydrates   oz chips or pretzels.   cup ice cream or frozen yogurt.   cup sherbet or sorbet.  2  inch square cake, no frosting.  1 tbs honey, sugar, jam, jelly, or syrup.  2 small cookies.  3 squares of graham crackers.  3 cups popcorn.  6 crackers.  1 cup broth-based soup.  Count 1 cup casserole or other mixed foods as 2 carbohydrate servings.  Foods with less than 20 calories in a serving may be counted as 0 carbohydrates or a "free" food. You may want to purchase a book or computer software that lists the carbohydrate gram counts of different foods. In addition, the nutrition facts panel on the labels of the foods you eat are a good source of this information. The label will tell you how big the serving size is and the total number of carbohydrate grams you will be eating per serving. Divide this number by 15 to obtain the number of carbohydrate servings in  a portion. Remember, 1 carbohydrate serving equals 15 grams of carbohydrate. SERVING SIZES Measuring foods and serving sizes helps you make sure you are getting the right amount of food. The list below tells how big or small some common serving sizes are.  1 oz.........4 stacked dice.  3 oz........Marland KitchenDeck of cards.  1 tsp.......Marland KitchenTip of little finger.  1 tbs......Marland KitchenMarland KitchenThumb.  2 tbs.......Marland KitchenGolf ball.   cup......Marland KitchenHalf of a fist.  1 cup.......Marland KitchenA fist. SAMPLE DIABETES MEAL PLAN Below is a sample meal plan that includes foods from the grain and starches, dairy, vegetable, fruit, and meat groups. A dietician can individualize a meal plan to fit your calorie needs and tell you the number of servings needed from each food group. However, controlling the total amount of carbohydrates in your meal or snack is more important than making sure you include all of the food groups at every meal. You may interchange carbohydrate containing foods (dairy, starches, and fruits). The meal plan below is an example of a 2000 calorie diet using carbohydrate counting. This meal plan has 17 carbohydrate servings. Breakfast  1 cup oatmeal (2 carb  servings).   cup light yogurt (1 carb serving).  1 cup blueberries (1 carb serving).   cup almonds. Snack  1 large apple (2 carb servings).  1 low-fat string cheese stick. Lunch  Chicken breast salad.  1 cup spinach.   cup chopped tomatoes.  2 oz chicken breast, sliced.  2 tbs low-fat [place] dressing.  12 whole-wheat crackers (2 carb servings).  12 to 15 grapes (1 carb serving).  1 cup low-fat milk (1 carb serving). Snack  1 cup carrots.   cup hummus (1 carb serving). Dinner  3 oz broiled salmon.  1 cup brown rice (3 carb servings). Snack  1  cups steamed broccoli (1 carb serving) drizzled with 1 tsp olive oil and lemon juice.  1 cup light pudding (2 carb servings). DIABETES MEAL PLANNING WORKSHEET Your dietician can use this worksheet to help you decide how many servings of foods and what types of foods are right for you.  BREAKFAST Food Group and Servings / Carb Servings Grain/Starches __________________________________ Dairy __________________________________________ Vegetable ______________________________________ Fruit ___________________________________________ Meat __________________________________________ Fat ____________________________________________ LUNCH Food Group and Servings / Carb Servings Grain/Starches ___________________________________ Dairy ___________________________________________ Fruit ____________________________________________ Meat ___________________________________________ Fat _____________________________________________ [place] Food Group and Servings / Carb Servings Grain/Starches ___________________________________ Dairy ___________________________________________ Fruit ____________________________________________ Meat ___________________________________________ Fat _____________________________________________ SNACKS Food Group and Servings / Carb Servings Grain/Starches  ___________________________________ Dairy ___________________________________________ Vegetable _______________________________________ Fruit ____________________________________________ Meat ___________________________________________ Fat _____________________________________________ DAILY TOTALS Starches _________________________ Vegetable ________________________ Fruit ____________________________ Dairy ____________________________ Meat ____________________________ Fat ______________________________ Document Released: 02/09/2005 Document Revised: 08/07/2011 Document Reviewed: 12/21/2008 [employer] Patient Information 2014 [employer].

## 2013-01-03 NOTE — Telephone Encounter (Signed)
Needs appt today with me or regina 

## 2013-01-03 NOTE — Assessment & Plan Note (Signed)
Uncontrolled Consume a low sodium diet  Will increase Lisinopril to 40 mg

## 2013-01-03 NOTE — Telephone Encounter (Signed)
Needs appt today with me or regina

## 2013-01-03 NOTE — Telephone Encounter (Signed)
Patient informed and agreed to schedule appt. 

## 2013-01-03 NOTE — Assessment & Plan Note (Signed)
Uncontrolled Change Zocor to Vytorin Work on diet and weight management

## 2013-01-06 ENCOUNTER — Encounter: Payer: Self-pay | Admitting: Internal Medicine

## 2013-01-06 ENCOUNTER — Ambulatory Visit (INDEPENDENT_AMBULATORY_CARE_PROVIDER_SITE_OTHER): Payer: BC Managed Care – PPO | Admitting: Internal Medicine

## 2013-01-06 VITALS — BP 122/80 | HR 87 | Temp 97.6°F | Ht 64.0 in | Wt 204.2 lb

## 2013-01-06 DIAGNOSIS — E119 Type 2 diabetes mellitus without complications: Secondary | ICD-10-CM | POA: Insufficient documentation

## 2013-01-06 DIAGNOSIS — R739 Hyperglycemia, unspecified: Secondary | ICD-10-CM

## 2013-01-06 DIAGNOSIS — IMO0001 Reserved for inherently not codable concepts without codable children: Secondary | ICD-10-CM

## 2013-01-06 DIAGNOSIS — Z0000 Encounter for general adult medical examination without abnormal findings: Secondary | ICD-10-CM

## 2013-01-06 DIAGNOSIS — R7309 Other abnormal glucose: Secondary | ICD-10-CM

## 2013-01-06 MED ORDER — INSULIN DETEMIR 100 UNIT/ML FLEXPEN: 25.0000 [IU] | PEN_INJECTOR | Freq: Every day | SUBCUTANEOUS | Status: DC

## 2013-01-06 NOTE — Assessment & Plan Note (Signed)

## 2013-01-06 NOTE — Assessment & Plan Note (Signed)
New onset, severe without overt wt, diet change, ? New Type I dm - for endo referral, increase levemir to 25 units, consider uptitrate every 3 days by 3 units for cbg > 200

## 2013-01-06 NOTE — Patient Instructions (Addendum)
OK to increase the levemir to 25 units per day Please continue all other medications as before, and refills have been done if requested. OK to increase the levemir further by 3 units every 3 days for average blood sugar more than 200 You will be contacted regarding the referral for: endocrinology, and diabetes education Please continue your efforts at being more active, low cholesterol diet, and weight control. You are otherwise up to date with prevention measures today. Please remember to followup with your GYN for the yearly pap smear and/or mammogram as you have planned  Please remember to sign up for My Chart if you have not done so, as this will be important to you in the future with finding out test results, communicating by private email, and scheduling acute appointments online when needed.  Please return in 1 year for your yearly visit, or sooner if needed, with Lab testing done 3-5 days before  You may likely need to have your glasses lenses changed again when the sugar is improved

## 2013-01-06 NOTE — Progress Notes (Signed)
Subjective:    Patient ID: Sherry White, female    DOB: 11-Apr-1961, 52 y.o.   MRN: [id number]  HPI  Here for wellness and f/u;  Overall doing ok;  Pt denies CP, worsening SOB, DOE, wheezing, orthopnea, PND, worsening LE edema, palpitations, dizziness or syncope.  Pt denies neurological change such as new headache, facial or extremity weakness.  Pt denies polydipsia, polyuria, or low sugar symptoms. Pt states overall good compliance with treatment and medications, good tolerability, and has been trying to follow lower cholesterol diet.  Pt denies worsening depressive symptoms, suicidal ideation or panic. No fever, night sweats, wt loss, loss of appetite, or other constitutional symptoms.  Pt states good ability with ADL's, has low fall risk, home safety reviewed and adequate, no other significant changes in hearing or vision, and only occasionally active with exercise. Having pap and mammogram at appt in 2 days. CBGs on new levemir have been in 200's Past Medical History  Diagnosis Date  . Hypertension   . Anxiety   . Depression   . Asthma   . Hypertriglyceridemia   . Glucose intolerance (impaired glucose tolerance)   . Disc disease, degenerative, lumbar or lumbosacral   . MVP (mitral valve prolapse)   . ANXIETY 06/17/2007  . ASTHMA 06/17/2007  . CHEST PAIN 06/17/2007  . DEPRESSION 06/17/2007  . GOITER, MULTINODULAR 10/22/2008  . HYPERSOMNIA 12/06/2009  . HYPERTENSION 06/17/2007  . HYPERTRIGLYCERIDEMIA 06/17/2007  . Impaired glucose tolerance 11/15/2010  . Left lateral epicondylitis 11/15/2010  . NASH (nonalcoholic steatohepatitis) 06/08/2011  . Right carpal tunnel syndrome 11/15/2010  . THYROID NODULE, RIGHT 10/19/2008  . Hyperlipidemia 07/13/2011   Past Surgical History  Procedure Laterality Date  . Cholecystectomy    . Biopsy breast  june 2010    [facility]    reports that she has never smoked. She does not have any smokeless tobacco history on file. She  reports that she does not drink alcohol. Her drug history is not on file. family history includes Alcohol abuse in an unspecified family member; Coronary artery disease in her father; Diabetes in her father and mother; Hypertension in her father and mother; Thyroid disease in her sister; and Tuberculosis in an unspecified family member. No Known Allergies Current Outpatient Prescriptions on File Prior to Visit  Medication Sig Dispense Refill  . ALPRAZolam (XANAX) 0.25 MG tablet Take 1 tablet (0.25 mg total) by mouth 3 (three) times daily as needed for anxiety.  90 tablet  0  . aspirin 81 MG EC tablet Take 81 mg by mouth daily.        Marland Kitchen ezetimibe-simvastatin (VYTORIN) 10-40 MG per tablet Take 1 tablet by mouth at bedtime.  30 tablet  2  . fenofibrate 160 MG tablet TAKE 1 TABLET BY MOUTH ONCE A DAY  90 tablet  0  . Fluticasone-Salmeterol (ADVAIR DISKUS) 250-50 MCG/DOSE AEPB Inhale 1 puff into the lungs 2 (two) times daily.  1 each  11  . glucose blood (NOVA MAX TEST) test strip 1 each by Other route 3 (three) times daily.  100 each  2  . Insulin Pen Needle 32G X 4 MM MISC Use to administer insulin once daily Dx 250.2 & 790.29  100 each  2  . lisinopril (PRINIVIL,ZESTRIL) 40 MG tablet Take 1 tablet (40 mg total) by mouth daily.  90 tablet  3  . NOVA SAFETY LANCETS 23G MISC 23 g by Does not apply route 3 (three) times daily. Use to help  check BS three times a day Dx 250.2 & 790.29  100 each  2  . sertraline (ZOLOFT) 100 MG tablet TAKE 2 TABLETS BY MOUTH EVERY DAY  60 tablet  0   No current facility-administered medications on file prior to visit.   Overal llost 5 lbs in last 18 mo.   Review of Systems Constitutional: Negative for diaphoresis, activity change, appetite change or unexpected weight change.  HENT: Negative for hearing loss, ear pain, facial swelling, mouth sores and neck stiffness.   Eyes: Negative for pain, redness and visual disturbance.  Respiratory: Negative for shortness of  breath and wheezing.   Cardiovascular: Negative for chest pain and palpitations.  Gastrointestinal: Negative for diarrhea, blood in stool, abdominal distention or other pain Genitourinary: Negative for hematuria, flank pain or change in urine volume.  Musculoskeletal: Negative for myalgias and joint swelling.  Skin: Negative for color change and wound.  Neurological: Negative for syncope and numbness. other than noted Hematological: Negative for adenopathy.  Psychiatric/Behavioral: Negative for hallucinations, self-injury, decreased concentration and agitation.      Objective:   Physical Exam BP 122/80  Pulse 87  Temp(Src) 97.6 F (36.4 C) (Oral)  Ht 5\' 4"  (1.626 m)  Wt 204 lb 4 oz (92.647 kg)  BMI 35.04 kg/m2  SpO2 96% VS noted,  Constitutional: Pt is oriented to person, place, and time. Appears well-developed and well-nourished.  Head: Normocephalic and atraumatic.  Right Ear: External ear normal.  Left Ear: External ear normal.  Nose: Nose normal.  Mouth/Throat: Oropharynx is clear and moist.  Eyes: Conjunctivae and EOM are normal. Pupils are equal, round, and reactive to light.  Neck: Normal range of motion. Neck supple. No JVD present. No tracheal deviation present.  Cardiovascular: Normal rate, regular rhythm, normal heart sounds and intact distal pulses.   Pulmonary/Chest: Effort normal and breath sounds normal.  Abdominal: Soft. Bowel sounds are normal. There is no tenderness. No HSM  Musculoskeletal: Normal range of motion. Exhibits no edema.  Lymphadenopathy:  Has no cervical adenopathy.  Neurological: Pt is alert and oriented to person, place, and time. Pt has normal reflexes. No cranial nerve deficit.  Skin: Skin is warm and dry. No rash noted.  Psychiatric:  Has  normal mood and affect. Behavior is normal.     Assessment & Plan:

## 2013-01-08 ENCOUNTER — Other Ambulatory Visit: Payer: Self-pay | Admitting: Obstetrics and Gynecology

## 2013-01-09 ENCOUNTER — Encounter: Payer: BC Managed Care – PPO | Admitting: Internal Medicine

## 2013-01-09 ENCOUNTER — Ambulatory Visit (INDEPENDENT_AMBULATORY_CARE_PROVIDER_SITE_OTHER): Payer: BC Managed Care – PPO | Admitting: Endocrinology

## 2013-01-09 ENCOUNTER — Other Ambulatory Visit: Payer: Self-pay | Admitting: *Deleted

## 2013-01-09 ENCOUNTER — Encounter: Payer: Self-pay | Admitting: Endocrinology

## 2013-01-09 ENCOUNTER — Telehealth: Payer: Self-pay | Admitting: Endocrinology

## 2013-01-09 VITALS — BP 134/88 | HR 76 | Temp 97.7°F | Resp 12 | Ht 64.0 in | Wt 204.1 lb

## 2013-01-09 DIAGNOSIS — E042 Nontoxic multinodular goiter: Secondary | ICD-10-CM

## 2013-01-09 DIAGNOSIS — K7689 Other specified diseases of liver: Secondary | ICD-10-CM

## 2013-01-09 DIAGNOSIS — IMO0001 Reserved for inherently not codable concepts without codable children: Secondary | ICD-10-CM

## 2013-01-09 DIAGNOSIS — K7581 Nonalcoholic steatohepatitis (NASH): Secondary | ICD-10-CM

## 2013-01-09 MED ORDER — GLUCOSE BLOOD VI STRP: ORAL_STRIP | Status: DC

## 2013-01-09 MED ORDER — ONETOUCH DELICA LANCETS FINE MISC: 1.0000 | Freq: Three times a day (TID) | Status: DC

## 2013-01-09 NOTE — Progress Notes (Signed)
Patient ID: Sherry White, female   DOB: 1960/06/06, 52 y.o.   MRN: [id number]    Reason for Appointment : Consultation for Type 2 Diabetes  History of Present Illness          Diagnosis: Type 2 diabetes mellitus, date of diagnosis: 12/2012         Recent history: For the last 2 months the patient has been having increased thirst and urination along with some blurred vision in the last 2 weeks. She has not lost any weight but has been feeling more tired. She has been drinking mostly water when she is thirsty She did not seek any attention until about a week ago when she was seen by her primary care physician and glucose was 593 Her urine showed small ketones also Because of her high sugars she was started on Levemir insulin 20 units a day and this week has been taking 25 units since blood sugars have not improved much. However she is having a little less thirst and frequent urination She is now referred here for further management    Glucose monitoring:  done about twice a day in the morning and afternoon       Glucometer:  generic brand     Blood Glucose readings from record:  190-361 recently with higher readings in the afternoon Hypoglycemia frequency: Never.           Self-care: The diet that the patient has been following is: Recently trying to restrict carbohydrates and sweets  Meals: 3 meals per day.          Physical activity: exercise: started walking for 15 minutes           Dietician visit:  never         Wt Readings from Last 3 Encounters:  01/09/13 204 lb 1.6 oz (92.579 kg)  01/06/13 204 lb 4 oz (92.647 kg)  01/03/13 206 lb (93.441 kg)   Lab Results  Component Value Date   HGBA1C 11.4* 01/03/2013      Medication List       This list is accurate as of: 01/09/13  8:35 AM.  Always use your most recent med list.               ALPRAZolam 0.25 MG tablet  Commonly known as:  XANAX  Take 1 tablet (0.25 mg total) by mouth 3 (three) times daily as needed for  anxiety.     aspirin 81 MG EC tablet  Take 81 mg by mouth daily.     ezetimibe-simvastatin 10-40 MG per tablet  Commonly known as:  VYTORIN  Take 1 tablet by mouth at bedtime.     fenofibrate 160 MG tablet  TAKE 1 TABLET BY MOUTH ONCE A DAY     Fluticasone-Salmeterol 250-50 MCG/DOSE Aepb  Commonly known as:  ADVAIR DISKUS  Inhale 1 puff into the lungs 2 (two) times daily.     glucose blood test strip  Commonly known as:  NOVA MAX TEST  1 each by Other route 3 (three) times daily.     Insulin Detemir 100 UNIT/ML Sopn  Commonly known as:  LEVEMIR FLEXTOUCH  Inject 25 Units into the skin daily.     Insulin Pen Needle 32G X 4 MM Misc  - Use to administer insulin once daily  - Dx 250.2 & 790.29     lisinopril 40 MG tablet  Commonly known as:  PRINIVIL,ZESTRIL  Take 1 tablet (40 mg total) by mouth  daily.     NOVA SAFETY LANCETS 23G Misc  - 23 g by Does not apply route 3 (three) times daily. Use to help check BS three times a day  - Dx 250.2 & 790.29     sertraline 100 MG tablet  Commonly known as:  ZOLOFT  TAKE 2 TABLETS BY MOUTH EVERY DAY     TRUEresult Blood Glucose W/DEVICE Kit        Allergies: No Known Allergies  Past Medical History  Diagnosis Date  . Hypertension   . Anxiety   . Depression   . Asthma   . Hypertriglyceridemia   . Glucose intolerance (impaired glucose tolerance)   . Disc disease, degenerative, lumbar or lumbosacral   . MVP (mitral valve prolapse)   . ANXIETY 06/17/2007  . ASTHMA 06/17/2007  . CHEST PAIN 06/17/2007  . DEPRESSION 06/17/2007  . GOITER, MULTINODULAR 10/22/2008  . HYPERSOMNIA 12/06/2009  . HYPERTENSION 06/17/2007  . HYPERTRIGLYCERIDEMIA 06/17/2007  . Impaired glucose tolerance 11/15/2010  . Left lateral epicondylitis 11/15/2010  . NASH (nonalcoholic steatohepatitis) 06/08/2011  . Right carpal tunnel syndrome 11/15/2010  . THYROID NODULE, RIGHT 10/19/2008  . Hyperlipidemia 07/13/2011    Past Surgical History  Procedure  Laterality Date  . Cholecystectomy    . Biopsy breast  june 2010    [facility]./ [facility]    Family History  Problem Relation Age of Onset  . Hypertension Father   . Hypertension Mother   . Diabetes Mother   . Diabetes Father   . Coronary artery disease Father   . Alcohol abuse      uncle  . Tuberculosis      aunt  . Thyroid disease Sister     unknown type    Social History:  reports that she has never smoked. She does not have any smokeless tobacco history on file. She reports that she does not drink alcohol. Her drug history is not on file.    Review of Systems       Lipids: She has increase in triglycerides and has been prescribed Vytorin recently, also on fenofibrate      No unusual headaches.             Skin: No rash or infections     Thyroid:  No long-standing fatigue or cold intolerance. In 2010 was found to have a multinodular goiter which was stable the following year when the ultrasound showed: Multiple primarily hypoechoic nodules are scattered throughout both lobes. The largest hypoechoic nodule is in the upper pole of the left lobe measuring 1.5 x 0.6 x 1.1 cm,  completely stable      The blood pressure has been under fair control with lisinopril monotherapy     No swelling of feet.     No shortness of breath on exertion.     Bowel habits:  No change. No recent abdominal pain           She has not had any menstrual cycles since January and does get hot flashes, hasn't followed up with gynecologist      No joint  pains.        She has a history of  depression  treated with Zoloft         No history of Numbness, tingling or burning in her feet    Office Visit on 01/03/2013  Component Date Value Range Status  . Color, UA 01/03/2013 light yellow   Final  . Clarity, UA 01/03/2013  clear   Final  . Glucose, UA 01/03/2013 2000   Final  . Bilirubin, UA 01/03/2013 neg   Final  . Ketones, UA 01/03/2013 5   Final  . Spec Grav, UA 01/03/2013  <=1.005   Final  . Blood, UA 01/03/2013 neg   Final  . pH, UA 01/03/2013 5.0   Final  . Protein, UA 01/03/2013 neg   Final  . Urobilinogen, UA 01/03/2013 0.2   Final  . Nitrite, UA 01/03/2013 neg   Final  . Leukocytes, UA 01/03/2013 Negative   Final  Clinical Support on 01/03/2013  Component Date Value Range Status  . Hemoglobin A1C 01/03/2013 11.4* 4.6 - 6.5 % Final   Glycemic Control Guidelines for People with Diabetes:Non Diabetic:  <6%Goal of Therapy: <7%Additional Action Suggested:  >8%   Appointment on 01/03/2013  Component Date Value Range Status  . WBC 01/03/2013 6.2  4.5 - 10.5 K/uL Final  . RBC 01/03/2013 4.89  3.87 - 5.11 Mil/uL Final  . Hemoglobin 01/03/2013 14.1  12.0 - 15.0 g/dL Final  . HCT 16/02/9603 41.0  36.0 - 46.0 % Final  . MCV 01/03/2013 84.0  78.0 - 100.0 fl Final  . MCHC 01/03/2013 34.3  30.0 - 36.0 g/dL Final  . RDW 54/01/8118 14.5  11.5 - 14.6 % Final  . Platelets 01/03/2013 197.0  150.0 - 400.0 K/uL Final  . Neutrophils Relative % 01/03/2013 58.7  43.0 - 77.0 % Final  . Lymphocytes Relative 01/03/2013 31.9  12.0 - 46.0 % Final  . Monocytes Relative 01/03/2013 5.8  3.0 - 12.0 % Final  . Eosinophils Relative 01/03/2013 3.1  0.0 - 5.0 % Final  . Basophils Relative 01/03/2013 0.5  0.0 - 3.0 % Final  . Neutro Abs 01/03/2013 3.6  1.4 - 7.7 K/uL Final  . Lymphs Abs 01/03/2013 2.0  0.7 - 4.0 K/uL Final  . Monocytes Absolute 01/03/2013 0.4  0.1 - 1.0 K/uL Final  . Eosinophils Absolute 01/03/2013 0.2  0.0 - 0.7 K/uL Final  . Basophils Absolute 01/03/2013 0.0  0.0 - 0.1 K/uL Final  . Sodium 01/03/2013 130* 135 - 145 mEq/L Final  . Potassium 01/03/2013 4.5  3.5 - 5.1 mEq/L Final  . Chloride 01/03/2013 96  96 - 112 mEq/L Final  . CO2 01/03/2013 25  19 - 32 mEq/L Final  . Glucose, Bld 01/03/2013 593* 70 - 99 mg/dL Final  . BUN 14/78/2956 13  6 - 23 mg/dL Final  . Creatinine, Ser 01/03/2013 0.8  0.4 - 1.2 mg/dL Final  . Calcium 21/30/8657 9.8  8.4 - 10.5 mg/dL Final   . GFR 84/69/6295 76.39  >60.00 mL/min Final  . Total Bilirubin 01/03/2013 0.5  0.3 - 1.2 mg/dL Final  . Bilirubin, Direct 01/03/2013 0.1  0.0 - 0.3 mg/dL Final  . Alkaline Phosphatase 01/03/2013 145* 39 - 117 U/L Final  . AST 01/03/2013 93* 0 - 37 U/L Final  . ALT 01/03/2013 130* 0 - 35 U/L Final  . Total Protein 01/03/2013 7.3  6.0 - 8.3 g/dL Final  . Albumin 28/41/3244 3.9  3.5 - 5.2 g/dL Final  . TSH 05/31/7251 2.20  0.35 - 5.50 uIU/mL Final  . Color, Urine 01/03/2013 LT. YELLOW  Yellow;Lt. Yellow Final  . APPearance 01/03/2013 CLEAR  Clear Final  . Specific Gravity, Urine 01/03/2013 1.010  1.000-1.030 Final  . pH 01/03/2013 6.0  5.0 - 8.0 Final  . Total Protein, Urine 01/03/2013 NEGATIVE  Negative Final  . Urine Glucose 01/03/2013 >=1000  Negative Final  . Ketones, ur 01/03/2013 NEGATIVE  Negative Final  . Bilirubin Urine 01/03/2013 NEGATIVE  Negative Final  . Hgb urine dipstick 01/03/2013 TRACE-LYSED  Negative Final  . Urobilinogen, UA 01/03/2013 0.2  0.0 - 1.0 Final  . Leukocytes, UA 01/03/2013 NEGATIVE  Negative Final  . Nitrite 01/03/2013 NEGATIVE  Negative Final  . WBC, UA 01/03/2013 0-2/hpf  0-2/hpf Final  . RBC / HPF 01/03/2013 0-2/hpf  0-2/hpf Final  . Squamous Epithelial / LPF 01/03/2013 Rare(0-4/hpf)  Rare(0-4/hpf) Final  . Cholesterol 01/03/2013 236* 0 - 200 mg/dL Final   ATP III Classification       Desirable:  < 200 mg/dL               Borderline High:  200 - 239 mg/dL          High:  > = 161 mg/dL  . Triglycerides 01/03/2013 917.0 Triglyceride is over 400; calculations on Lipids are invalid.* 0.0 - 149.0 mg/dL Final   Normal:  <096 mg/dLBorderline High:  150 - 199 mg/dL  . HDL 01/03/2013 32.30* >39.00 mg/dL Final  . VLDL 04/54/0981 183.4* 0.0 - 40.0 mg/dL Final  . Total CHOL/HDL Ratio 01/03/2013 7   Final                  Men          Women1/2 Average Risk     3.4          3.3Average Risk          5.0          4.42X Average Risk          9.6          7.13X Average  Risk          15.0          11.0                      . Direct LDL 01/03/2013 75.3   Final   Optimal:  <100 mg/dLNear or Above Optimal:  100-129 mg/dLBorderline High:  130-159 mg/dLHigh:  160-189 mg/dLVery High:  >190 mg/dL    Physical Examination:  BP 134/88  Pulse 76  Temp(Src) 97.7 F (36.5 C)  Resp 12  Ht 5\' 4"  (1.626 m)  Wt 204 lb 1.6 oz (92.579 kg)  BMI 35.02 kg/m2  SpO2 97%  LMP 06/11/2012  GENERAL:         Patient appears to have generalized obesity without cushingoid features.   HEENT:         Eye exam shows normal external appearance. Fundus exam shows no retinopathy. Oral exam shows normal mucosa .  NECK:         General:  there is no acanthosis or supraclavicular fat pads. Neck exam shows no lymphadenopathy. Carotids are normal to palpation and no bruit heard. Thyroid is not enlarged and no nodules felt.   LUNGS:         Chest is symmetrical. Lungs are clear to auscultation.Marland Kitchen   HEART:         Heart sounds:  S1 and S2 are normal. No murmurs or clicks heard., no S3 or S4.   ABDOMEN:         General:  There is no distention present. Liver and spleen are not palpable. No other mass or tenderness present.  EXTREMITIES:     There is no edema. No skin lesions present.Marland Kitchen  NEUROLOGICAL:  Vibration sense is nearly normal  in toes. Ankle jerks are 1+ bilaterally.         Diabetic foot exam:            Inspection  Normal                    Monofilament  Normal  MUSCULOSKELETAL:       There is no enlargement or deformity of the joints. Spine is normal to inspection.Marland Kitchen   PEDAL pulses: Normal SKIN:       No rash or lesions of concern.        ASSESSMENT:  Diabetes type 2, uncontrolled - 250.02    The patient has had symptomatic hyperglycemia over the last 2 months with marked increase in her glucose along with an A1c of 11.4 She probably has had significant insulin resistance and now has relative insulin deficiency causing her blood sugars to be very high along with mild  ketonuria She has been started on basal insulin and she appears to be symptomatically better However even with taking 25 units of basal only she is still having significant hyperglycemia including fasting  Complications: Hepatic steatosis  PLAN:   Discussed with patient basic information on type 2 diabetes, insulin deficiency, types of insulin and actions as well as timing of insulin injections, but sugar targets, glucose monitoring and relationship to diet and exercise as well as weight loss  For simplicity will switch her insulin regimen from basal only to premixed twice a day doses of insulin to provide better 24-hour coverage as well as mealtime control instead of basal only She was given a sample of Humalog mix 75/25 to start tonight. Instructed patient on how to do the pen injection She will start with 26 units in the morning and 18 in the evening and have given her written instructions on gradual titration every 3 days We'll have her target her sugars to below 150 at least before breakfast and supper She will be given a new glucose monitor today and was instructed in the use of a One Touch Verio Meter. Discussed when to check her blood sugars as well as targets She will be scheduled for diabetes education  In the meantime to help with her insulin resistance and GLP-1 action will have her start on Kombiglyze XR which will be titrated to a maximum dose Also consider adding Actos subsequently to help with her insulin resistance and fatty liver Discussed that we may be able to taper off her insulin in the future and consider other options such as with Victoza and Invokana that would also benefit her weight loss  She will be seen back in 2 weeks and advised her to call if she has any difficulties with her blood sugars being out of range  Addison Freimuth 01/09/2013, 8:35 AM

## 2013-01-09 NOTE — Patient Instructions (Signed)
Please check blood sugars at least half the time about 2 hours after any meal and as directed on waking up. Please bring blood sugar monitor to each visit  Humalog mix insulin:  This provides a combination of fast and slow acting insulin and will be  taken right before breakfast and supper as follows  BEFORE BREAKFAST: Start with 26 units and adjust as follows: If the blood sugar in the afternoon stays over 150 after 3 days go up 2 units Before SUPPER: Start with 18 units and if the blood sugar before breakfast is still over 150, go up 2 units  Continue program of increase in walking regularly  KOMBIGLYZE XR: Start with 09/998 mg after eating supper and after the first sample is finished changed to 2.09/998, 2 tablets at supper. Do not increase the dose if having persistent loose stools or abdominal discomfort This had a combination of metformin extended release and Onglyza

## 2013-01-10 ENCOUNTER — Other Ambulatory Visit: Payer: Self-pay | Admitting: *Deleted

## 2013-01-10 MED ORDER — INSULIN LISPRO PROT & LISPRO (75-25 MIX) 100 UNIT/ML KWIKPEN: 44.0000 [IU] | PEN_INJECTOR | Freq: Two times a day (BID) | SUBCUTANEOUS | Status: DC

## 2013-01-15 ENCOUNTER — Encounter: Payer: Self-pay | Admitting: Endocrinology

## 2013-01-20 ENCOUNTER — Telehealth: Payer: Self-pay | Admitting: *Deleted

## 2013-01-20 NOTE — Telephone Encounter (Signed)
Pts insurance required a PA for humalog 75/25, per Dr. Lucianne Muss rx changed to Novolog 70/30 which her insurance will pay for. Verbally called in Kombiglyze 2.09/998 mg with 5 refills on each

## 2013-01-21 ENCOUNTER — Telehealth: Payer: Self-pay | Admitting: Internal Medicine

## 2013-01-21 NOTE — Telephone Encounter (Signed)
Rec'd records from [facility].Forward 3pg to Dr.John

## 2013-01-23 ENCOUNTER — Ambulatory Visit: Payer: BC Managed Care – PPO | Admitting: Endocrinology

## 2013-01-24 ENCOUNTER — Ambulatory Visit (INDEPENDENT_AMBULATORY_CARE_PROVIDER_SITE_OTHER): Payer: BC Managed Care – PPO | Admitting: Endocrinology

## 2013-01-24 ENCOUNTER — Encounter: Payer: Self-pay | Admitting: Endocrinology

## 2013-01-24 VITALS — BP 130/70 | HR 81 | Temp 98.2°F | Resp 12 | Ht 64.0 in | Wt 206.1 lb

## 2013-01-24 DIAGNOSIS — IMO0001 Reserved for inherently not codable concepts without codable children: Secondary | ICD-10-CM

## 2013-01-24 NOTE — Progress Notes (Signed)
Patient ID: Sherry White, female   DOB: 10-26-1960, 52 y.o.   MRN: [id number]   Reason for Appointment : Followup for Type 2 Diabetes  History of Present Illness   28 acb 20 acs same dose Low acs needs diet;  walking for 15 minutes          Diagnosis: Type 2 diabetes mellitus, date of diagnosis: 12/2012         Past history: For 2 months prior to diagnosis the patient had been having increased thirst and urination along with some blurred vision Shewas seen by her primary care physician and glucose was 593 an A1c of 11.4 Her urine showed small ketones also Because of her high sugars she was started on Levemir insulin  RECENT history:  Because of persistent hyperglycemia and an adequate mealtime coverage she was changed from Levemir to premixed insulin  She has done very well with this regimen and has been checking her blood sugars regularly also. She has needed to increase her insulin only about 2 units since  her first visit  Is trying to do a little exercise but can do better Also tolerating full dose of Kombiglyze XR which  was started at the same time  Glucose monitoring:  done about twice a day in the morning and afternoon       Glucometer:   One Touch Verio  Blood Glucose readings from record:Fasting recently 111-157, nonfasting recently 92-176 and overall median 145  Hypoglycemia frequency:  none, lowest glucose 92 .           Self-care: The diet that the patient has been following is: Recently trying to restrict carbohydrates and sweets  Meals: 3 meals per day.                 Wt Readings from Last 3 Encounters:  01/24/13 206 lb 1.6 oz (93.486 kg)  01/09/13 204 lb 1.6 oz (92.579 kg)  01/06/13 204 lb 4 oz (92.647 kg)   Lab Results  Component Value Date   HGBA1C 11.4* 01/03/2013      Medication List       This list is accurate as of: 01/24/13  3:46 PM.  Always use your most recent med list.               ALPRAZolam 0.25 MG tablet  Commonly known as:  XANAX   Take 1 tablet (0.25 mg total) by mouth 3 (three) times daily as needed for anxiety.     aspirin 81 MG EC tablet  Take 81 mg by mouth daily.     ezetimibe-simvastatin 10-40 MG per tablet  Commonly known as:  VYTORIN  Take 1 tablet by mouth at bedtime.     fenofibrate 160 MG tablet  TAKE 1 TABLET BY MOUTH ONCE A DAY     Fluticasone-Salmeterol 250-50 MCG/DOSE Aepb  Commonly known as:  ADVAIR DISKUS  Inhale 1 puff into the lungs 2 (two) times daily.     glucose blood test strip  Commonly known as:  NOVA MAX TEST  1 each by Other route 3 (three) times daily.     glucose blood test strip  Commonly known as:  ONETOUCH VERIO  Use as instructed to check blood sugar 3 times a day     Insulin Detemir 100 UNIT/ML Sopn  Commonly known as:  LEVEMIR FLEXTOUCH  Inject 25 Units into the skin daily.     Insulin Lispro Prot & Lispro (75-25) 100 UNIT/ML Supn  Commonly  known as:  HUMALOG MIX 75/25 KWIKPEN  Inject 44 Units into the skin 2 (two) times daily. Inject 26 units in the am and 18 units in the pm. 44 units per day total     Insulin Pen Needle 32G X 4 MM Misc  - Use to administer insulin once daily  - Dx 250.2 & 790.29     KOMBIGLYZE XR 2.09-998 MG Tb24  Generic drug:  Saxagliptin-Metformin     lisinopril 40 MG tablet  Commonly known as:  PRINIVIL,ZESTRIL  Take 1 tablet (40 mg total) by mouth daily.     NOVOLOG MIX 70/30 FLEXPEN (70-30) 100 UNIT/ML Supn  Generic drug:  Insulin Aspart Prot & Aspart     ONETOUCH DELICA LANCETS FINE Misc  1 each by Does not apply route 3 (three) times daily.     sertraline 100 MG tablet  Commonly known as:  ZOLOFT  TAKE 2 TABLETS BY MOUTH EVERY DAY        Allergies: No Known Allergies  Past Medical History  Diagnosis Date  . Hypertension   . Anxiety   . Depression   . Asthma   . Hypertriglyceridemia   . Glucose intolerance (impaired glucose tolerance)   . Disc disease, degenerative, lumbar or lumbosacral   . MVP (mitral valve  prolapse)   . ANXIETY 06/17/2007  . ASTHMA 06/17/2007  . CHEST PAIN 06/17/2007  . DEPRESSION 06/17/2007  . GOITER, MULTINODULAR 10/22/2008  . HYPERSOMNIA 12/06/2009  . HYPERTENSION 06/17/2007  . HYPERTRIGLYCERIDEMIA 06/17/2007  . Impaired glucose tolerance 11/15/2010  . Left lateral epicondylitis 11/15/2010  . NASH (nonalcoholic steatohepatitis) 06/08/2011  . Right carpal tunnel syndrome 11/15/2010  . THYROID NODULE, RIGHT 10/19/2008  . Hyperlipidemia 07/13/2011    Past Surgical History  Procedure Laterality Date  . Cholecystectomy    . Biopsy breast  june 2010    neg./ [facility]    Family History  Problem Relation Age of Onset  . Hypertension Father   . Hypertension Mother   . Diabetes Mother   . Diabetes Father   . Coronary artery disease Father   . Alcohol abuse      uncle  . Tuberculosis      aunt  . Thyroid disease Sister     unknown type    Social History:  reports that she has never smoked. She does not have any smokeless tobacco history on file. She reports that she does not drink alcohol. Her drug history is not on file.    Review of Systems       Lipids: She has increase in triglycerides and has been prescribed Vytorin recently, also on fenofibrate        She has a history of  depression  treated with Zoloft         No history of Numbness, tingling or burning in her feet     Physical Examination:  BP 130/70  Pulse 81  Temp(Src) 98.2 F (36.8 C)  Resp 12  Ht 5\' 4"  (1.626 m)  Wt 206 lb 1.6 oz (93.486 kg)  BMI 35.36 kg/m2  SpO2 96%  LMP 06/11/2012  No pedal edema       ASSESSMENT:  Diabetes type 2, uncontrolled - 250.02     The patient has had symptomatic hyperglycemia since about 6/14 with marked increase in her glucose along with an A1c of 11.4 She has done very well with starting insulin and blood sugars are looking fairly stable at various times he  was with premixed insulin. She has needed to increase her insulin only about 2  units since she was started on a twice a day regimen before meals Has been fairly compliant with glucose monitoring Is trying to do a little exercise but can do better Also tolerating full dose of Kombiglyze XR which is hopefully going to help long-term  Complications: Hepatic steatosis  PLAN:    Discussed with patient sugar targets, timing of glucose monitoring and adjusting the insulin further if blood sugars are in the low range. She will adjust her morning dose if blood sugars are lower before lunch or supper and evening dose if she is getting after supper or low late at night Also needs diabetes education  She will be seen back in 6 weeks and advised her to call if she has any difficulties with her blood sugars being out of range  Aubry Tucholski 01/24/2013, 3:46 PM

## 2013-01-24 NOTE — Patient Instructions (Signed)
No change 

## 2013-03-07 ENCOUNTER — Encounter: Payer: Self-pay | Admitting: Endocrinology

## 2013-03-07 ENCOUNTER — Ambulatory Visit (INDEPENDENT_AMBULATORY_CARE_PROVIDER_SITE_OTHER): Payer: BC Managed Care – PPO | Admitting: Endocrinology

## 2013-03-07 VITALS — BP 120/62 | HR 68 | Temp 98.4°F | Resp 12 | Ht 64.0 in | Wt 201.3 lb

## 2013-03-07 DIAGNOSIS — IMO0001 Reserved for inherently not codable concepts without codable children: Secondary | ICD-10-CM

## 2013-03-07 NOTE — Patient Instructions (Addendum)
Reduce pm dose to 16; may reduce am dose to 26 next week; more sugars after meals Am target 90-130  More exercise

## 2013-03-07 NOTE — Progress Notes (Signed)
Patient ID: Sherry White, female   DOB: [birth year], 52 y.o.   MRN: [id number]   Reason for Appointment : Followup for Type 2 Diabetes  History of Present Illness           Diagnosis: Type 2 diabetes mellitus, date of diagnosis: 12/2012         Past history: For 2 months prior to diagnosis the patient had been having increased thirst and urination along with some blurred vision Shewas seen by her primary care physician and glucose was 593 an A1c of 11.4 Her urine showed small ketones also Because of her high sugars she was started on Levemir insulin. This was subsequently changed to NovoLog 70/30 for simplicity and postprandial control and 8/14.  RECENT history:  INSULIN regimen:28 units acb 20 acs She has done very well with her premixed insulin and has not needed to change her dose further since her last visit Blood sugars are the lowest in the evenings with one episode of hypoglycemia after supper about 2 weeks ago Is trying to do a little exercise but can do better Also tolerating full dose of Kombiglyze XR which  was started at the same time  Glucose monitoring:  done about twice a day in the morning and afternoon       Glucometer:   One Touch Verio  Blood Glucose readings from record: Fasting recently 98-143, nonfasting 61-147 with overall median 113   Hypoglycemia symptoms:  feeling sweaty, shaky           Self-care: The diet that the patient has been following is: Recently trying to restrict carbohydrates and sweets  Meals: 3 meals per day.She has not had appointment for meal planning session as yet Exercise: Trying to walk for 15 minutes on some days                  Wt Readings from Last 3 Encounters:  03/07/13 201 lb 4.8 oz (91.309 kg)  01/24/13 206 lb 1.6 oz (93.486 kg)  01/09/13 204 lb 1.6 oz (92.579 kg)   Lab Results  Component Value Date   HGBA1C 11.4* 01/03/2013      Medication List       This list is accurate as of: 03/07/13  4:20 PM.  Always use your  most recent med list.               ALPRAZolam 0.25 MG tablet  Commonly known as:  XANAX  Take 1 tablet (0.25 mg total) by mouth 3 (three) times daily as needed for anxiety.     aspirin 81 MG EC tablet  Take 81 mg by mouth daily.     ezetimibe-simvastatin 10-40 MG per tablet  Commonly known as:  VYTORIN  Take 1 tablet by mouth at bedtime.     fenofibrate 160 MG tablet  TAKE 1 TABLET BY MOUTH ONCE A DAY     Fluticasone-Salmeterol 250-50 MCG/DOSE Aepb  Commonly known as:  ADVAIR DISKUS  Inhale 1 puff into the lungs 2 (two) times daily.     glucose blood test strip  Commonly known as:  ONETOUCH VERIO  Use as instructed to check blood sugar 3 times a day     Insulin Pen Needle 32G X 4 MM Misc  - Use to administer insulin once daily  - Dx 250.2 & 790.29     KOMBIGLYZE XR 2.09-998 MG Tb24  Generic drug:  Saxagliptin-Metformin     lisinopril 40 MG tablet  Commonly known as:  PRINIVIL,ZESTRIL  Take 1 tablet (40 mg total) by mouth daily.     NOVOLOG MIX 70/30 FLEXPEN (70-30) 100 UNIT/ML Supn  Generic drug:  Insulin Aspart Prot & Aspart  28 units in am and 2 units at dinner     ONETOUCH DELICA LANCETS FINE Misc  1 each by Does not apply route 3 (three) times daily.     sertraline 100 MG tablet  Commonly known as:  ZOLOFT  TAKE 2 TABLETS BY MOUTH EVERY DAY        Allergies: No Known Allergies  Past Medical History  Diagnosis Date  . Hypertension   . Anxiety   . Depression   . Asthma   . Hypertriglyceridemia   . Glucose intolerance (impaired glucose tolerance)   . Disc disease, degenerative, lumbar or lumbosacral   . MVP (mitral valve prolapse)   . ANXIETY 06/17/2007  . ASTHMA 06/17/2007  . CHEST PAIN 06/17/2007  . DEPRESSION 06/17/2007  . GOITER, MULTINODULAR 10/22/2008  . HYPERSOMNIA 12/06/2009  . HYPERTENSION 06/17/2007  . HYPERTRIGLYCERIDEMIA 06/17/2007  . Impaired glucose tolerance 11/15/2010  . Left lateral epicondylitis 11/15/2010  . NASH (nonalcoholic  steatohepatitis) 06/08/2011  . Right carpal tunnel syndrome 11/15/2010  . THYROID NODULE, RIGHT 10/19/2008  . Hyperlipidemia 07/13/2011    Past Surgical History  Procedure Laterality Date  . Cholecystectomy    . Biopsy breast  june 2010    [facility]    Family History  Problem Relation Age of Onset  . Hypertension Father   . Hypertension Mother   . Diabetes Mother   . Diabetes Father   . Coronary artery disease Father   . Alcohol abuse      uncle  . Tuberculosis      aunt  . Thyroid disease Sister     unknown type    Social History:  reports that she has never smoked. She does not have any smokeless tobacco history on file. She reports that she does not drink alcohol. Her drug history is not on file.    Review of Systems       Lipids: She has increase in triglycerides and has been prescribed Vytorin recently, also on fenofibrate     She has a history of  depression  treated with Zoloft    Physical Examination:  BP 120/62  Pulse 68  Temp(Src) 98.4 F (36.9 C)  Resp 12  Ht 5\' 4"  (1.626 m)  Wt 201 lb 4.8 oz (91.309 kg)  BMI 34.54 kg/m2  SpO2 97%  LMP 06/11/2012  No pedal edema       ASSESSMENT:  Diabetes type 2, uncontrolled - 250.02    Although she  has had symptomatic hyperglycemia at onset marked increase in her glucose along with an A1c of 11.4 she has had excellent control with twice a day premixed insulin Her blood sugars are low normal after supper including one episode of hypoglycemia but fairly good at other times Has been fairly compliant with glucose monitoring but not enough after supper recently Is trying to do a little exercise but can do better Also taking full dose of Kombiglyze XR  which may be helping her control  Complications: Hepatic steatosis  PLAN:    Discussed with patient sugar targets, when to do glucose monitoring and adjusting the insulin further if blood sugars are in the low range.  She will reduce  pm dose to 16; she can call if blood sugars are higher after supper with doing  this  Encouraged her to start exercising more regularly  Consider changing to Victoza plus Invokana if her insulin requirement starts going down significantly  Sherry White 03/07/2013, 4:20 PM

## 2013-03-10 ENCOUNTER — Other Ambulatory Visit: Payer: Self-pay | Admitting: Internal Medicine

## 2013-03-25 ENCOUNTER — Other Ambulatory Visit: Payer: Self-pay

## 2013-03-25 ENCOUNTER — Other Ambulatory Visit: Payer: Self-pay | Admitting: Internal Medicine

## 2013-03-25 MED ORDER — ALPRAZOLAM 0.25 MG PO TABS: 0.2500 mg | ORAL_TABLET | Freq: Three times a day (TID) | ORAL | Status: DC | PRN

## 2013-03-25 NOTE — Telephone Encounter (Signed)
Faxed hardcopy to [facility] 

## 2013-03-25 NOTE — Telephone Encounter (Signed)
Done hardcopy to robin  

## 2013-04-03 ENCOUNTER — Other Ambulatory Visit: Payer: Self-pay

## 2013-04-10 NOTE — Telephone Encounter (Signed)
Phone note completed ° °

## 2013-05-08 ENCOUNTER — Other Ambulatory Visit: Payer: Self-pay | Admitting: Internal Medicine

## 2013-05-09 NOTE — Telephone Encounter (Signed)
JJ pt 

## 2013-06-09 ENCOUNTER — Other Ambulatory Visit: Payer: BC Managed Care – PPO

## 2013-06-13 ENCOUNTER — Ambulatory Visit: Payer: BC Managed Care – PPO | Admitting: Endocrinology

## 2013-06-16 ENCOUNTER — Other Ambulatory Visit: Payer: BC Managed Care – PPO

## 2013-06-20 ENCOUNTER — Ambulatory Visit: Payer: BC Managed Care – PPO | Admitting: Endocrinology

## 2013-07-07 ENCOUNTER — Other Ambulatory Visit: Payer: BC Managed Care – PPO

## 2013-07-10 ENCOUNTER — Other Ambulatory Visit: Payer: Self-pay | Admitting: *Deleted

## 2013-07-10 ENCOUNTER — Other Ambulatory Visit (INDEPENDENT_AMBULATORY_CARE_PROVIDER_SITE_OTHER): Payer: BC Managed Care – PPO

## 2013-07-10 ENCOUNTER — Other Ambulatory Visit: Payer: Self-pay | Admitting: Endocrinology

## 2013-07-10 DIAGNOSIS — IMO0001 Reserved for inherently not codable concepts without codable children: Secondary | ICD-10-CM

## 2013-07-10 DIAGNOSIS — E1165 Type 2 diabetes mellitus with hyperglycemia: Principal | ICD-10-CM

## 2013-07-10 LAB — LIPID PANEL
Cholesterol: 217 mg/dL — ABNORMAL HIGH (ref 0–200)
HDL: 39.2 mg/dL (ref >39.00)
Total CHOL/HDL Ratio: 6
Triglycerides: 247 mg/dL — ABNORMAL HIGH (ref 0.0–149.0)
VLDL: 49.4 mg/dL — ABNORMAL HIGH (ref 0.0–40.0)

## 2013-07-10 LAB — LDL CHOLESTEROL, DIRECT: Direct LDL: 140.9 mg/dL

## 2013-07-10 LAB — HEMOGLOBIN A1C: Hgb A1c MFr Bld: 6.1 % (ref 4.6–6.5)

## 2013-08-07 ENCOUNTER — Ambulatory Visit: Payer: BC Managed Care – PPO | Admitting: Endocrinology

## 2013-08-11 ENCOUNTER — Other Ambulatory Visit: Payer: Self-pay | Admitting: *Deleted

## 2013-08-11 DIAGNOSIS — E781 Pure hyperglyceridemia: Secondary | ICD-10-CM

## 2013-08-11 MED ORDER — EZETIMIBE-SIMVASTATIN 10-40 MG PO TABS: 1.0000 | ORAL_TABLET | Freq: Every day | ORAL | Status: DC

## 2013-08-14 ENCOUNTER — Ambulatory Visit: Payer: BC Managed Care – PPO | Admitting: Endocrinology

## 2013-08-27 ENCOUNTER — Encounter: Payer: Self-pay | Admitting: Endocrinology

## 2013-08-27 ENCOUNTER — Ambulatory Visit (INDEPENDENT_AMBULATORY_CARE_PROVIDER_SITE_OTHER): Payer: 59 | Admitting: Endocrinology

## 2013-08-27 VITALS — BP 130/78 | HR 97 | Temp 98.3°F | Resp 16 | Ht 64.0 in | Wt 205.4 lb

## 2013-08-27 DIAGNOSIS — E785 Hyperlipidemia, unspecified: Secondary | ICD-10-CM

## 2013-08-27 DIAGNOSIS — IMO0001 Reserved for inherently not codable concepts without codable children: Secondary | ICD-10-CM

## 2013-08-27 DIAGNOSIS — I10 Essential (primary) hypertension: Secondary | ICD-10-CM

## 2013-08-27 DIAGNOSIS — E1165 Type 2 diabetes mellitus with hyperglycemia: Principal | ICD-10-CM

## 2013-08-27 NOTE — Patient Instructions (Addendum)
Exercise daily  Take insulin 2x daily  Please check blood sugars at least half the time about 2 hours after any meal and as directed on waking up. Please bring blood sugar monitor to each visit

## 2013-08-27 NOTE — Progress Notes (Signed)
Patient ID: Sherry White, female   DOB: [birth year], 53 y.o.   MRN: [id number]   Reason for Appointment : Followup for Type 2 Diabetes  History of Present Illness           Diagnosis: Type 2 diabetes mellitus, date of diagnosis: 12/2012         Past history: For 2 months prior to diagnosis the patient had been having increased thirst and urination along with some blurred vision Shewas seen by her primary care physician and glucose was 593 an A1c of 11.4 Her urine showed small ketones also Because of her high sugars she was started on Levemir insulin. This was subsequently changed to NovoLog 28/36 for simplicity and postprandial control and 8/14.  INSULIN regimen:26 units acb 16 acs  RECENT history:  Her A1c is now normal with continuing her premixed insulin dose. Because of relatively low readings after supper her evening dose was reduced to 16 units previously However she has not been seen in followup since 02/2013 More recently she has been very noncompliant with her glucose monitoring as well as evening insulin She is forgetting her evening dose about 3 times a week because of various other commitments and not remembering. Also taking full dose of Kombiglyze XR which she is tolerating Her weight has gone up a little also probably from inconsistent diet and exercise  Fasting readings: There are relatively higher especially when she forgets her evening dose Glucose monitoring:  done about twice a day in the morning and afternoon       Glucometer:   One Touch Verio  Blood Glucose readings from record:  PREMEAL Breakfast  late a.m.  Dinner  PCS  Overall  Glucose range:  108-164   93, 138   98, 103  ?    Mean/median:  135      131    Hypoglycemia: None recently, previously had symptoms of feeling sweaty, shaky           Self-care: The diet that the patient has been following is: Usually limiting carbohydrates and sweets  Meals: 3 meals per day.She has not had appointment for  meal planning session as yet Exercise: none                  Wt Readings from Last 3 Encounters:  08/27/13 205 lb 6.4 oz (93.169 kg)  03/07/13 201 lb 4.8 oz (91.309 kg)  01/24/13 206 lb 1.6 oz (93.486 kg)     Lab Results  Component Value Date   HGBA1C 6.1 07/10/2013   HGBA1C 11.4* 01/03/2013   HGBA1C 6.6* 06/08/2011   Lab Results  Component Value Date   LDLCALC  Value: 91        Total Cholesterol/HDL:CHD Risk Coronary Heart Disease Risk Table                     Men   Women  1/2 Average Risk   3.4   3.3  Average Risk       5.0   4.4  2 X Average Risk   9.6   7.1  3 X Average Risk  23.4   11.0        Use the calculated Patient Ratio above and the CHD Risk Table to determine the patient's CHD Risk.        ATP III CLASSIFICATION (LDL):  <100     mg/dL   Optimal  100-129  mg/dL   Near or Above  Optimal  130-159  mg/dL   Borderline  160-189  mg/dL   High  >190     mg/dL   Very High 06/05/2007   CREATININE 0.8 01/03/2013       Medication List       This list is accurate as of: 08/27/13  4:09 PM.  Always use your most recent med list.               ALPRAZolam 0.25 MG tablet  Commonly known as:  XANAX  Take 1 tablet (0.25 mg total) by mouth 3 (three) times daily as needed for anxiety.     aspirin 81 MG EC tablet  Take 81 mg by mouth daily.     ezetimibe-simvastatin 10-40 MG per tablet  Commonly known as:  VYTORIN  Take 1 tablet by mouth at bedtime.     fenofibrate 160 MG tablet  TAKE 1 TABLET BY MOUTH ONCE A DAY     Fluticasone-Salmeterol 250-50 MCG/DOSE Aepb  Commonly known as:  ADVAIR DISKUS  Inhale 1 puff into the lungs 2 (two) times daily.     glucose blood test strip  Commonly known as:  ONETOUCH VERIO  Use as instructed to check blood sugar 3 times a day     Insulin Pen Needle 32G X 4 MM Misc  - Use to administer insulin once daily  - Dx 250.2 & 790.29     KOMBIGLYZE XR 2.09-998 MG Tb24  Generic drug:  Saxagliptin-Metformin     lisinopril 40 MG  tablet  Commonly known as:  PRINIVIL,ZESTRIL  Take 1 tablet (40 mg total) by mouth daily.     NOVOLOG MIX 70/30 FLEXPEN (70-30) 100 UNIT/ML Pen  Generic drug:  Insulin Aspart Prot & Aspart  26 units in am and 16 units at dinner     ONETOUCH DELICA LANCETS FINE Misc  1 each by Does not apply route 3 (three) times daily.     sertraline 100 MG tablet  Commonly known as:  ZOLOFT  TAKE 2 TABLETS BY MOUTH EVERY DAY        Allergies: No Known Allergies  Past Medical History  Diagnosis Date  . Hypertension   . Anxiety   . Depression   . Asthma   . Hypertriglyceridemia   . Glucose intolerance (impaired glucose tolerance)   . Disc disease, degenerative, lumbar or lumbosacral   . MVP (mitral valve prolapse)   . ANXIETY 06/17/2007  . ASTHMA 06/17/2007  . CHEST PAIN 06/17/2007  . DEPRESSION 06/17/2007  . GOITER, MULTINODULAR 10/22/2008  . HYPERSOMNIA 12/06/2009  . HYPERTENSION 06/17/2007  . HYPERTRIGLYCERIDEMIA 06/17/2007  . Impaired glucose tolerance 11/15/2010  . Left lateral epicondylitis 11/15/2010  . NASH (nonalcoholic steatohepatitis) 06/08/2011  . Right carpal tunnel syndrome 11/15/2010  . THYROID NODULE, RIGHT 10/19/2008  . Hyperlipidemia 07/13/2011    Past Surgical History  Procedure Laterality Date  . Cholecystectomy    . Biopsy breast  june 2010    [facility]    Family History  Problem Relation Age of Onset  . Hypertension Father   . Hypertension Mother   . Diabetes Mother   . Diabetes Father   . Coronary artery disease Father   . Alcohol abuse      uncle  . Tuberculosis      aunt  . Thyroid disease Sister     unknown type    Social History:  reports that she has never smoked. She does not have any smokeless  tobacco history on file. She reports that she does not drink alcohol. Her drug history is not on file.    Review of Systems       Lipids: She has increase in triglycerides and has been prescribed Vytorin by PCP, also on  fenofibrate. However lipids are not controlled. She thinks she is usually watching her diet  Lab Results  Component Value Date   CHOL 217* 07/10/2013   HDL 39.20 07/10/2013   LDLCALC  Value: 91        Total Cholesterol/HDL:CHD Risk Coronary Heart Disease Risk Table                     Men   Women  1/2 Average Risk   3.4   3.3  Average Risk       5.0   4.4  2 X Average Risk   9.6   7.1  3 X Average Risk  23.4   11.0        Use the calculated Patient Ratio above and the CHD Risk Table to determine the patient's CHD Risk.        ATP III CLASSIFICATION (LDL):  <100     mg/dL   Optimal  100-129  mg/dL   Near or Above                    Optimal  130-159  mg/dL   Borderline  160-189  mg/dL   High  >190     mg/dL   Very High 06/05/2007   LDLDIRECT 140.9 07/10/2013   TRIG 247.0* 07/10/2013   CHOLHDL 6 07/10/2013       She has a history of  depression  treated with Zoloft    Physical Examination:  BP 130/78  Pulse 97  Temp(Src) 98.3 F (36.8 C)  Resp 16  Ht 5\' 4"  (1.626 m)  Wt 205 lb 6.4 oz (93.169 kg)  BMI 35.24 kg/m2  SpO2 96%  LMP 06/11/2012     ASSESSMENT:  Diabetes type 2, uncontrolled   Her A1c indicates overall excellent control over the last 3 months However she does have relatively higher readings in the mornings She is having difficulty with complying with her insulin regimen and glucose monitoring in the evening She thinks she can do better with compliance with the help of her daughter to take her insulin and check her sugar in the evening Is not able to comply with  exercise and can do better Still taking maximum dose Kombiglyze XR  which may be helping her control  Complications: Hepatic steatosis  HYPERLIPIDEMIA: She is taking both Vytorin and fenofibrate but LDL is still relatively high  PLAN:  Have have discussed with her the possibility of using the V-go pump and discussed how this works, given Programmer, systems. She will call if she wants to schedule appointment with nurse  educator for training Better compliance with glucose monitoring including after supper Regular exercise, at least with walking Consistent diet moderation of carbohydrates and fats Consider adding WelChol for lipids. She wants to wait till the next visit  Counseling time over 50% of today's 25 minute visit  Followup in 3 months  Genice Kimberlin 08/27/2013, 4:09 PM

## 2013-09-10 ENCOUNTER — Other Ambulatory Visit: Payer: Self-pay

## 2013-09-10 DIAGNOSIS — I10 Essential (primary) hypertension: Secondary | ICD-10-CM

## 2013-09-10 MED ORDER — SERTRALINE HCL 100 MG PO TABS: 100.0000 mg | ORAL_TABLET | Freq: Two times a day (BID) | ORAL | Status: DC

## 2013-09-10 MED ORDER — LISINOPRIL 40 MG PO TABS: 40.0000 mg | ORAL_TABLET | Freq: Every day | ORAL | Status: DC

## 2013-09-10 MED ORDER — ALPRAZOLAM 0.25 MG PO TABS: 0.2500 mg | ORAL_TABLET | Freq: Three times a day (TID) | ORAL | Status: DC | PRN

## 2013-09-10 MED ORDER — FENOFIBRATE 160 MG PO TABS: 160.0000 mg | ORAL_TABLET | Freq: Every day | ORAL | Status: DC

## 2013-09-10 NOTE — Telephone Encounter (Signed)
Faxed hardcopy for Alprazolam to [facility]  #90 with 2 RF's

## 2013-09-10 NOTE — Telephone Encounter (Signed)
Done hardcopy to robin  

## 2013-09-12 ENCOUNTER — Telehealth: Payer: Self-pay | Admitting: Endocrinology

## 2013-09-12 ENCOUNTER — Other Ambulatory Visit: Payer: Self-pay | Admitting: *Deleted

## 2013-09-12 MED ORDER — INSULIN ASPART PROT & ASPART (70-30 MIX) 100 UNIT/ML PEN: PEN_INJECTOR | SUBCUTANEOUS | Status: DC

## 2013-09-12 NOTE — Telephone Encounter (Signed)
rx sent

## 2013-09-12 NOTE — Telephone Encounter (Signed)
Please call the insulin in for the pt by today she will be out. The pharmacist apologizes they were sending it to the wrong office.

## 2013-11-12 ENCOUNTER — Other Ambulatory Visit: Payer: Self-pay | Admitting: Internal Medicine

## 2013-11-20 ENCOUNTER — Other Ambulatory Visit: Payer: 59

## 2013-11-26 ENCOUNTER — Ambulatory Visit: Payer: 59 | Admitting: Endocrinology

## 2013-12-05 ENCOUNTER — Telehealth: Payer: Self-pay

## 2013-12-05 NOTE — Telephone Encounter (Signed)
Labs are ordered and need to be completed.

## 2014-01-12 ENCOUNTER — Other Ambulatory Visit: Payer: Self-pay | Admitting: Internal Medicine

## 2014-01-12 ENCOUNTER — Other Ambulatory Visit: Payer: Self-pay | Admitting: Endocrinology

## 2014-01-13 NOTE — Telephone Encounter (Signed)
Looks like pt is overdue for an appt and labs--please advise if okay to refill

## 2014-01-13 NOTE — Telephone Encounter (Signed)
Refill done  Pt sees Dr Dwyane Dee for DM  OK to ask pt to make ROV for other problems

## 2014-01-19 ENCOUNTER — Other Ambulatory Visit: Payer: Self-pay | Admitting: Internal Medicine

## 2014-02-02 ENCOUNTER — Other Ambulatory Visit: Payer: Self-pay | Admitting: Internal Medicine

## 2014-02-05 ENCOUNTER — Other Ambulatory Visit: Payer: Self-pay

## 2014-02-05 MED ORDER — INSULIN PEN NEEDLE 32G X 4 MM MISC: Status: DC

## 2014-02-08 ENCOUNTER — Other Ambulatory Visit: Payer: Self-pay | Admitting: Endocrinology

## 2014-04-03 ENCOUNTER — Other Ambulatory Visit: Payer: Self-pay | Admitting: Internal Medicine

## 2014-04-03 MED ORDER — SERTRALINE HCL 100 MG PO TABS: 100.0000 mg | ORAL_TABLET | Freq: Two times a day (BID) | ORAL | Status: DC

## 2014-04-08 ENCOUNTER — Ambulatory Visit: Payer: 59 | Admitting: Internal Medicine

## 2014-04-16 ENCOUNTER — Ambulatory Visit: Payer: 59 | Admitting: Internal Medicine

## 2014-04-28 ENCOUNTER — Ambulatory Visit (INDEPENDENT_AMBULATORY_CARE_PROVIDER_SITE_OTHER): Payer: 59 | Admitting: Internal Medicine

## 2014-04-28 ENCOUNTER — Encounter: Payer: Self-pay | Admitting: Internal Medicine

## 2014-04-28 VITALS — BP 132/88 | HR 95 | Temp 98.1°F | Ht 64.0 in | Wt 210.0 lb

## 2014-04-28 DIAGNOSIS — J018 Other acute sinusitis: Secondary | ICD-10-CM

## 2014-04-28 DIAGNOSIS — Z0000 Encounter for general adult medical examination without abnormal findings: Secondary | ICD-10-CM

## 2014-04-28 DIAGNOSIS — G4710 Hypersomnia, unspecified: Secondary | ICD-10-CM

## 2014-04-28 DIAGNOSIS — E781 Pure hyperglyceridemia: Secondary | ICD-10-CM

## 2014-04-28 DIAGNOSIS — E119 Type 2 diabetes mellitus without complications: Secondary | ICD-10-CM

## 2014-04-28 DIAGNOSIS — J019 Acute sinusitis, unspecified: Secondary | ICD-10-CM | POA: Insufficient documentation

## 2014-04-28 DIAGNOSIS — J45901 Unspecified asthma with (acute) exacerbation: Secondary | ICD-10-CM

## 2014-04-28 DIAGNOSIS — M79644 Pain in right finger(s): Secondary | ICD-10-CM | POA: Insufficient documentation

## 2014-04-28 DIAGNOSIS — M79645 Pain in left finger(s): Secondary | ICD-10-CM

## 2014-04-28 MED ORDER — AZITHROMYCIN 250 MG PO TABS: ORAL_TABLET | ORAL | Status: DC

## 2014-04-28 MED ORDER — DICLOFENAC SODIUM 1 % TD GEL: 4.0000 g | Freq: Four times a day (QID) | TRANSDERMAL | Status: DC

## 2014-04-28 MED ORDER — EZETIMIBE-SIMVASTATIN 10-40 MG PO TABS: 1.0000 | ORAL_TABLET | Freq: Every day | ORAL | Status: DC

## 2014-04-28 MED ORDER — ALPRAZOLAM 0.25 MG PO TABS: 0.2500 mg | ORAL_TABLET | Freq: Three times a day (TID) | ORAL | Status: DC | PRN

## 2014-04-28 MED ORDER — FENOFIBRATE 160 MG PO TABS: 160.0000 mg | ORAL_TABLET | Freq: Every day | ORAL | Status: DC

## 2014-04-28 MED ORDER — FLUTICASONE-SALMETEROL 250-50 MCG/DOSE IN AEPB: 1.0000 | INHALATION_SPRAY | Freq: Two times a day (BID) | RESPIRATORY_TRACT | Status: DC

## 2014-04-28 NOTE — Progress Notes (Signed)
Pre visit review using our clinic review tool, if applicable. No additional management support is needed unless otherwise documented below in the visit note. 

## 2014-04-28 NOTE — Progress Notes (Signed)
Subjective:    Patient ID: Sherry White, female    DOB: 01/13/1961, 53 y.o.   MRN: [id number]  HPI  Here for wellness and f/u;  Overall doing ok;  Pt denies CP, worsening SOB, DOE, wheezing, orthopnea, PND, worsening LE edema, palpitations, dizziness or syncope.  Pt denies neurological change such as new headache, facial or extremity weakness.  Pt denies polydipsia, polyuria, or low sugar symptoms. Pt states overall good compliance with treatment and medications, good tolerability, and has been trying to follow lower cholesterol diet.  Pt denies worsening depressive symptoms, suicidal ideation or panic. No fever, night sweats, wt loss, loss of appetite, or other constitutional symptoms.  Pt states good ability with ADL's, has low fall risk, home safety reviewed and adequate, no other significant changes in hearing or vision, and only occasionally active with exercise. Pt to see GYn soon with mammogram. Declines colonoscopy.  Incidentally with  2-3 days acute onset fever, facial pain, pressure, headache, general weakness and malaise, and greenish d/c, with mild ST and cough, but pt denies chest pain, wheezing, increased sob or doe,syncope. Does c/o ongoing fatigue, and does have signficant daytime hypersomnolence.  Past Medical History  Diagnosis Date  . Hypertension   . Anxiety   . Depression   . Asthma   . Hypertriglyceridemia   . Glucose intolerance (impaired glucose tolerance)   . Disc disease, degenerative, lumbar or lumbosacral   . MVP (mitral valve prolapse)   . ANXIETY 06/17/2007  . ASTHMA 06/17/2007  . CHEST PAIN 06/17/2007  . DEPRESSION 06/17/2007  . GOITER, MULTINODULAR 10/22/2008  . HYPERSOMNIA 12/06/2009  . HYPERTENSION 06/17/2007  . HYPERTRIGLYCERIDEMIA 06/17/2007  . Impaired glucose tolerance 11/15/2010  . Left lateral epicondylitis 11/15/2010  . NASH (nonalcoholic steatohepatitis) 06/08/2011  . Right carpal tunnel syndrome 11/15/2010  . THYROID NODULE, RIGHT 10/19/2008  .  Hyperlipidemia 07/13/2011   Past Surgical History  Procedure Laterality Date  . Cholecystectomy    . Biopsy breast  june 2010    [facility]    reports that she has never smoked. She does not have any smokeless tobacco history on file. She reports that she does not drink alcohol. Her drug history is not on file. family history includes Alcohol abuse in an other family member; Coronary artery disease in her father; Diabetes in her father and mother; Hypertension in her father and mother; Thyroid disease in her sister; Tuberculosis in an other family member. No Known Allergies Current Outpatient Prescriptions on File Prior to Visit  Medication Sig Dispense Refill  . aspirin 81 MG EC tablet Take 81 mg by mouth daily.      . Insulin Aspart Prot & Aspart (NOVOLOG MIX 70/30 FLEXPEN) (70-30) 100 UNIT/ML Pen 26 units in am and 16 units at dinner 15 mL 5  . Insulin Pen Needle (BD PEN NEEDLE NANO U/F) 32G X 4 MM MISC Use to administer insulin 100 each 0  . KOMBIGLYZE XR 2.09-998 MG TB24     . lisinopril (PRINIVIL,ZESTRIL) 40 MG tablet Take 1 tablet by mouth  daily 90 tablet 0  . ONETOUCH DELICA LANCETS 32K MISC USE 1 NEW LANCET 3 TIMES DAILY 100 each 0  . ONETOUCH VERIO test strip USE AS DIRECTED TO TEST BLOOD SUGAR 3 TIMES A DAY 100 each 0  . sertraline (ZOLOFT) 100 MG tablet Take 1 tablet (100 mg total) by mouth 2 (two) times daily. 180 tablet 0   No current facility-administered medications on file prior  to visit.   Review of Systems Constitutional: Negative for increased diaphoresis, other activity, appetite or other siginficant weight change  HENT: Negative for worsening hearing loss, ear pain, facial swelling, mouth sores and neck stiffness.   Eyes: Negative for other worsening pain, redness or visual disturbance.  Respiratory: Negative for shortness of breath and wheezing.   Cardiovascular: Negative for chest pain and palpitations.  Gastrointestinal: Negative for  diarrhea, blood in stool, abdominal distention or other pain Genitourinary: Negative for hematuria, flank pain or change in urine volume.  Musculoskeletal: Negative for myalgias or other joint complaints.  Skin: Negative for color change and wound.  Neurological: Negative for syncope and numbness. other than noted Hematological: Negative for adenopathy. or other swelling Psychiatric/Behavioral: Negative for hallucinations, self-injury, decreased concentration or other worsening agitation.      Objective:   Physical Exam BP 132/88 mmHg  Pulse 95  Temp(Src) 98.1 F (36.7 C) (Oral)  Ht 5\' 4"  (1.626 m)  Wt 210 lb (95.255 kg)  BMI 36.03 kg/m2  SpO2 96%  LMP 06/11/2012 VS noted, mild ill Constitutional: Pt is oriented to person, place, and time. Appears well-developed and well-nourished.  Head: Normocephalic and atraumatic.  Right Ear: External ear normal.  Left Ear: External ear normal.  Nose: Nose normal.  Mouth/Throat: Oropharynx is clear and moist.  Eyes: Conjunctivae and EOM are normal. Pupils are equal, round, and reactive to light.  Neck: Normal range of motion. Neck supple. No JVD present. No tracheal deviation present.  Bilat tm's with mild erythema.  Max sinus areas mild tender.  Pharynx with mild erythema, no exudate Cardiovascular: Normal rate, regular rhythm, normal heart sounds and intact distal pulses.   Pulmonary/Chest: Effort normal and breath sounds without rales or wheezing  Abdominal: Soft. Bowel sounds are normal. NT. No HSM  Musculoskeletal: Normal range of motion. Exhibits no edema.  Lymphadenopathy:  Has no cervical adenopathy.  Neurological: Pt is alert and oriented to person, place, and time. Pt has normal reflexes. No cranial nerve deficit. Motor grossly intact Skin: Skin is warm and dry. No rash noted.  Psychiatric:  Has normal mood and affect. Behavior is normal.  Bilat thumb with mod bony deg changes, no effusion or soft tissue swelling    Assessment &  Plan:

## 2014-04-28 NOTE — Patient Instructions (Addendum)
Please take all new medication as prescribed - the antibiotic and the gel for the pain  Please continue all other medications as before, and refills have been done if requested - the xanax  Please have the pharmacy call with any other refills you may need.  Please continue your efforts at being more active, low cholesterol diet, and weight control.  You are otherwise up to date with prevention measures today.  You will be contacted regarding the referral for: pulmonary  Please keep your appointments with your specialists as you may have planned  Please go to the LAB in the Basement (turn left off the elevator) for the tests to be done tomorrow or asap  You will be contacted by phone if any changes need to be made immediately.  Otherwise, you will receive a letter about your results with an explanation, but please check with MyChart first.  Please remember to sign up for MyChart if you have not done so, as this will be important to you in the future with finding out test results, communicating by private email, and scheduling acute appointments online when needed.  Please return in 6 months, or sooner if needed

## 2014-05-02 NOTE — Assessment & Plan Note (Signed)
Mild to mod, for antibx course,  to f/u any worsening symptoms or concerns 

## 2014-05-02 NOTE — Assessment & Plan Note (Signed)

## 2014-05-02 NOTE — Assessment & Plan Note (Signed)
Mild to mod, for voltaren gel prn asd,  to f/u any worsening symptoms or concerns

## 2014-05-02 NOTE — Assessment & Plan Note (Signed)
stable overall by history and exam, recent data reviewed with pt, and pt to continue medical treatment as before,  to f/u any worsening symptoms or concerns Lab Results  Component Value Date   HGBA1C 6.1 07/10/2013

## 2014-05-02 NOTE — Assessment & Plan Note (Signed)
?   [facility] - for pulm referral, may need sleep study

## 2014-05-13 ENCOUNTER — Encounter: Payer: Self-pay | Admitting: Internal Medicine

## 2014-06-06 ENCOUNTER — Other Ambulatory Visit: Payer: Self-pay | Admitting: Internal Medicine

## 2014-06-08 ENCOUNTER — Other Ambulatory Visit: Payer: Self-pay | Admitting: Endocrinology

## 2014-06-08 ENCOUNTER — Other Ambulatory Visit: Payer: Self-pay | Admitting: Internal Medicine

## 2014-06-09 ENCOUNTER — Other Ambulatory Visit: Payer: Self-pay | Admitting: Internal Medicine

## 2014-06-12 ENCOUNTER — Telehealth: Payer: Self-pay | Admitting: *Deleted

## 2014-06-12 MED ORDER — INSULIN PEN NEEDLE 32G X 4 MM MISC: Status: DC

## 2014-06-12 MED ORDER — GLUCOSE BLOOD VI STRP: ORAL_STRIP | Status: DC

## 2014-06-12 MED ORDER — ONETOUCH DELICA LANCETS 33G MISC: Status: DC

## 2014-06-12 NOTE — Telephone Encounter (Signed)
Patient requested a refill of her testing supplies, these were denied, patient has not been seen since April and will need an appointment to get refills

## 2014-06-12 NOTE — Addendum Note (Signed)
Addended by: Sharon Seller B on: 06/12/2014 01:23 PM   Modules accepted: Orders

## 2014-06-12 NOTE — Telephone Encounter (Signed)
Ok for supplies per [employer] for OV next available per pt convenience

## 2014-06-25 ENCOUNTER — Other Ambulatory Visit: Payer: Self-pay

## 2014-06-25 LAB — HM DIABETES EYE EXAM

## 2014-06-25 NOTE — Telephone Encounter (Signed)
Pharmacy informed.

## 2014-06-25 NOTE — Telephone Encounter (Signed)
xanax too soon  Had 3 mo total rx done Apr 28, 2014

## 2014-06-26 ENCOUNTER — Encounter: Payer: Self-pay | Admitting: Internal Medicine

## 2014-06-26 LAB — HM DIABETES EYE EXAM

## 2014-06-30 ENCOUNTER — Other Ambulatory Visit: Payer: Self-pay | Admitting: Internal Medicine

## 2014-07-01 ENCOUNTER — Encounter: Payer: Self-pay | Admitting: Pulmonary Disease

## 2014-07-01 ENCOUNTER — Ambulatory Visit (INDEPENDENT_AMBULATORY_CARE_PROVIDER_SITE_OTHER): Payer: 59 | Admitting: Pulmonary Disease

## 2014-07-01 VITALS — BP 132/78 | HR 86 | Temp 97.2°F | Ht 64.0 in | Wt 213.8 lb

## 2014-07-01 DIAGNOSIS — G4733 Obstructive sleep apnea (adult) (pediatric): Secondary | ICD-10-CM

## 2014-07-01 NOTE — Assessment & Plan Note (Signed)
The patient's history is very suggestive for clinically significant sleep disordered breathing. I have had a long discussion with her about sleep apnea, including its impact to her quality of life and cardiovascular health. At this point, she needs a sleep study for diagnosis, and I think she is an excellent candidate for home sleep testing. The patient is agreeable to this approach.

## 2014-07-01 NOTE — Patient Instructions (Signed)
Will schedule for home sleep testing, and will arrange for followup once the results are available. Work on weight reduction.

## 2014-07-01 NOTE — Progress Notes (Signed)
   Subjective:    Patient ID: Sherry White, female    DOB: Dec 16, 1960, 54 y.o.   MRN: [id number]  HPI The patient is a 54 year old female who I've been asked to see for possible obstructive sleep apnea. The patient has been noted to have loud snoring, and also describes classic gasping arousals. She has frequent awakenings during the night, and is not rested in the mornings upon arising. She notes definite sleep pressure during the day with inactivity, and will fall asleep easily in the evenings watching television or movies. She also has sleep pressure driving longer distances. The patient states that her weight is fairly stable over the last 2 years, and her Epworth score today is 15.   Sleep Questionnaire What time do you typically go to bed?( Between what hours) 9-10p 9-10p at 1543 on 07/01/14 by Inge Rise, CMA How long does it take you to fall asleep? 32min-3hrs 36min-3hrs at 1543 on 07/01/14 by Inge Rise, CMA How many times during the night do you wake up? 3 3 at 1543 on 07/01/14 by Inge Rise, CMA What time do you get out of bed to start your day? 0530 0530 at 1543 on 07/01/14 by Inge Rise, CMA Do you drive or operate heavy machinery in your occupation? No No at 1543 on 07/01/14 by Inge Rise, CMA How much has your weight changed (up or down) over the past two years? (In pounds) 5 lb (2.268 kg) 5 lb (2.268 kg) at 1543 on 07/01/14 by Inge Rise, CMA Have you ever had a sleep study before? No No at 1543 on 07/01/14 by Inge Rise, CMA Do you currently use CPAP? No No at 1543 on 07/01/14 by Inge Rise, CMA Do you wear oxygen at any time? No No at 1543 on 07/01/14 by Inge Rise, CMA   Review of Systems  Constitutional: Negative for fever and unexpected weight change.  HENT: Positive for congestion. Negative for dental problem, ear pain, nosebleeds, postnasal drip, rhinorrhea, sinus pressure, sneezing, sore throat and trouble swallowing.     Eyes: Negative for redness and itching.  Respiratory: Positive for shortness of breath. Negative for cough, chest tightness and wheezing.   Cardiovascular: Positive for leg swelling. Negative for palpitations.  Gastrointestinal: Negative for nausea and vomiting.  Genitourinary: Negative for dysuria.  Musculoskeletal: Negative for joint swelling.  Skin: Negative for rash.  Neurological: Negative for headaches.  Hematological: Does not bruise/bleed easily.  Psychiatric/Behavioral: Positive for dysphoric mood. The patient is nervous/anxious.        Objective:   Physical Exam Constitutional:  Obese female, no acute distress  HENT:  Nares patent without discharge  Oropharynx without exudate, palate and uvula are thick and elongated.   Eyes:  Perrla, eomi, no scleral icterus  Neck:  No JVD, no TMG  Cardiovascular:  Normal rate, regular rhythm, no rubs or gallops.  1/6 sem        Intact distal pulses  Pulmonary :  Normal breath sounds, no stridor or respiratory distress   No rales, rhonchi, or wheezing  Abdominal:  Soft, nondistended, bowel sounds present.  No tenderness noted.   Musculoskeletal:  No lower extremity edema noted.  Lymph Nodes:  No cervical lymphadenopathy noted  Skin:  No cyanosis noted  Neurologic:  Alert, appropriate, moves all 4 extremities without obvious deficit.         Assessment & Plan:

## 2014-07-08 ENCOUNTER — Telehealth: Payer: Self-pay | Admitting: Internal Medicine

## 2014-07-08 MED ORDER — ALPRAZOLAM 0.25 MG PO TABS: 0.2500 mg | ORAL_TABLET | Freq: Three times a day (TID) | ORAL | Status: DC | PRN

## 2014-07-08 NOTE — Telephone Encounter (Signed)
Done hardcopy to cindy  

## 2014-07-08 NOTE — Telephone Encounter (Signed)
Requesting refill on alprazolam.  States lost last script

## 2014-07-09 NOTE — Telephone Encounter (Signed)
rx faxed to [facility].  

## 2014-07-21 ENCOUNTER — Other Ambulatory Visit: Payer: Self-pay | Admitting: Endocrinology

## 2014-07-21 ENCOUNTER — Other Ambulatory Visit: Payer: Self-pay | Admitting: Internal Medicine

## 2014-07-22 NOTE — Telephone Encounter (Signed)
Pt last seen 08/27/2013.

## 2014-07-30 ENCOUNTER — Telehealth: Payer: Self-pay | Admitting: Internal Medicine

## 2014-07-30 DIAGNOSIS — Z0000 Encounter for general adult medical examination without abnormal findings: Secondary | ICD-10-CM

## 2014-07-30 NOTE — Telephone Encounter (Signed)
Pt request referral for colonoscopy just for routine check up. Please help

## 2014-07-30 NOTE — Telephone Encounter (Signed)
This has been done.

## 2014-07-31 NOTE — Telephone Encounter (Signed)
Called patient and let her know that referral has been ordered.

## 2014-08-07 DIAGNOSIS — G4733 Obstructive sleep apnea (adult) (pediatric): Secondary | ICD-10-CM

## 2014-08-13 ENCOUNTER — Telehealth: Payer: Self-pay | Admitting: Pulmonary Disease

## 2014-08-13 ENCOUNTER — Other Ambulatory Visit: Payer: Self-pay | Admitting: *Deleted

## 2014-08-13 ENCOUNTER — Encounter: Payer: Self-pay | Admitting: Pulmonary Disease

## 2014-08-13 DIAGNOSIS — G4733 Obstructive sleep apnea (adult) (pediatric): Secondary | ICD-10-CM

## 2014-08-13 NOTE — Telephone Encounter (Signed)
Pt needs ov to review recent sleep study results.

## 2014-08-13 NOTE — Telephone Encounter (Signed)
Pt returned call made her an appointment for 4/13 @2 :45.Sherry White

## 2014-08-13 NOTE — Telephone Encounter (Signed)
lmomtcb x1 for pt 

## 2014-08-17 ENCOUNTER — Encounter: Payer: Self-pay | Admitting: Internal Medicine

## 2014-08-26 ENCOUNTER — Encounter: Payer: Self-pay | Admitting: Endocrinology

## 2014-08-26 ENCOUNTER — Ambulatory Visit (INDEPENDENT_AMBULATORY_CARE_PROVIDER_SITE_OTHER): Payer: 59 | Admitting: Endocrinology

## 2014-08-26 VITALS — BP 140/98 | HR 98 | Temp 97.8°F | Ht 64.0 in | Wt 212.0 lb

## 2014-08-26 DIAGNOSIS — IMO0002 Reserved for concepts with insufficient information to code with codable children: Secondary | ICD-10-CM

## 2014-08-26 DIAGNOSIS — E785 Hyperlipidemia, unspecified: Secondary | ICD-10-CM

## 2014-08-26 DIAGNOSIS — E042 Nontoxic multinodular goiter: Secondary | ICD-10-CM | POA: Diagnosis not present

## 2014-08-26 DIAGNOSIS — E1165 Type 2 diabetes mellitus with hyperglycemia: Secondary | ICD-10-CM

## 2014-08-26 NOTE — Patient Instructions (Signed)
Please check blood sugars at least half the time about 2 hours after any meal and 3 times per week on waking up. Please bring blood sugar monitor to each visit. Recommended blood sugar levels about 2 hours after meal is 140-180 and on waking up 90-130  Exercise daily  Will start V-go 20 pump if agreeable

## 2014-08-26 NOTE — Progress Notes (Signed)
Patient ID: Sherry White, female   DOB: 06-29-1960, 54 y.o.   MRN: [id number]   Reason for Appointment : Followup for Type 2 Diabetes  History of Present Illness           Diagnosis: Type 2 diabetes mellitus, date of diagnosis: 12/2012         Past history: For 2 months prior to diagnosis the patient had been having increased thirst and urination along with some blurred vision Shewas seen by her primary care physician and glucose was 593 an A1c of 11.4 Her urine showed small ketones also Because of her high sugars she was started on Levemir insulin. This was subsequently changed to NovoLog 78/29 for simplicity and postprandial control and 8/14.  INSULIN regimen: NovoLog mix insulin: 26 units acb 16 acs  RECENT history:    She has not been seen in follow-up for almost a year now Although she was doing fairly well with premixed insulin twice a day and Kombiglyze XR previously she was still having some difficulty forgetting her evening insulin dose about 3 times a week Current blood sugar patterns and problems:  More recently she is forgetting to do her insulin in the evening fairly regularly  As a result her fasting readings are fairly consistently high and averaging about 162  She is not checking her sugars after meals and has only a couple of readings including one after lunch today which was fairly good.  She has not had an A1c checked in over a year  She is tending to gain a little weight  Still not motivated to exercise despite previous consistent reminders  Fasting readings: There are relatively higher especially when she forgets her evening dose Glucose monitoring:  done about twice a day in the morning and afternoon       Glucometer:   One Touch Verio  Blood Glucose readings from record:  PRE-MEAL Breakfast Lunch Dinner Bedtime Overall  Glucose range:  130-192    105, 158     Average:  160      157     Hypoglycemia: None recently, previously with low sugars had  symptoms of feeling sweaty, shaky           Self-care: DIET: Inconsistent.  She is going to start [place] soon   Meals: 3 meals per day.She has not had appointment for meal planning session as yet Exercise: none, not motivated                   Wt Readings from Last 3 Encounters:  08/26/14 212 lb (96.163 kg)  07/01/14 213 lb 12.8 oz (96.979 kg)  04/28/14 210 lb (95.255 kg)     Lab Results  Component Value Date   HGBA1C 6.1 07/10/2013   HGBA1C 11.4* 01/03/2013   HGBA1C 6.6* 06/08/2011   Lab Results  Component Value Date   LDLCALC  06/05/2007    91        Total Cholesterol/HDL:CHD Risk Coronary Heart Disease Risk Table                     Men   Women  1/2 Average Risk   3.4   3.3  Average Risk       5.0   4.4  2 X Average Risk   9.6   7.1  3 X Average Risk  23.4   11.0        Use the calculated Patient Ratio above and the  CHD Risk Table to determine the patient's CHD Risk.        ATP III CLASSIFICATION (LDL):  <100     mg/dL   Optimal  100-129  mg/dL   Near or Above                    Optimal  130-159  mg/dL   Borderline  160-189  mg/dL   High  >190     mg/dL   Very High   CREATININE 0.8 01/03/2013       Medication List       This list is accurate as of: 08/26/14  4:22 PM.  Always use your most recent med list.               ALPRAZolam 0.25 MG tablet  Commonly known as:  XANAX  Take 1 tablet (0.25 mg total) by mouth 3 (three) times daily as needed for anxiety.     aspirin 81 MG EC tablet  Take 81 mg by mouth daily.     diclofenac sodium 1 % Gel  Commonly known as:  VOLTAREN  Apply 4 g topically 4 (four) times daily. As needed for pain     ezetimibe-simvastatin 10-40 MG per tablet  Commonly known as:  VYTORIN  Take 1 tablet by mouth at bedtime.     fenofibrate 160 MG tablet  Take 1 tablet by mouth  daily     Fluticasone-Salmeterol 250-50 MCG/DOSE Aepb  Commonly known as:  ADVAIR DISKUS  Inhale 1 puff into the lungs 2 (two) times daily.      glucose blood test strip  Commonly known as:  ONETOUCH VERIO  - 1 each by Other route 2 (two) times daily. Use to check blood sugars twice a day  - Dx E11.9     insulin aspart protamine - aspart (70-30) 100 UNIT/ML FlexPen  Commonly known as:  NOVOLOG MIX 70/30 FLEXPEN  26 units in am and 16 units at dinner     Insulin Pen Needle 32G X 4 MM Misc  Commonly known as:  BD PEN NEEDLE NANO U/F  Use as directed with insulin Diagnosis code E11.9     KOMBIGLYZE XR 2.09-998 MG Tb24  Generic drug:  Saxagliptin-Metformin     lisinopril 40 MG tablet  Commonly known as:  PRINIVIL,ZESTRIL  Take 1 tablet by mouth  daily     ONETOUCH DELICA LANCETS 16W Misc  - Use to check blood sugars twice a day  - Dx E11.9     sertraline 100 MG tablet  Commonly known as:  ZOLOFT  Take 1 tablet by mouth two  times daily        Allergies: No Known Allergies  Past Medical History  Diagnosis Date  . Hypertension   . Anxiety   . Depression   . Asthma   . Hypertriglyceridemia   . Glucose intolerance (impaired glucose tolerance)   . Disc disease, degenerative, lumbar or lumbosacral   . MVP (mitral valve prolapse)   . ANXIETY 06/17/2007  . ASTHMA 06/17/2007  . CHEST PAIN 06/17/2007  . DEPRESSION 06/17/2007  . GOITER, MULTINODULAR 10/22/2008  . HYPERSOMNIA 12/06/2009  . HYPERTENSION 06/17/2007  . HYPERTRIGLYCERIDEMIA 06/17/2007  . Impaired glucose tolerance 11/15/2010  . Left lateral epicondylitis 11/15/2010  . NASH (nonalcoholic steatohepatitis) 06/08/2011  . Right carpal tunnel syndrome 11/15/2010  . THYROID NODULE, RIGHT 10/19/2008  . Hyperlipidemia 07/13/2011    Past Surgical History  Procedure Laterality Date  . Cholecystectomy    .  Biopsy breast  june 2010    neg./ [facility]    Family History  Problem Relation Age of Onset  . Hypertension Father   . Hypertension Mother   . Diabetes Mother   . Diabetes Father   . Coronary artery disease Father   . Alcohol abuse       uncle  . Tuberculosis      aunt  . Thyroid disease Sister     unknown type  . Breast cancer Mother   . Emphysema Maternal Grandmother   . Emphysema Mother     Social History:  reports that she has never smoked. She does not have any smokeless tobacco history on file. She reports that she does not drink alcohol or use illicit drugs.    Review of Systems       Lipids: She has increase in triglycerides and has been prescribed Vytorin by PCP, also on fenofibrate. However lipids are not controlled. She thinks she is usually watching her diet  Lab Results  Component Value Date   CHOL 217* 07/10/2013   HDL 39.20 07/10/2013   LDLCALC  06/05/2007    91        Total Cholesterol/HDL:CHD Risk Coronary Heart Disease Risk Table                     Men   Women  1/2 Average Risk   3.4   3.3  Average Risk       5.0   4.4  2 X Average Risk   9.6   7.1  3 X Average Risk  23.4   11.0        Use the calculated Patient Ratio above and the CHD Risk Table to determine the patient's CHD Risk.        ATP III CLASSIFICATION (LDL):  <100     mg/dL   Optimal  100-129  mg/dL   Near or Above                    Optimal  130-159  mg/dL   Borderline  160-189  mg/dL   High  >190     mg/dL   Very High   LDLDIRECT 140.9 07/10/2013   TRIG 247.0* 07/10/2013   CHOLHDL 6 07/10/2013       She has a history of  depression  treated with Zoloft  Complications: Hepatic steatosis  Physical Examination:  BP 140/98 mmHg  Pulse 98  Temp(Src) 97.8 F (36.6 C) (Oral)  Ht 5\' 4"  (1.626 m)  Wt 212 lb (96.163 kg)  BMI 36.37 kg/m2  SpO2 94%  LMP 06/11/2012     ASSESSMENT:  Diabetes type 2, uncontrolled   She is again very noncompliant with her follow-up and also noncompliant with instructions for exercise, postprandial glucose monitoring, taking her evening insulin and not being able to lose weight with moderation of diet She has difficulty remembering to take her evening insulin and will do better  with a program using a V-go pump This will also allow her to be more flexible with her mealtime coverage and overall get better control with lower doses of insulin Still taking maximum dose Kombiglyze XR but discussed options for other medications that may help her with weight loss also, may be a candidate for once a day Xigduo  PLAN:  She is more open to using the V-go pump and will have her discuss this with nurse educator in detail today After  insurance verification she can try this next week Mostly likely will need smaller doses of boluses and basal rate with this She can try to 30 units basal to start with and probably needs 4-6 units coverage for meals depending on meal size  Since she is planning to go on a Nutrisystem diet will not consider a medication like Wilder Glade as it Encouraged her to start a walking program and she thinks she can start doing this at the gym with her husband.  Her daughter was also present today and will motivate her more Better compliance with glucose monitoring including after supper Will have her follow-up within a week after starting the pump  LIPIDS: Needs follow-up as she usually has poor control on statin alone Consider adding WelChol for lipids.   Counseling time over 50% of today's 25 minute visit  Followup in 3 months  Rodert Hinch 08/26/2014, 4:22 PM   No visits with results within 1 Week(s) from this visit. Latest known visit with results is:  Abstract on 06/26/2014  Component Date Value Ref Range Status  . HM Diabetic Eye Exam 06/26/2014 No Retinopathy  No Retinopathy Final

## 2014-09-03 ENCOUNTER — Other Ambulatory Visit: Payer: Self-pay | Admitting: Endocrinology

## 2014-09-03 ENCOUNTER — Other Ambulatory Visit: Payer: Self-pay | Admitting: Internal Medicine

## 2014-09-03 NOTE — Telephone Encounter (Signed)
Faxed script to [facility].../lmb 

## 2014-09-09 ENCOUNTER — Ambulatory Visit (INDEPENDENT_AMBULATORY_CARE_PROVIDER_SITE_OTHER): Payer: 59 | Admitting: Pulmonary Disease

## 2014-09-09 ENCOUNTER — Encounter: Payer: Self-pay | Admitting: Pulmonary Disease

## 2014-09-09 VITALS — BP 120/66 | HR 69 | Temp 97.0°F | Ht 64.0 in | Wt 208.0 lb

## 2014-09-09 DIAGNOSIS — G4733 Obstructive sleep apnea (adult) (pediatric): Secondary | ICD-10-CM

## 2014-09-09 NOTE — Assessment & Plan Note (Signed)
The patient has severe obstructive sleep apnea by her recent home study, and will need to be started on C Pap while working on weight loss. I will start the patient on the auto setting with a limited pressure range to allow for desensitization, and we can look at her downloaded at the next visit to see if we need to expand the pressure range. I've also encouraged her to work aggressively on weight loss.

## 2014-09-09 NOTE — Patient Instructions (Signed)
Will start on cpap at a moderate pressure level.  Please call if having issues with tolerance.  Work on weight loss followup again in 8mos.

## 2014-09-09 NOTE — Progress Notes (Signed)
   Subjective:    Patient ID: Sherry White, female    DOB: 28-Apr-1961, 54 y.o.   MRN: [id number]  HPI The patient comes in today for follow-up of her recent home sleep test. She was found to have severe obstructive sleep apnea, with an AHI of 67 events per hour. I have reviewed the study with her in detail, and answered all of her questions.   Review of Systems  Constitutional: Negative for fever and unexpected weight change.  HENT: Negative for congestion, dental problem, ear pain, nosebleeds, postnasal drip, rhinorrhea, sinus pressure, sneezing, sore throat and trouble swallowing.   Eyes: Negative for redness and itching.  Respiratory: Negative for cough, chest tightness, shortness of breath and wheezing.   Cardiovascular: Negative for palpitations and leg swelling.  Gastrointestinal: Negative for nausea and vomiting.  Genitourinary: Negative for dysuria.  Musculoskeletal: Negative for joint swelling.  Skin: Negative for rash.  Neurological: Negative for headaches.  Hematological: Does not bruise/bleed easily.  Psychiatric/Behavioral: Negative for dysphoric mood. The patient is not nervous/anxious.        Objective:   Physical Exam Obese female in no acute distress Nose without purulence or discharge noted Neck without lymphadenopathy or thyromegaly Lower extremities with no significant edema, no cyanosis Alert and oriented, moves all 4 extremities.       Assessment & Plan:

## 2014-09-22 ENCOUNTER — Ambulatory Visit (AMBULATORY_SURGERY_CENTER): Payer: Self-pay

## 2014-09-22 VITALS — Ht 64.0 in | Wt 210.2 lb

## 2014-09-22 DIAGNOSIS — Z8371 Family history of colonic polyps: Secondary | ICD-10-CM

## 2014-09-22 MED ORDER — NA SULFATE-K SULFATE-MG SULF 17.5-3.13-1.6 GM/177ML PO SOLN: ORAL | Status: DC

## 2014-09-22 NOTE — Progress Notes (Signed)
Per pt, no allergies to soy or egg products.Pt not taking any weight loss meds or using  O2 at home. 

## 2014-09-30 ENCOUNTER — Encounter: Payer: Self-pay | Admitting: Internal Medicine

## 2014-10-06 ENCOUNTER — Encounter: Payer: Self-pay | Admitting: Internal Medicine

## 2014-10-06 ENCOUNTER — Other Ambulatory Visit: Payer: Self-pay | Admitting: Internal Medicine

## 2014-10-06 ENCOUNTER — Ambulatory Visit (AMBULATORY_SURGERY_CENTER): Payer: 59 | Admitting: Internal Medicine

## 2014-10-06 VITALS — BP 123/67 | HR 66 | Temp 97.7°F | Resp 14 | Ht 64.0 in | Wt 210.0 lb

## 2014-10-06 DIAGNOSIS — D123 Benign neoplasm of transverse colon: Secondary | ICD-10-CM | POA: Diagnosis not present

## 2014-10-06 DIAGNOSIS — Z1211 Encounter for screening for malignant neoplasm of colon: Secondary | ICD-10-CM

## 2014-10-06 DIAGNOSIS — D125 Benign neoplasm of sigmoid colon: Secondary | ICD-10-CM

## 2014-10-06 LAB — GLUCOSE, CAPILLARY
Glucose-Capillary: 121 mg/dL — ABNORMAL HIGH (ref 70–99)
Glucose-Capillary: 105 mg/dL — ABNORMAL HIGH (ref 70–99)

## 2014-10-06 MED ORDER — SODIUM CHLORIDE 0.9 % IV SOLN: 500.0000 mL | INTRAVENOUS | Status: DC

## 2014-10-06 NOTE — Op Note (Signed)
[facility]  [address] [place], [id number]   COLONOSCOPY PROCEDURE REPORT  PATIENT: Sherry White, Sherry White  MR#: [id number] BIRTHDATE: 01-22-1961 , 53  yrs. old GENDER: female ENDOSCOPIST: Jerene Bears, MD REFERRED JA:SNKNL John, M.D. PROCEDURE DATE:  10/06/2014 PROCEDURE:   Colonoscopy, screening and Colonoscopy with snare polypectomy First Screening Colonoscopy - Avg.  risk and is 50 yrs.  old or older Yes.  Prior Negative Screening - Now for repeat screening. N/A  History of Adenoma - Now for follow-up colonoscopy & has been > or = to 3 yrs.  N/A  Polyps Removed Today ASA CLASS:   Class III INDICATIONS:Screening for colonic neoplasia and Colorectal Neoplasm Risk Assessment for this procedure is average risk. MEDICATIONS: Monitored anesthesia care, Propofol 300 mg IV, and Lidocaine 200 mg IV  DESCRIPTION OF PROCEDURE:   After the risks benefits and alternatives of the procedure were thoroughly explained, informed consent was obtained.  The digital rectal exam revealed no rectal mass.   The LB [id number]  endoscope was introduced through the anus and advanced to the cecum, which was identified by both the appendix and ileocecal valve. No adverse events experienced. The quality of the prep was good.  (Suprep was used)  The instrument was then slowly withdrawn as the colon was fully examined.   COLON FINDINGS: Three sessile polyps ranging between 3-3mm in size were found in the transverse colon(2) and sigmoid colon (1). Polypectomies were performed with a cold snare.  The resection was complete, the polyp tissue was completely retrieved and sent to histology.   The examination was otherwise normal.  Retroflexed views revealed internal hemorrhoids. The time to cecum = 3.8 Withdrawal time = 14.6   The scope was withdrawn and the procedure completed. COMPLICATIONS: There were no immediate complications.  ENDOSCOPIC IMPRESSION: 1.   Three sessile polyps  ranging between 3-25mm in size were found in the transverse colon and sigmoid colon; polypectomies were performed with a cold snare 2.   The examination was otherwise normal  RECOMMENDATIONS: 1.  Await pathology results 2.  If the polyps removed today are proven to be adenomatous (pre-cancerous) polyps, you will need a colonoscopy in 3 years. Otherwise you should continue to follow colorectal cancer screening guidelines for "routine risk" patients with a colonoscopy in 10 years.  You will receive a letter within 1-2 weeks with the results of your biopsy as well as final recommendations.  Please call my office if you have not received a letter after 3 weeks.  eSigned:  Jerene Bears, MD 2014-10-06 [id number]   cc: Biagio Borg, MD and The Patient

## 2014-10-06 NOTE — Patient Instructions (Signed)
YOU HAD AN ENDOSCOPIC PROCEDURE TODAY AT [facility]:   Refer to the procedure report that was given to you for any specific questions about what was found during the examination.  If the procedure report does not answer your questions, please call your gastroenterologist to clarify.  If you requested that your care partner not be given the details of your procedure findings, then the procedure report has been included in a sealed envelope for you to review at your convenience later.  YOU SHOULD EXPECT: Some feelings of bloating in the abdomen. Passage of more gas than usual.  Walking can help get rid of the air that was put into your GI tract during the procedure and reduce the bloating. If you had a lower endoscopy (such as a colonoscopy or flexible sigmoidoscopy) you may notice spotting of blood in your stool or on the toilet paper. If you underwent a bowel prep for your procedure, you may not have a normal bowel movement for a few days.  Please Note:  You might notice some irritation and congestion in your nose or some drainage.  This is from the oxygen used during your procedure.  There is no need for concern and it should clear up in a day or so.  SYMPTOMS TO REPORT IMMEDIATELY:   Following lower endoscopy (colonoscopy or flexible sigmoidoscopy):  Excessive amounts of blood in the stool  Significant tenderness or worsening of abdominal pains  Swelling of the abdomen that is new, acute  Fever of 100F or higher  For urgent or emergent issues, a gastroenterologist can be reached at any hour by calling [phone].   DIET: Your first meal following the procedure should be a small meal and then it is ok to progress to your normal diet. Heavy or fried foods are harder to digest and may make you feel nauseous or bloated.  Likewise, meals heavy in dairy and vegetables can increase bloating.  Drink plenty of fluids but you should avoid alcoholic beverages for 24  hours.  ACTIVITY:  You should plan to take it easy for the rest of today and you should NOT DRIVE or use heavy machinery until tomorrow (because of the sedation medicines used during the test).    FOLLOW UP: Our staff will call the number listed on your records the next business day following your procedure to check on you and address any questions or concerns that you may have regarding the information given to you following your procedure. If we do not reach you, we will leave a message.  However, if you are feeling well and you are not experiencing any problems, there is no need to return our call.  We will assume that you have returned to your regular daily activities without incident.  If any biopsies were taken you will be contacted by phone or by letter within the next 1-3 weeks.  Please call us at [phone] if you have not heard about the biopsies in 3 weeks.    SIGNATURES/CONFIDENTIALITY: You and/or your care partner have signed paperwork which will be entered into your electronic medical record.  These signatures attest to the fact that that the information above on your After Visit Summary has been reviewed and is understood.  Full responsibility of the confidentiality of this discharge information lies with you and/or your care-partner.  Polyp, hemorrhoid and hemorroid banding information given.

## 2014-10-06 NOTE — Progress Notes (Signed)
Report to PACU, RN, vss, BBS= Clear.  

## 2014-10-06 NOTE — Progress Notes (Signed)
Called to room to assist during endoscopic procedure.  Patient ID and intended procedure confirmed with present staff. Received instructions for my participation in the procedure from the performing physician.  

## 2014-10-07 ENCOUNTER — Telehealth: Payer: Self-pay

## 2014-10-07 NOTE — Telephone Encounter (Signed)
Left message on answering machine. 

## 2014-10-14 ENCOUNTER — Encounter: Payer: Self-pay | Admitting: Internal Medicine

## 2014-11-02 ENCOUNTER — Encounter: Payer: Self-pay | Admitting: Pulmonary Disease

## 2014-11-02 ENCOUNTER — Ambulatory Visit (INDEPENDENT_AMBULATORY_CARE_PROVIDER_SITE_OTHER): Payer: 59 | Admitting: Pulmonary Disease

## 2014-11-02 VITALS — BP 118/66 | HR 92 | Temp 98.0°F | Ht 64.0 in | Wt 216.2 lb

## 2014-11-02 DIAGNOSIS — G4733 Obstructive sleep apnea (adult) (pediatric): Secondary | ICD-10-CM

## 2014-11-02 NOTE — Progress Notes (Signed)
   Subjective:    Patient ID: Sherry White, female    DOB: 1961-03-24, 54 y.o.   MRN: [id number]  HPI Patient comes in today for follow-up of her obstructive sleep apnea. Started on C Pap at the last visit, and has seen significant improvement in her sleep and daytime alertness. Her download shows adequate compliance, with good control of her AHI. He is having a mask fitting issues, with leak on her download.   Review of Systems  Constitutional: Negative for fever and unexpected weight change.  HENT: Negative for congestion, dental problem, ear pain, nosebleeds, postnasal drip, rhinorrhea, sinus pressure, sneezing, sore throat and trouble swallowing.   Eyes: Negative for redness and itching.  Respiratory: Negative for cough, chest tightness, shortness of breath and wheezing.   Cardiovascular: Negative for palpitations and leg swelling.  Gastrointestinal: Negative for nausea and vomiting.  Genitourinary: Negative for dysuria.  Musculoskeletal: Negative for joint swelling.  Skin: Negative for rash.  Neurological: Negative for headaches.  Hematological: Does not bruise/bleed easily.  Psychiatric/Behavioral: Negative for dysphoric mood. The patient is not nervous/anxious.        Objective:   Physical Exam Overweight female in no acute distress Nose without purulence or discharge noted Neck without lymphadenopathy or thyromegaly No skin breakdown or pressure necrosis from the C Pap mask Lower extremities with mild edema, no cyanosis Alert and oriented, does not appear to be sleepy, moves all 4 extremities.       Assessment & Plan:

## 2014-11-02 NOTE — Assessment & Plan Note (Signed)
The patient has been wearing C Pap compliantly on the auto setting, and she feels that she is greatly improved with respect to her sleep and daytime alertness. She is having issues with her mask fit, and would like to try something different. I have recommended a different type of mask, and we'll send the order to her home care company. I've asked her to continue on her current C Pap setting, and to work aggressively on weight loss.

## 2014-11-02 NOTE — Patient Instructions (Signed)
Stay on cpap with the auto setting, and let us know if you are having breakthru apnea by your APP. Would consider trying the "Evalee Mutton view" cpap mask since you are having issues with your current mask. Work on weight loss followup with Dr. Halford Chessman in 64mos

## 2014-11-17 ENCOUNTER — Telehealth: Payer: Self-pay

## 2014-11-17 NOTE — Telephone Encounter (Signed)
Pt stated she would fax over recent HgA1c

## 2015-01-11 ENCOUNTER — Other Ambulatory Visit: Payer: Self-pay | Admitting: Internal Medicine

## 2015-02-10 ENCOUNTER — Other Ambulatory Visit: Payer: Self-pay

## 2015-02-10 MED ORDER — ALPRAZOLAM 0.25 MG PO TABS: 0.2500 mg | ORAL_TABLET | Freq: Three times a day (TID) | ORAL | Status: DC | PRN

## 2015-02-10 NOTE — Telephone Encounter (Signed)
Done hardcopy to Dahlia  

## 2015-02-10 NOTE — Telephone Encounter (Signed)
Rx faxed to pharmacy  

## 2015-04-09 ENCOUNTER — Other Ambulatory Visit: Payer: Self-pay | Admitting: Internal Medicine

## 2015-08-11 ENCOUNTER — Other Ambulatory Visit: Payer: Self-pay | Admitting: Endocrinology

## 2015-08-31 ENCOUNTER — Other Ambulatory Visit (INDEPENDENT_AMBULATORY_CARE_PROVIDER_SITE_OTHER): Payer: 59

## 2015-08-31 ENCOUNTER — Other Ambulatory Visit: Payer: Self-pay | Admitting: *Deleted

## 2015-08-31 DIAGNOSIS — E042 Nontoxic multinodular goiter: Secondary | ICD-10-CM

## 2015-08-31 DIAGNOSIS — E785 Hyperlipidemia, unspecified: Secondary | ICD-10-CM | POA: Diagnosis not present

## 2015-08-31 DIAGNOSIS — E119 Type 2 diabetes mellitus without complications: Secondary | ICD-10-CM

## 2015-08-31 LAB — COMPREHENSIVE METABOLIC PANEL
ALT: 78 U/L — ABNORMAL HIGH (ref 0–35)
AST: 62 U/L — ABNORMAL HIGH (ref 0–37)
Albumin: 4.1 g/dL (ref 3.5–5.2)
Alkaline Phosphatase: 124 U/L — ABNORMAL HIGH (ref 39–117)
BUN: 16 mg/dL (ref 6–23)
CO2: 30 mEq/L (ref 19–32)
Calcium: 9.9 mg/dL (ref 8.4–10.5)
Chloride: 99 mEq/L (ref 96–112)
Creatinine, Ser: 0.86 mg/dL (ref 0.40–1.20)
GFR: 73.56 mL/min (ref >60.00)
Glucose, Bld: 151 mg/dL — ABNORMAL HIGH (ref 70–99)
Potassium: 4.1 mEq/L (ref 3.5–5.1)
Sodium: 134 mEq/L — ABNORMAL LOW (ref 135–145)
Total Bilirubin: 0.4 mg/dL (ref 0.2–1.2)
Total Protein: 7.7 g/dL (ref 6.0–8.3)

## 2015-08-31 LAB — URINALYSIS, ROUTINE W REFLEX MICROSCOPIC
Bilirubin Urine: NEGATIVE
Ketones, ur: NEGATIVE
Leukocytes, UA: NEGATIVE
Nitrite: NEGATIVE
Specific Gravity, Urine: >=1.03 — AB (ref 1.000–1.030)
Urine Glucose: 100 — AB
Urobilinogen, UA: 0.2 (ref 0.0–1.0)
pH: 6 (ref 5.0–8.0)

## 2015-08-31 LAB — LIPID PANEL
Cholesterol: 268 mg/dL — ABNORMAL HIGH (ref 0–200)
HDL: 35.8 mg/dL — ABNORMAL LOW (ref >39.00)
Total CHOL/HDL Ratio: 7
Triglycerides: 447 mg/dL — ABNORMAL HIGH (ref 0.0–149.0)

## 2015-08-31 LAB — T4, FREE: Free T4: 0.72 ng/dL (ref 0.60–1.60)

## 2015-08-31 LAB — LDL CHOLESTEROL, DIRECT: Direct LDL: 146 mg/dL

## 2015-08-31 LAB — HEMOGLOBIN A1C: Hgb A1c MFr Bld: 10.4 % — ABNORMAL HIGH (ref 4.6–6.5)

## 2015-08-31 LAB — TSH: TSH: 2.46 u[IU]/mL (ref 0.35–4.50)

## 2015-09-06 ENCOUNTER — Ambulatory Visit (INDEPENDENT_AMBULATORY_CARE_PROVIDER_SITE_OTHER): Payer: 59 | Admitting: Endocrinology

## 2015-09-06 ENCOUNTER — Encounter: Payer: Self-pay | Admitting: Endocrinology

## 2015-09-06 VITALS — BP 118/80 | HR 96 | Temp 98.0°F | Resp 14 | Ht 64.0 in | Wt 211.6 lb

## 2015-09-06 DIAGNOSIS — Z794 Long term (current) use of insulin: Secondary | ICD-10-CM | POA: Diagnosis not present

## 2015-09-06 DIAGNOSIS — E785 Hyperlipidemia, unspecified: Secondary | ICD-10-CM | POA: Diagnosis not present

## 2015-09-06 DIAGNOSIS — E1165 Type 2 diabetes mellitus with hyperglycemia: Secondary | ICD-10-CM | POA: Diagnosis not present

## 2015-09-06 NOTE — Patient Instructions (Signed)
Insulin 36 in am and 28 at dinner  Check blood sugars on waking up 4-6  times a week  Also check blood sugars about 2 hours after a meal and do this after different meals by rotation  Recommended blood sugar levels on waking up is 90-130 and about 2 hours after meal is 130-160  Please bring your blood sugar monitor to each visit, thank you

## 2015-09-06 NOTE — Progress Notes (Signed)
Patient ID: Sherry White, female   DOB: 17-Apr-1961, 55 y.o.   MRN: [id number]   Reason for Appointment : Followup for Type 2 Diabetes  History of Present Illness           Diagnosis: Type 2 diabetes mellitus, date of diagnosis: 12/2012         Past history: For 2 months prior to diagnosis the patient had been having increased thirst and urination along with some blurred vision She was seen by her primary care physician and glucose was 593 an A1c of 11.4 Her urine showed small ketones also Because of her high sugars she was started on Levemir insulin. This was subsequently changed to NovoLog 0000000 for simplicity and postprandial control and 8/14.  INSULIN regimen: NovoLog mix insulin: 28 units acb 18 acs  RECENT history:     She has not been seen in follow-up for over a year again Her blood sugars are totally out of control with A1c going up to over 10% She has had higher blood sugars for about 2 months or so   Current management, blood sugar patterns and problems:  She thinks she is taking her insulin as directed although may occasionally take it after eating in the evening  Her blood sugars have been consistently high all day long especially after supper  Even though she was concerned about her high readings for several weeks she did not call for appointment  She says she tries to either skip a meal or try to walk when her blood sugars are very high  She tends to get thirsty but is trying to watch her drinks  Not following any diet and had not scheduled appointment with dietitian as recommended   Highest reading was last night after eating pasta  Her weight has not come down much except about 5 pounds despite higher readings  Still not motivated to exercise despite previous consistent reminders, may only walk at times  Glucose monitoring:  done about twice a day      Glucometer:   One Touch Verio  Blood Glucose readings from record:  Mean values apply  above for all meters except median for One Touch  PRE-MEAL Fasting Lunch Dinner Bedtime Overall  Glucose range: 214-351   131-292  227-415    Mean/median: 270   256  280  267+/-68     Hypoglycemia: None recently  Self-care: DIET: Inconsistent and not planning meals or restricting carbohydrates.     Meals: 3 meals per day.She has not had appointment for meal planning session as yet Exercise: some walking at times                  Wt Readings from Last 3 Encounters:  09/06/15 211 lb 9.6 oz (95.981 kg)  11/02/14 216 lb 3.2 oz (98.068 kg)  10/06/14 210 lb (95.255 kg)     Lab Results  Component Value Date   HGBA1C 10.4* 08/31/2015   HGBA1C 6.1 07/10/2013   HGBA1C 11.4* 01/03/2013   Lab Results  Component Value Date   LDLCALC  06/05/2007    91        Total Cholesterol/HDL:CHD Risk Coronary Heart Disease Risk Table                     Men   Women  1/2 Average Risk   3.4   3.3  Average Risk       5.0   4.4  2 X Average  Risk   9.6   7.1  3 X Average Risk  23.4   11.0        Use the calculated Patient Ratio above and the CHD Risk Table to determine the patient's CHD Risk.        ATP III CLASSIFICATION (LDL):  <100     mg/dL   Optimal  100-129  mg/dL   Near or Above                    Optimal  130-159  mg/dL   Borderline  160-189  mg/dL   High  >190     mg/dL   Very High   CREATININE 0.86 08/31/2015       Medication List       This list is accurate as of: 09/06/15  9:08 PM.  Always use your most recent med list.               ALPRAZolam 0.25 MG tablet  Commonly known as:  XANAX  Take 1 tablet (0.25 mg total) by mouth 3 (three) times daily as needed for anxiety.     aspirin 81 MG EC tablet  Take 81 mg by mouth daily.     ezetimibe-simvastatin 10-40 MG tablet  Commonly known as:  VYTORIN  Take 1 tablet by mouth at bedtime.     fenofibrate 160 MG tablet  Take 1 tablet by mouth  daily     Fluticasone-Salmeterol 250-50 MCG/DOSE Aepb  Commonly known as:   ADVAIR DISKUS  Inhale 1 puff into the lungs 2 (two) times daily.     glucose blood test strip  Commonly known as:  ONETOUCH VERIO  1 each by Other route 2 (two) times daily. Use to check blood sugars twice a day Dx E11.9     glucose blood test strip  Commonly known as:  ONETOUCH VERIO  1 each by Other route 2 (two) times daily. Use to check blood sugars twice a day Dx E11.9     ONETOUCH VERIO test strip  Generic drug:  glucose blood  CHECK BLOOD SUGARS TWICE A DAY     Insulin Pen Needle 32G X 4 MM Misc  Commonly known as:  BD PEN NEEDLE NANO U/F  Use as directed to inject insulin into skin as indicated by your physician.     KOMBIGLYZE XR 2.09-998 MG Tb24  Generic drug:  Saxagliptin-Metformin  Take 2 pills at bedtime     lisinopril 40 MG tablet  Commonly known as:  PRINIVIL,ZESTRIL  Take 1 tablet by mouth  daily     NOVOLOG MIX 70/30 FLEXPEN (70-30) 100 UNIT/ML FlexPen  Generic drug:  insulin aspart protamine - aspart  INJECT 26 UNITS IN THE MORNING AND 16 UNITS AT DINNER DAILY     ONETOUCH DELICA LANCETS [id number] Misc  Use to check blood sugars twice a day Dx [id number]     ONETOUCH DELICA LANCETS [id number] Misc  USE AS DIRECTED THREE TIMES DAILY TO CHECK BLOOD SUGAR.     sertraline 100 MG tablet  Commonly known as:  ZOLOFT  Take 1 tablet by mouth two  times daily        Allergies:  Allergies  Allergen Reactions  . Amoxicillin Rash    Past Medical History  Diagnosis Date  . Glucose intolerance (impaired glucose tolerance)   . Disc disease, degenerative, lumbar or lumbosacral   . MVP (mitral valve prolapse)   . ANXIETY 06/17/2007  . ASTHMA 06/17/2007  .  CHEST PAIN 06/17/2007  . DEPRESSION 06/17/2007  . GOITER, MULTINODULAR 10/22/2008  . HYPERSOMNIA 12/06/2009  . HYPERTENSION 06/17/2007  . HYPERTRIGLYCERIDEMIA 06/17/2007  . Impaired glucose tolerance 11/15/2010  . Left lateral epicondylitis 11/15/2010  . NASH (nonalcoholic steatohepatitis) 06/08/2011  . Right carpal tunnel  syndrome 11/15/2010  . THYROID NODULE, RIGHT 10/19/2008  . Hyperlipidemia 07/13/2011  . Diabetes mellitus without complication ([place])   . OSA (obstructive sleep apnea)   . TIA (transient ischemic attack)     2 TIA-20 years ago  . Sleep apnea     Past Surgical History  Procedure Laterality Date  . Cholecystectomy    . Biopsy breast  june 2010    [facility]  . Wisdom tooth extraction    . Inguinal hernia repair      1968/1969 BIL    Family History  Problem Relation Age of Onset  . Hypertension Father   . Diabetes Father   . Coronary artery disease Father   . Hypertension Mother   . Diabetes Mother   . Breast cancer Mother   . Emphysema Mother   . COPD Mother   . Colon polyps Mother   . Alcohol abuse      uncle  . Tuberculosis      aunt  . Thyroid disease Sister     unknown type  . Emphysema Maternal Grandmother     Social History:  reports that she has never smoked. She has never used smokeless tobacco. She reports that she does not drink alcohol or use illicit drugs.    Review of Systems       Lipids: She has increase in triglycerides and has been prescribed Vytorin by PCP, also on fenofibrate. HoweverNot taking these regularly and lipids are not controlled.     Lab Results  Component Value Date   CHOL 268* 08/31/2015   HDL 35.80* 08/31/2015   LDLCALC  06/05/2007    91        Total Cholesterol/HDL:CHD Risk Coronary Heart Disease Risk Table                     Men   Women  1/2 Average Risk   3.4   3.3  Average Risk       5.0   4.4  2 X Average Risk   9.6   7.1  3 X Average Risk  23.4   11.0        Use the calculated Patient Ratio above and the CHD Risk Table to determine the patient's CHD Risk.        ATP III CLASSIFICATION (LDL):  <100     mg/dL   Optimal  100-129  mg/dL   Near or Above                    Optimal  130-159  mg/dL   Borderline  160-189  mg/dL   High  >190     mg/dL   Very High   LDLDIRECT 146.0 08/31/2015    TRIG * 08/31/2015    447.0 Triglyceride is over 400; calculations on Lipids are invalid.   CHOLHDL 7 08/31/2015       She has a history of  depression  treated with Zoloft  Complications: Hepatic steatosis, Diagnosis initially made in 2012  Physical Examination:  BP 118/80 mmHg  Pulse 96  Temp(Src) 98 F (36.7 C) (Oral)  Resp 14  Ht 5\' 4"  (1.626 m)  Wt  211 lb 9.6 oz (95.981 kg)  BMI 36.30 kg/m2  SpO2 96%  LMP 06/11/2012     ASSESSMENT:  Diabetes type 2, uncontrolled   See history of present illness for detailed discussion of current diabetes management, blood sugar patterns and problems identified  She is again very noncompliant with her follow-up and also noncompliant with instructions for diet and exercise Appears to have significant progression of her diabetes with increase in blood sugars even with continuing the same regimen that she had last year with fairly good control Most likely has had progression of her insulin deficiency; however with negative urine ketones did not appear to have type 1 diabetes at this time  HYPERLIPIDEMIA: Poorly controlled and she has not been taking her Vytorin and fenofibrate initially prescribed by PCP   PLAN:   Increase insulin especially in the evening significantly to overcome glucose toxicity.  New doses will be 36--28  She will also increase her insulin every 4-5 days by 2 units if blood sugars continue to be high.  Discussed which insulin to increase for either morning or evening blood sugar increases  Consistent diet and avoid skipping meals  Start walking regularly for exercise  Need balanced meals and also appointment with diabetes educator and dietitian  She will start the V-go pump on her next visit after establishing her insulin dose  Continue Kombiglyze for now but may also consider using Invokana instead of Onglyza  Consider adding Actos for insulin sensitivity as well as history of fatty liver Will have her  follow-up within a week after starting the V-go pump  LIPIDS: Needs follow-up with her PCP and most likely needs to be on high dose statin and fenofibrate   Counseling time on subjects discussed above is over 50% of today's 25 minute visit  Patient Instructions  Insulin 36 in am and 28 at dinner  Check blood sugars on waking up 4-6  times a week  Also check blood sugars about 2 hours after a meal and do this after different meals by rotation  Recommended blood sugar levels on waking up is 90-130 and about 2 hours after meal is 130-160  Please bring your blood sugar monitor to each visit, thank you    '  Quinnley Colasurdo 09/06/2015, 9:08 PM   Appointment on 08/31/2015  Component Date Value Ref Range Status  . TSH 08/31/2015 2.46  0.35 - 4.50 uIU/mL Final  . Free T4 08/31/2015 0.72  0.60 - 1.60 ng/dL Final  . Cholesterol 08/31/2015 268* 0 - 200 mg/dL Final   ATP III Classification       Desirable:  < 200 mg/dL               Borderline High:  200 - 239 mg/dL          High:  > = 240 mg/dL  . Triglycerides 08/31/2015 447.0 Triglyceride is over 400; calculations on Lipids are invalid.* 0.0 - 149.0 mg/dL Final   Normal:  <150 mg/dLBorderline High:  150 - 199 mg/dL  . HDL 08/31/2015 35.80* >39.00 mg/dL Final  . Total CHOL/HDL Ratio 08/31/2015 7   Final                  Men          Women1/2 Average Risk     3.4          3.3Average Risk          5.0  4.42X Average Risk          9.6          7.13X Average Risk          15.0          11.0                      . Hgb A1c MFr Bld 08/31/2015 10.4* 4.6 - 6.5 % Final   Glycemic Control Guidelines for People with Diabetes:Non Diabetic:  <6%Goal of Therapy: <7%Additional Action Suggested:  >8%   . Sodium 08/31/2015 134* 135 - 145 mEq/L Final  . Potassium 08/31/2015 4.1  3.5 - 5.1 mEq/L Final  . Chloride 08/31/2015 99  96 - 112 mEq/L Final  . CO2 08/31/2015 30  19 - 32 mEq/L Final  . Glucose, Bld 08/31/2015 151* 70 - 99 mg/dL Final  . BUN  08/31/2015 16  6 - 23 mg/dL Final  . Creatinine, Ser 08/31/2015 0.86  0.40 - 1.20 mg/dL Final  . Total Bilirubin 08/31/2015 0.4  0.2 - 1.2 mg/dL Final  . Alkaline Phosphatase 08/31/2015 124* 39 - 117 U/L Final  . AST 08/31/2015 62* 0 - 37 U/L Final  . ALT 08/31/2015 78* 0 - 35 U/L Final  . Total Protein 08/31/2015 7.7  6.0 - 8.3 g/dL Final  . Albumin 08/31/2015 4.1  3.5 - 5.2 g/dL Final  . Calcium 08/31/2015 9.9  8.4 - 10.5 mg/dL Final  . GFR 08/31/2015 73.56  >60.00 mL/min Final  . Color, Urine 08/31/2015 YELLOW  Yellow;Lt. Yellow Final  . APPearance 08/31/2015 CLEAR  Clear Final  . Specific Gravity, Urine 08/31/2015 >=1.030* 1.000 - 1.030 Final  . pH 08/31/2015 6.0  5.0 - 8.0 Final  . Total Protein, Urine 08/31/2015 TRACE* Negative Final  . Urine Glucose 08/31/2015 100* Negative Final  . Ketones, ur 08/31/2015 NEGATIVE  Negative Final  . Bilirubin Urine 08/31/2015 NEGATIVE  Negative Final  . Hgb urine dipstick 08/31/2015 SMALL* Negative Final  . Urobilinogen, UA 08/31/2015 0.2  0.0 - 1.0 Final  . Leukocytes, UA 08/31/2015 NEGATIVE  Negative Final  . Nitrite 08/31/2015 NEGATIVE  Negative Final  . WBC, UA 08/31/2015 0-2/hpf  0-2/hpf Final  . RBC / HPF 08/31/2015 0-2/hpf  0-2/hpf Final  . Mucus, UA 08/31/2015 Presence of* None Final  . Squamous Epithelial / LPF 08/31/2015 Rare(0-4/hpf)  Rare(0-4/hpf) Final  . Bacteria, UA 08/31/2015 Rare(<10/hpf)* None Final  . Direct LDL 08/31/2015 146.0   Final   Optimal:  <100 mg/dLNear or Above Optimal:  100-129 mg/dLBorderline High:  130-159 mg/dLHigh:  160-189 mg/dLVery High:  >190 mg/dL

## 2015-09-07 ENCOUNTER — Other Ambulatory Visit: Payer: Self-pay | Admitting: Endocrinology

## 2015-09-08 ENCOUNTER — Ambulatory Visit (INDEPENDENT_AMBULATORY_CARE_PROVIDER_SITE_OTHER): Payer: 59 | Admitting: Internal Medicine

## 2015-09-08 ENCOUNTER — Encounter: Payer: Self-pay | Admitting: Internal Medicine

## 2015-09-08 VITALS — BP 136/88 | HR 100 | Temp 98.1°F | Ht 64.0 in | Wt 214.0 lb

## 2015-09-08 DIAGNOSIS — J4520 Mild intermittent asthma, uncomplicated: Secondary | ICD-10-CM

## 2015-09-08 DIAGNOSIS — I10 Essential (primary) hypertension: Secondary | ICD-10-CM | POA: Diagnosis not present

## 2015-09-08 DIAGNOSIS — E781 Pure hyperglyceridemia: Secondary | ICD-10-CM | POA: Diagnosis not present

## 2015-09-08 DIAGNOSIS — F411 Generalized anxiety disorder: Secondary | ICD-10-CM

## 2015-09-08 DIAGNOSIS — R6889 Other general symptoms and signs: Secondary | ICD-10-CM

## 2015-09-08 DIAGNOSIS — Z0001 Encounter for general adult medical examination with abnormal findings: Secondary | ICD-10-CM | POA: Diagnosis not present

## 2015-09-08 DIAGNOSIS — J45901 Unspecified asthma with (acute) exacerbation: Secondary | ICD-10-CM

## 2015-09-08 DIAGNOSIS — Z794 Long term (current) use of insulin: Secondary | ICD-10-CM

## 2015-09-08 DIAGNOSIS — E119 Type 2 diabetes mellitus without complications: Secondary | ICD-10-CM

## 2015-09-08 DIAGNOSIS — Z0000 Encounter for general adult medical examination without abnormal findings: Secondary | ICD-10-CM

## 2015-09-08 MED ORDER — ALBUTEROL SULFATE HFA 108 (90 BASE) MCG/ACT IN AERS: 2.0000 | INHALATION_SPRAY | Freq: Four times a day (QID) | RESPIRATORY_TRACT | Status: DC | PRN

## 2015-09-08 MED ORDER — SERTRALINE HCL 100 MG PO TABS: ORAL_TABLET | ORAL | Status: DC

## 2015-09-08 MED ORDER — LISINOPRIL 40 MG PO TABS: ORAL_TABLET | ORAL | Status: DC

## 2015-09-08 MED ORDER — EZETIMIBE-SIMVASTATIN 10-40 MG PO TABS
1.0000 | ORAL_TABLET | Freq: Every day | ORAL | Status: DC
Start: 2015-09-08 — End: 2018-02-13

## 2015-09-08 MED ORDER — FENOFIBRATE 160 MG PO TABS: ORAL_TABLET | ORAL | Status: AC

## 2015-09-08 MED ORDER — FLUTICASONE-SALMETEROL 250-50 MCG/DOSE IN AEPB: 1.0000 | INHALATION_SPRAY | Freq: Two times a day (BID) | RESPIRATORY_TRACT | Status: AC

## 2015-09-08 NOTE — Progress Notes (Signed)
Subjective:    Patient ID: Sherry White, female    DOB: 10/31/1960, 55 y.o.   MRN: [id number]  HPI  Here for wellness and f/u;  Overall doing ok;  Pt denies Chest pain, worsening SOB, DOE, wheezing, orthopnea, PND, worsening LE edema, palpitations, dizziness or syncope.  Pt denies neurological change such as new headache, facial or extremity weakness.  Pt denies polydipsia, polyuria, or low sugar symptoms. Pt states overall good compliance with treatment and medications, good tolerability, and has been trying to follow appropriate diet.  Pt denies worsening depressive symptoms, suicidal ideation or panic. No fever, night sweats, wt loss, loss of appetite, or other constitutional symptoms.  Pt states good ability with ADL's, has low fall risk, home safety reviewed and adequate, no other significant changes in hearing or vision, and only occasionally active with exercise. Bp has been < 140/90 recently.  Past Medical History  Diagnosis Date  . Glucose intolerance (impaired glucose tolerance)   . Disc disease, degenerative, lumbar or lumbosacral   . MVP (mitral valve prolapse)   . ANXIETY 06/17/2007  . ASTHMA 06/17/2007  . CHEST PAIN 06/17/2007  . DEPRESSION 06/17/2007  . GOITER, MULTINODULAR 10/22/2008  . HYPERSOMNIA 12/06/2009  . HYPERTENSION 06/17/2007  . HYPERTRIGLYCERIDEMIA 06/17/2007  . Impaired glucose tolerance 11/15/2010  . Left lateral epicondylitis 11/15/2010  . NASH (nonalcoholic steatohepatitis) 06/08/2011  . Right carpal tunnel syndrome 11/15/2010  . THYROID NODULE, RIGHT 10/19/2008  . Hyperlipidemia 07/13/2011  . Diabetes mellitus without complication ([place])   . OSA (obstructive sleep apnea)   . TIA (transient ischemic attack)     2 TIA-20 years ago  . Sleep apnea    Past Surgical History  Procedure Laterality Date  . Cholecystectomy    . Biopsy breast  june 2010    [facility]  . Wisdom tooth extraction    . Inguinal hernia repair      1968/1969 BIL     reports that she has never smoked. She has never used smokeless tobacco. She reports that she does not drink alcohol or use illicit drugs. family history includes Breast cancer in her mother; COPD in her mother; Colon polyps in her mother; Coronary artery disease in her father; Diabetes in her father and mother; Emphysema in her maternal grandmother and mother; Hypertension in her father and mother; Thyroid disease in her sister. Allergies  Allergen Reactions  . Amoxicillin Rash   Current Outpatient Prescriptions on File Prior to Visit  Medication Sig Dispense Refill  . ALPRAZolam (XANAX) 0.25 MG tablet Take 1 tablet (0.25 mg total) by mouth 3 (three) times daily as needed for anxiety. 90 tablet 2  . aspirin 81 MG EC tablet Take 81 mg by mouth daily.      Marland Kitchen glucose blood (ONETOUCH VERIO) test strip 1 each by Other route 2 (two) times daily. Use to check blood sugars twice a day Dx E11.9 100 each 11  . glucose blood (ONETOUCH VERIO) test strip 1 each by Other route 2 (two) times daily. Use to check blood sugars twice a day Dx E11.9 100 each 3  . Insulin Pen Needle (BD PEN NEEDLE NANO U/F) 32G X 4 MM MISC Use as directed to inject insulin into skin as indicated by your physician. 300 each 1  . KOMBIGLYZE XR 2.09-998 MG TB24 TAKE 2 TABLETS WITH SUPPER 60 tablet 2  . NOVOLOG MIX 70/30 FLEXPEN (70-30) 100 UNIT/ML FlexPen INJECT 26 UNITS IN THE MORNING AND 16 UNITS  AT DINNER DAILY (Patient taking differently: INJECT 36 UNITS IN THE MORNING AND 28 UNITS AT DINNER DAILY) 45 mL 5  . ONETOUCH DELICA LANCETS 99991111 MISC Use to check blood sugars twice a day Dx E11.9 100 each 11  . ONETOUCH DELICA LANCETS 99991111 MISC USE AS DIRECTED THREE TIMES DAILY TO CHECK BLOOD SUGAR. 100 each 0  . ONETOUCH VERIO test strip CHECK BLOOD SUGARS TWICE A DAY 300 each 1   No current facility-administered medications on file prior to visit.    Review of Systems Constitutional: Negative for increased diaphoresis, or other  activity, appetite or siginficant weight change other than noted HENT: Negative for worsening hearing loss, ear pain, facial swelling, mouth sores and neck stiffness.   Eyes: Negative for other worsening pain, redness or visual disturbance.  Respiratory: Negative for choking or stridor Cardiovascular: Negative for other chest pain and palpitations.  Gastrointestinal: Negative for worsening diarrhea, blood in stool, or abdominal distention Genitourinary: Negative for hematuria, flank pain or change in urine volume.  Musculoskeletal: Negative for myalgias or other joint complaints.  Skin: Negative for other color change and wound or drainage.  Neurological: Negative for syncope and numbness. other than noted Hematological: Negative for adenopathy. or other swelling Psychiatric/Behavioral: Negative for hallucinations, SI, self-injury, decreased concentration or other worsening agitation.      Objective:   Physical Exam BP 138/96 mmHg  Pulse 100  Temp(Src) 98.1 F (36.7 C) (Oral)  Ht 5\' 4"  (1.626 m)  Wt 214 lb (97.07 kg)  BMI 36.72 kg/m2  SpO2 97%  LMP 06/11/2012 VS noted, not ill appearing Constitutional: Pt is oriented to person, place, and time. Appears well-developed and well-nourished, in no significant distress Head: Normocephalic and atraumatic  Eyes: Conjunctivae and EOM are normal. Pupils are equal, round, and reactive to light Right Ear: External ear normal.  Left Ear: External ear normal Nose: Nose normal.  Mouth/Throat: Oropharynx is clear and moist  Neck: Normal range of motion. Neck supple. No JVD present. No tracheal deviation present or significant neck LA or mass Cardiovascular: Normal rate, regular rhythm, normal heart sounds and intact distal pulses.   Pulmonary/Chest: Effort normal and breath sounds without rales or wheezing  Abdominal: Soft. Bowel sounds are normal. NT. No HSM  Musculoskeletal: Normal range of motion. Exhibits no edema Lymphadenopathy: Has no  cervical adenopathy.  Neurological: Pt is alert and oriented to person, place, and time. Pt has normal reflexes. No cranial nerve deficit. Motor grossly intact Skin: Skin is warm and dry. No rash noted or new ulcers Psychiatric:  Has normal mood and affect. Behavior is normal.     Assessment & Plan:

## 2015-09-08 NOTE — Progress Notes (Signed)
Pre visit review using our clinic review tool, if applicable. No additional management support is needed unless otherwise documented below in the visit note. 

## 2015-09-08 NOTE — Patient Instructions (Signed)
Please continue all other medications as before, and refills have been done if requested.  Please have the pharmacy call with any other refills you may need.  Please continue your efforts at being more active, low cholesterol diet, and weight control.  You are otherwise up to date with prevention measures today.  Please keep your appointments with your specialists as you may have planned  Please return in 1 year for your yearly visit, or sooner if needed, with Lab testing done 3-5 days before  

## 2015-09-10 DIAGNOSIS — E781 Pure hyperglyceridemia: Secondary | ICD-10-CM | POA: Insufficient documentation

## 2015-09-10 NOTE — Assessment & Plan Note (Signed)

## 2015-09-10 NOTE — Assessment & Plan Note (Signed)
stable overall by history and exam, recent data reviewed with pt, and pt to continue medical treatment as before,  to f/u any worsening symptoms or concerns Lab Results  Component Value Date   WBC 6.2 01/03/2013   HGB 14.1 01/03/2013   HCT 41.0 01/03/2013   PLT 197.0 01/03/2013   GLUCOSE 151* 08/31/2015   CHOL 268* 08/31/2015   TRIG * 08/31/2015    447.0 Triglyceride is over 400; calculations on Lipids are invalid.   HDL 35.80* 08/31/2015   LDLDIRECT 146.0 08/31/2015   LDLCALC  06/05/2007    91        Total Cholesterol/HDL:CHD Risk Coronary Heart Disease Risk Table                     Men   Women  1/2 Average Risk   3.4   3.3  Average Risk       5.0   4.4  2 X Average Risk   9.6   7.1  3 X Average Risk  23.4   11.0        Use the calculated Patient Ratio above and the CHD Risk Table to determine the patient's CHD Risk.        ATP III CLASSIFICATION (LDL):  <100     mg/dL   Optimal  100-129  mg/dL   Near or Above                    Optimal  130-159  mg/dL   Borderline  160-189  mg/dL   High  >190     mg/dL   Very High   ALT 78* 08/31/2015   AST 62* 08/31/2015   NA 134* 08/31/2015   K 4.1 08/31/2015   CL 99 08/31/2015   CREATININE 0.86 08/31/2015   BUN 16 08/31/2015   CO2 30 08/31/2015   TSH 2.46 08/31/2015   INR 0.9 06/04/2007   HGBA1C 10.4* 08/31/2015

## 2015-09-10 NOTE — Assessment & Plan Note (Addendum)
stable overall by history and exam, lasts a1c 6.1, followed per endo,  to f/u any worsening symptoms or concerns

## 2015-09-10 NOTE — Assessment & Plan Note (Signed)
stable overall by history and exam, recent data reviewed with pt, and pt to continue medical treatment as before,  to f/u any worsening symptoms or concerns SpO2 Readings from Last 3 Encounters:  09/08/15 97%  09/06/15 96%  11/02/14 99%

## 2015-09-10 NOTE — Assessment & Plan Note (Signed)
Last triglycerides 247 (2015), for cont;d efforts at wt loss, low fat diet, for f/u lab

## 2015-09-10 NOTE — Assessment & Plan Note (Addendum)
stable overall by history and exam, recent data reviewed with pt, and pt to continue medical treatment as before,  to f/u any worsening symptoms or concerns BP Readings from Last 3 Encounters:  09/08/15 136/88  09/06/15 118/80  11/02/14 118/66   In addition to the time spent performing CPE, I spent an additional 15 minutes face to face,in which greater than 50% of this time was spent in counseling and coordination of care for patient's acute illness as documented.

## 2015-09-20 ENCOUNTER — Ambulatory Visit: Payer: 59 | Admitting: Endocrinology

## 2015-09-20 ENCOUNTER — Encounter: Payer: 59 | Attending: Endocrinology | Admitting: Nutrition

## 2015-09-20 DIAGNOSIS — E1165 Type 2 diabetes mellitus with hyperglycemia: Secondary | ICD-10-CM | POA: Diagnosis not present

## 2015-09-20 DIAGNOSIS — Z794 Long term (current) use of insulin: Secondary | ICD-10-CM | POA: Diagnosis not present

## 2015-09-22 ENCOUNTER — Ambulatory Visit (INDEPENDENT_AMBULATORY_CARE_PROVIDER_SITE_OTHER): Payer: 59 | Admitting: Endocrinology

## 2015-09-22 ENCOUNTER — Encounter: Payer: Self-pay | Admitting: Endocrinology

## 2015-09-22 VITALS — BP 132/82 | HR 93 | Temp 98.0°F | Resp 14 | Ht 64.0 in | Wt 213.0 lb

## 2015-09-22 DIAGNOSIS — E1165 Type 2 diabetes mellitus with hyperglycemia: Secondary | ICD-10-CM

## 2015-09-22 DIAGNOSIS — Z794 Long term (current) use of insulin: Secondary | ICD-10-CM | POA: Diagnosis not present

## 2015-09-22 MED ORDER — PIOGLITAZONE HCL 15 MG PO TABS: 15.0000 mg | ORAL_TABLET | Freq: Every day | ORAL | Status: DC

## 2015-09-22 NOTE — Progress Notes (Signed)
Patient ID: CARRESSA KIESOW, female   DOB: Mar 10, 1961, 55 y.o.   MRN: [id number]   Reason for Appointment : Followup for Type 2 Diabetes  History of Present Illness           Diagnosis: Type 2 diabetes mellitus, date of diagnosis: 12/2012         Past history: For 2 months prior to diagnosis the patient had been having increased thirst and urination along with some blurred vision She was seen by her primary care physician and glucose was 593 an A1c of 11.4 Her urine showed small ketones also Because of her high sugars she was started on Levemir insulin. This was subsequently changed to NovoLog 0000000 for simplicity and postprandial control and 8/14.   RECENT history:   INSULIN regimen: V-go, 40 units pump, mealtime clicks: [id number]  Her blood sugars recently have been totally out of control with A1c going up to over 10% However despite taking 74 units of insulin with twice a day premixed injections her blood sugars were still markedly increased and prior to starting the V-go pump averaging about 270  Current management, blood sugar patterns and problems:  She started the V-go pump on Monday  She has had gradually improvement in her blood sugars although fasting blood sugars are high  She has no difficulty using the pump although it is a little uncomfortable  Fasting blood sugar was 206 today, 165 yesterday  In the evening blood sugars have been 169, 208 PCP  She felt hypoglycemic yesterday morning because of not eating much at breakfast, blood sugar rebounded to 201 by 11:00  Glucose was mildly increased at 169 with eating past the last evening but today at lunchtime was 133  Still not motivated to exercise despite previous consistent reminders, may only walk at times  Glucose monitoring:  done about twice a day      Glucometer:   One Touch Verio  Blood Glucose readings from download as above:  Self-care: DIET: Inconsistent and not planning meals    Meals: 3  meals per day.She has discussed with nurse educator Exercise: some walking at times                  Wt Readings from Last 3 Encounters:  09/22/15 213 lb (96.616 kg)  09/08/15 214 lb (97.07 kg)  09/06/15 211 lb 9.6 oz (95.981 kg)     Lab Results  Component Value Date   HGBA1C 10.4* 08/31/2015   HGBA1C 6.1 07/10/2013   HGBA1C 11.4* 01/03/2013   Lab Results  Component Value Date   LDLCALC  06/05/2007    91        Total Cholesterol/HDL:CHD Risk Coronary Heart Disease Risk Table                     Men   Women  1/2 Average Risk   3.4   3.3  Average Risk       5.0   4.4  2 X Average Risk   9.6   7.1  3 X Average Risk  23.4   11.0        Use the calculated Patient Ratio above and the CHD Risk Table to determine the patient's CHD Risk.        ATP III CLASSIFICATION (LDL):  <100     mg/dL   Optimal  100-129  mg/dL   Near or Above  Optimal  130-159  mg/dL   Borderline  160-189  mg/dL   High  >190     mg/dL   Very High   CREATININE 0.86 08/31/2015       Medication List       This list is accurate as of: 09/22/15  4:52 PM.  Always use your most recent med list.               albuterol 108 (90 Base) MCG/ACT inhaler  Commonly known as:  PROVENTIL HFA;VENTOLIN HFA  Inhale 2 puffs into the lungs every 6 (six) hours as needed for wheezing or shortness of breath.     ALPRAZolam 0.25 MG tablet  Commonly known as:  XANAX  Take 1 tablet (0.25 mg total) by mouth 3 (three) times daily as needed for anxiety.     aspirin 81 MG EC tablet  Take 81 mg by mouth daily.     ezetimibe-simvastatin 10-40 MG tablet  Commonly known as:  VYTORIN  Take 1 tablet by mouth at bedtime.     fenofibrate 160 MG tablet  Take 1 tablet by mouth  daily     Fluticasone-Salmeterol 250-50 MCG/DOSE Aepb  Commonly known as:  ADVAIR DISKUS  Inhale 1 puff into the lungs 2 (two) times daily.     Insulin Pen Needle 32G X 4 MM Misc  Commonly known as:  BD PEN NEEDLE NANO U/F  Use  as directed to inject insulin into skin as indicated by your physician.     KOMBIGLYZE XR 2.09-998 MG Tb24  Generic drug:  Saxagliptin-Metformin  TAKE 2 TABLETS WITH SUPPER     lisinopril 40 MG tablet  Commonly known as:  PRINIVIL,ZESTRIL  Take 1 tablet by mouth  daily     NOVOLOG 100 UNIT/ML injection  Generic drug:  insulin aspart  Inject 78 Units into the skin once. Use 78 units per day with V-Go pump     ONETOUCH DELICA LANCETS 99991111 Misc  USE AS DIRECTED THREE TIMES DAILY TO CHECK BLOOD SUGAR.     ONETOUCH VERIO test strip  Generic drug:  glucose blood  CHECK BLOOD SUGARS TWICE A DAY     pioglitazone 15 MG tablet  Commonly known as:  ACTOS  Take 1 tablet (15 mg total) by mouth daily.     sertraline 100 MG tablet  Commonly known as:  ZOLOFT  Take 1 tablet by mouth two  times daily        Allergies:  Allergies  Allergen Reactions  . Amoxicillin Rash    Past Medical History  Diagnosis Date  . Glucose intolerance (impaired glucose tolerance)   . Disc disease, degenerative, lumbar or lumbosacral   . MVP (mitral valve prolapse)   . ANXIETY 06/17/2007  . ASTHMA 06/17/2007  . CHEST PAIN 06/17/2007  . DEPRESSION 06/17/2007  . GOITER, MULTINODULAR 10/22/2008  . HYPERSOMNIA 12/06/2009  . HYPERTENSION 06/17/2007  . HYPERTRIGLYCERIDEMIA 06/17/2007  . Impaired glucose tolerance 11/15/2010  . Left lateral epicondylitis 11/15/2010  . NASH (nonalcoholic steatohepatitis) 06/08/2011  . Right carpal tunnel syndrome 11/15/2010  . THYROID NODULE, RIGHT 10/19/2008  . Hyperlipidemia 07/13/2011  . Diabetes mellitus without complication ([place])   . OSA (obstructive sleep apnea)   . TIA (transient ischemic attack)     2 TIA-20 years ago  . Sleep apnea     Past Surgical History  Procedure Laterality Date  . Cholecystectomy    . Biopsy breast  june 2010    [facility]  . Wisdom tooth extraction    . Inguinal hernia repair      1968/1969 BIL    Family History    Problem Relation Age of Onset  . Hypertension Father   . Diabetes Father   . Coronary artery disease Father   . Hypertension Mother   . Diabetes Mother   . Breast cancer Mother   . Emphysema Mother   . COPD Mother   . Colon polyps Mother   . Alcohol abuse      uncle  . Tuberculosis      aunt  . Thyroid disease Sister     unknown type  . Emphysema Maternal Grandmother     Social History:  reports that she has never smoked. She has never used smokeless tobacco. She reports that she does not drink alcohol or use illicit drugs.    Review of Systems       Lipids: She has increase in triglycerides and has been prescribed Vytorin by PCP, also on fenofibrate. HoweverNot taking these regularly and lipids are not controlled.    Lab Results  Component Value Date   CHOL 268* 08/31/2015   HDL 35.80* 08/31/2015   LDLCALC  06/05/2007    91        Total Cholesterol/HDL:CHD Risk Coronary Heart Disease Risk Table                     Men   Women  1/2 Average Risk   3.4   3.3  Average Risk       5.0   4.4  2 X Average Risk   9.6   7.1  3 X Average Risk  23.4   11.0        Use the calculated Patient Ratio above and the CHD Risk Table to determine the patient's CHD Risk.        ATP III CLASSIFICATION (LDL):  <100     mg/dL   Optimal  100-129  mg/dL   Near or Above                    Optimal  130-159  mg/dL   Borderline  160-189  mg/dL   High  >190     mg/dL   Very High   LDLDIRECT 146.0 08/31/2015   TRIG * 08/31/2015    447.0 Triglyceride is over 400; calculations on Lipids are invalid.   CHOLHDL 7 08/31/2015       She has a history of  depression  treated with Zoloft  Complications: Hepatic steatosis, Diagnosis initially made in 2012, Untreated  Lab Results  Component Value Date   ALT 78* 08/31/2015    Physical Examination:  BP 132/82 mmHg  Pulse 93  Temp(Src) 98 F (36.7 C)  Resp 14  Ht 5\' 4"  (1.626 m)  Wt 213 lb (96.616 kg)  BMI 36.54 kg/m2  SpO2 97%  LMP  06/11/2012     ASSESSMENT:  Diabetes type 2, uncontrolled   See history of present illness for detailed discussion of current diabetes management, blood sugar patterns and problems identified  She is still having mild hyperglycemia even with starting the V-go pump especially fasting, however was taking over 70 units of insulin with injectable regimen previously Blood sugars after meals have been variable but not consistently high  HYPERLIPIDEMIA: Lipids to be rechecked, was told to resume her medications on the last visit  Fatty liver: Currently untreated  PLAN:  Increase  mealtime boluses at suppertime if eating more carbohydrate May reduce bolus down to 1 or 2 clicks if eating less at breakfast or lunch Start walking regularly Trial of Actos 15 mg daily, discussed benefits and possible side effects, discussed benefits on fatty liver and triglycerides Continue Kombiglyze Discussed timing of glucose monitoring and targets  Patient Instructions  May take 5 clicks at supper with more carbs Extra click for sugar [id number]  Walk daily    '  Staci Carver 09/22/2015, 4:52 PM

## 2015-09-22 NOTE — Patient Instructions (Signed)
May take 5 clicks at supper with more carbs Extra click for sugar [id number]  Walk daily

## 2015-09-23 ENCOUNTER — Telehealth: Payer: Self-pay | Admitting: *Deleted

## 2015-09-23 ENCOUNTER — Other Ambulatory Visit: Payer: Self-pay | Admitting: *Deleted

## 2015-09-23 MED ORDER — INSULIN ASPART 100 UNIT/ML ~~LOC~~ SOLN: SUBCUTANEOUS | Status: DC

## 2015-09-23 MED ORDER — V-GO 40 KIT: PACK | Status: DC

## 2015-09-23 NOTE — Telephone Encounter (Signed)
Pt needs [place] to call in the vgo and insulin to her pharmacy please

## 2015-09-23 NOTE — Telephone Encounter (Signed)
rxs sent

## 2015-09-28 NOTE — Patient Instructions (Signed)
Fill and apply a new V-Go every 24 hours. Stop all Levemir insulin Test blood  Sugars before meals and at bedtime. Call 800 telephone number if questions. See Dr. Dwyane Dee on Friday.

## 2015-09-28 NOTE — Progress Notes (Signed)
This patient was instructed on how to fill, apply and use the V-Go.  She was given a v-go 40 starter kit with 6 V-GOs, and Novolog insulin, instructions for use and an 800 telephone number to call if questions or needing assistance with her V-go. She filled a V-Go with Novolog and applied it to her upper right abdomen, with little help from me.  Written instructions were given for 2 clicks before breakfast, 3 clicks before lunch, and 4 clicks before supper.  She reported good understanding of this and had no final questions. She was given a sheet to record her blood sugars, and told to test ac and HS.   Written instructions were given to stop all Levemir insulin.  She reported good understanding of this.

## 2015-10-08 ENCOUNTER — Other Ambulatory Visit (INDEPENDENT_AMBULATORY_CARE_PROVIDER_SITE_OTHER): Payer: 59

## 2015-10-08 DIAGNOSIS — E1165 Type 2 diabetes mellitus with hyperglycemia: Secondary | ICD-10-CM | POA: Diagnosis not present

## 2015-10-08 DIAGNOSIS — Z794 Long term (current) use of insulin: Secondary | ICD-10-CM | POA: Diagnosis not present

## 2015-10-08 LAB — LIPID PANEL
Cholesterol: 219 mg/dL — ABNORMAL HIGH (ref 0–200)
HDL: 35.8 mg/dL — ABNORMAL LOW (ref >39.00)
NonHDL: 182.91
Total CHOL/HDL Ratio: 6
Triglycerides: 276 mg/dL — ABNORMAL HIGH (ref 0.0–149.0)
VLDL: 55.2 mg/dL — ABNORMAL HIGH (ref 0.0–40.0)

## 2015-10-08 LAB — COMPREHENSIVE METABOLIC PANEL
ALT: 60 U/L — ABNORMAL HIGH (ref 0–35)
AST: 57 U/L — ABNORMAL HIGH (ref 0–37)
Albumin: 4.2 g/dL (ref 3.5–5.2)
Alkaline Phosphatase: 88 U/L (ref 39–117)
BUN: 11 mg/dL (ref 6–23)
CO2: 27 mEq/L (ref 19–32)
Calcium: 9.7 mg/dL (ref 8.4–10.5)
Chloride: 102 mEq/L (ref 96–112)
Creatinine, Ser: 0.81 mg/dL (ref 0.40–1.20)
GFR: 78.79 mL/min (ref >60.00)
Glucose, Bld: 143 mg/dL — ABNORMAL HIGH (ref 70–99)
Potassium: 4.5 mEq/L (ref 3.5–5.1)
Sodium: 135 mEq/L (ref 135–145)
Total Bilirubin: 0.4 mg/dL (ref 0.2–1.2)
Total Protein: 7.6 g/dL (ref 6.0–8.3)

## 2015-10-08 LAB — LDL CHOLESTEROL, DIRECT: Direct LDL: 128 mg/dL

## 2015-10-09 LAB — FRUCTOSAMINE: Fructosamine: 252 umol/L (ref 0–285)

## 2015-10-14 ENCOUNTER — Ambulatory Visit: Payer: 59 | Admitting: Endocrinology

## 2015-11-10 ENCOUNTER — Ambulatory Visit: Payer: 59 | Admitting: Endocrinology

## 2015-11-18 ENCOUNTER — Other Ambulatory Visit: Payer: Self-pay | Admitting: Internal Medicine

## 2015-11-18 ENCOUNTER — Other Ambulatory Visit: Payer: Self-pay | Admitting: Endocrinology

## 2015-12-22 ENCOUNTER — Telehealth: Payer: Self-pay | Admitting: *Deleted

## 2015-12-22 MED ORDER — ALPRAZOLAM 0.25 MG PO TABS: 0.2500 mg | ORAL_TABLET | Freq: Three times a day (TID) | ORAL | 2 refills | Status: AC | PRN

## 2015-12-22 NOTE — Telephone Encounter (Signed)
Done hardcopy to Corinne  

## 2015-12-22 NOTE — Telephone Encounter (Signed)
Rec'd fax from [facility] pt requesting refill on Alprazolam 90 day supply...Johny Chess

## 2015-12-23 NOTE — Telephone Encounter (Signed)
Rx faxed back to [facility]...Johny Chess

## 2015-12-27 ENCOUNTER — Ambulatory Visit (INDEPENDENT_AMBULATORY_CARE_PROVIDER_SITE_OTHER): Payer: 59 | Admitting: Endocrinology

## 2015-12-27 ENCOUNTER — Encounter: Payer: Self-pay | Admitting: Endocrinology

## 2015-12-27 VITALS — BP 112/84 | HR 76 | Ht 64.0 in | Wt 218.0 lb

## 2015-12-27 DIAGNOSIS — Z794 Long term (current) use of insulin: Secondary | ICD-10-CM

## 2015-12-27 DIAGNOSIS — E1165 Type 2 diabetes mellitus with hyperglycemia: Secondary | ICD-10-CM | POA: Diagnosis not present

## 2015-12-27 LAB — COMPREHENSIVE METABOLIC PANEL
ALT: 68 U/L — ABNORMAL HIGH (ref 0–35)
AST: 46 U/L — ABNORMAL HIGH (ref 0–37)
Albumin: 4 g/dL (ref 3.5–5.2)
Alkaline Phosphatase: 121 U/L — ABNORMAL HIGH (ref 39–117)
BUN: 10 mg/dL (ref 6–23)
CO2: 24 mEq/L (ref 19–32)
Calcium: 9.4 mg/dL (ref 8.4–10.5)
Chloride: 103 mEq/L (ref 96–112)
Creatinine, Ser: 0.73 mg/dL (ref 0.40–1.20)
GFR: 88.76 mL/min (ref >60.00)
Glucose, Bld: 209 mg/dL — ABNORMAL HIGH (ref 70–99)
Potassium: 4.2 mEq/L (ref 3.5–5.1)
Sodium: 134 mEq/L — ABNORMAL LOW (ref 135–145)
Total Bilirubin: 0.4 mg/dL (ref 0.2–1.2)
Total Protein: 7.7 g/dL (ref 6.0–8.3)

## 2015-12-27 LAB — POCT GLYCOSYLATED HEMOGLOBIN (HGB A1C): Hemoglobin A1C: 9.3

## 2015-12-27 MED ORDER — CANAGLIFLOZIN 100 MG PO TABS: ORAL_TABLET | ORAL | 3 refills | Status: DC

## 2015-12-27 NOTE — Patient Instructions (Signed)
Check blood sugars on waking up  daily  Also check blood sugars about 2 hours after a meal and do this after different meals by rotation  Recommended blood sugar levels on waking up is 90-130 and about 2 hours after meal is 130-160  Please bring your blood sugar monitor to each visit, thank you  Click [id number] at meals  Walk daily  Invokana   Cut Lisinopril in 1/2

## 2015-12-27 NOTE — Progress Notes (Signed)
Patient ID: ABIE KILLIAN, female   DOB: 08/29/1960, 55 y.o.   MRN: [id number]   Reason for Appointment : Followup for Type 2 Diabetes  History of Present Illness           Diagnosis: Type 2 diabetes mellitus, date of diagnosis: 12/2012         Past history: For 2 months prior to diagnosis the patient had been having increased thirst and urination along with some blurred vision She was seen by her primary care physician and glucose was 593 an A1c of 11.4 Her urine showed small ketones also Because of her high sugars she was started on Levemir insulin. This was subsequently changed to NovoLog 11/94 for simplicity and postprandial control and 8/14.   RECENT history:   INSULIN regimen: V-go, 40 units pump, mealtime clicks: 1--7--4  Her blood sugars recently have been totally out of control with A1c going up to over 10% However despite taking 74 units of insulin with twice a day premixed injections her blood sugars were still markedly increased and prior to starting the V-go pump averaging about 270  Current management, blood sugar patterns and problems:  Her fasting readings are consistently high with her 40 unit basal on the V-go pump and higher than when she first started  She has not followed up as directed and has not called about persistently high readings  She was given Actos in addition on her last visit to help with her hyperglycemia but not benefiting from this  She has had fasting blood sugars as high as 400  She is only doing small amounts of boluses for her meals currently  Since she is checking her sugars fairly sporadically and not on a regular basis not clear what her mealtime coverage needs to be  She has not walked regularly except on vacation recently  She says she will skip her meals if blood sugars are significantly high  Has gained weight  Glucose monitoring:  done about every other  day      Glucometer:   One Touch Verio  Blood Glucose  readings from download    Mean values apply above for all meters except median for One Touch  PRE-MEAL Fasting Lunch Dinner Bedtime Overall  Glucose range: 154-409   184-361  87-306    Mean/median:     204    Self-care: DIET: Inconsistent and not planning meals, On vacation not avoiding regular soft drinks   She has discussed with nurse educator in 2016   Meals:  2-3 meals per day. Exercise: some walking at times                  Wt Readings from Last 3 Encounters:  12/27/15 218 lb (98.9 kg)  09/22/15 213 lb (96.6 kg)  09/08/15 214 lb (97.1 kg)     Lab Results  Component Value Date   HGBA1C 10.4 (H) 08/31/2015   HGBA1C 6.1 07/10/2013   HGBA1C 11.4 (H) 01/03/2013   Lab Results  Component Value Date   LDLCALC  06/05/2007    91        Total Cholesterol/HDL:CHD Risk Coronary Heart Disease Risk Table                     Men   Women  1/2 Average Risk   3.4   3.3  Average Risk       5.0   4.4  2 X Average Risk   9.6  7.1  3 X Average Risk  23.4   11.0        Use the calculated Patient Ratio above and the CHD Risk Table to determine the patient's CHD Risk.        ATP III CLASSIFICATION (LDL):  <100     mg/dL   Optimal  100-129  mg/dL   Near or Above                    Optimal  130-159  mg/dL   Borderline  160-189  mg/dL   High  >190     mg/dL   Very High   CREATININE 0.81 10/08/2015       Medication List       Accurate as of 12/27/15 10:41 AM. Always use your most recent med list.          albuterol 108 (90 Base) MCG/ACT inhaler Commonly known as:  PROVENTIL HFA;VENTOLIN HFA Inhale 2 puffs into the lungs every 6 (six) hours as needed for wheezing or shortness of breath.   ALPRAZolam 0.25 MG tablet Commonly known as:  XANAX Take 1 tablet (0.25 mg total) by mouth 3 (three) times daily as needed for anxiety.   aspirin 81 MG EC tablet Take 81 mg by mouth daily.   canagliflozin 100 MG Tabs tablet Commonly known as:  INVOKANA 1 tablet before breakfast    ezetimibe-simvastatin 10-40 MG tablet Commonly known as:  VYTORIN Take 1 tablet by mouth at bedtime.   fenofibrate 160 MG tablet Take 1 tablet by mouth  daily   Fluticasone-Salmeterol 250-50 MCG/DOSE Aepb Commonly known as:  ADVAIR DISKUS Inhale 1 puff into the lungs 2 (two) times daily.   insulin aspart 100 UNIT/ML injection Commonly known as:  NOVOLOG Use 78 units per day with V-Go pump   Insulin Pen Needle 32G X 4 MM Misc Commonly known as:  BD PEN NEEDLE NANO U/F Use as directed to inject insulin into skin as indicated by your physician.   KOMBIGLYZE XR 2.09-998 MG Tb24 Generic drug:  Saxagliptin-Metformin TAKE 2 TABLETS WITH SUPPER   lisinopril 40 MG tablet Commonly known as:  PRINIVIL,ZESTRIL Take 1 tablet by mouth  daily   ONETOUCH DELICA LANCETS 01U Misc USE TO CHECK BLOOD SUGAR 3 TIMES DAILY   ONETOUCH VERIO test strip Generic drug:  glucose blood CHECK BLOOD SUGARS TWICE A DAY   pioglitazone 15 MG tablet Commonly known as:  ACTOS Take 1 tablet (15 mg total) by mouth daily.   sertraline 100 MG tablet Commonly known as:  ZOLOFT Take 1 tablet by mouth two  times daily   V-GO 40 Kit Use one per day       Allergies:  Allergies  Allergen Reactions  . Amoxicillin Rash    Past Medical History:  Diagnosis Date  . ANXIETY 06/17/2007  . ASTHMA 06/17/2007  . CHEST PAIN 06/17/2007  . DEPRESSION 06/17/2007  . Diabetes mellitus without complication ([place])   . Disc disease, degenerative, lumbar or lumbosacral   . Glucose intolerance (impaired glucose tolerance)   . GOITER, MULTINODULAR 10/22/2008  . Hyperlipidemia 07/13/2011  . HYPERSOMNIA 12/06/2009  . HYPERTENSION 06/17/2007  . HYPERTRIGLYCERIDEMIA 06/17/2007  . Impaired glucose tolerance 11/15/2010  . Left lateral epicondylitis 11/15/2010  . MVP (mitral valve prolapse)   . NASH (nonalcoholic steatohepatitis) 06/08/2011  . OSA (obstructive sleep apnea)   . Right carpal tunnel syndrome 11/15/2010  . Sleep  apnea   . THYROID NODULE, RIGHT 10/19/2008  .  TIA (transient ischemic attack)    2 TIA-20 years ago    Past Surgical History:  Procedure Laterality Date  . BIOPSY BREAST  june 2010   [facility]  . CHOLECYSTECTOMY    . INGUINAL HERNIA REPAIR     1968/1969 BIL  . WISDOM TOOTH EXTRACTION      Family History  Problem Relation Age of Onset  . Hypertension Father   . Diabetes Father   . Coronary artery disease Father   . Hypertension Mother   . Diabetes Mother   . Breast cancer Mother   . Emphysema Mother   . COPD Mother   . Colon polyps Mother   . Alcohol abuse      uncle  . Tuberculosis      aunt  . Thyroid disease Sister     unknown type  . Emphysema Maternal Grandmother     Social History:  reports that she has never smoked. She has never used smokeless tobacco. She reports that she does not drink alcohol or use drugs.    Review of Systems       Lipids: She has increase in triglycerides and has been prescribed Vytorin by PCP, also on fenofibrate. However taking these regularly and lipids are not controlled.    Lab Results  Component Value Date   CHOL 219 (H) 10/08/2015   HDL 35.80 (L) 10/08/2015   LDLCALC  06/05/2007    91        Total Cholesterol/HDL:CHD Risk Coronary Heart Disease Risk Table                     Men   Women  1/2 Average Risk   3.4   3.3  Average Risk       5.0   4.4  2 X Average Risk   9.6   7.1  3 X Average Risk  23.4   11.0        Use the calculated Patient Ratio above and the CHD Risk Table to determine the patient's CHD Risk.        ATP III CLASSIFICATION (LDL):  <100     mg/dL   Optimal  100-129  mg/dL   Near or Above                    Optimal  130-159  mg/dL   Borderline  160-189  mg/dL   High  >190     mg/dL   Very High   LDLDIRECT 128.0 10/08/2015   TRIG 276.0 (H) 10/08/2015   CHOLHDL 6 10/08/2015       She has a history of  depression  treated with Zoloft  Complications: Hepatic steatosis,  Diagnosis initially made in 2012, Has had persistently high liver tests Now on Actos since 4/17   Lab Results  Component Value Date   ALT 60 (H) 10/08/2015    Physical Examination:  BP 112/84 (BP Location: Right Arm, Patient Position: Sitting, Cuff Size: Normal)   Pulse 76   Ht _0  (1.626 m)   Wt 218 lb (98.9 kg)   LMP 06/11/2012   SpO2 97%   BMI 37.42 kg/m      ASSESSMENT:  Diabetes type 2, uncontrolled   See history of present illness for detailed discussion of current diabetes management, blood sugar patterns and problems identified  Although her A1c is slightly better at 9.3 blood sugars are quite out of control with glucose averaging over  200 at home  Her fasting readings are consistently high with her 40 unit basal on the V-go pump and higher than when she first started She has not followed up as directed and has not called about persistently high readings  has not benefited from starting Actos  She is still having mild hyperglycemia even with starting the V-go pump especially fasting, however was taking over 70 units of insulin with injectable regimen previously Blood sugars after meals have been variable but not consistently high  HYPERLIPIDEMIA: Lipids to be rechecked when blood sugars are better controlled  she is somewhat more compliant with her medications now  HYPERTENSION: Fairly well controlled with lisinopril only  Fatty liver: Currently on Actos and needs follow-up  PLAN:  Increase mealtime boluses  using at least 4 clicks at lunch and may need more at breakfast and supper also Extra 1-2 clicks when blood sugars are high and not skip meals Start walking regularly Discussed action of SGLT 2 drugs on lowering glucose by decreasing kidney absorption of glucose, benefits of weight loss and lower blood pressure, possible side effects including candidiasis and dosage regimen   Invokana 100 mg daily   consultation with dietitian Continue Kombiglyze, may  continue Actos if liver functions are better  Discussed timing of glucose monitoring and targets Avoid regular soft drinks  Patient Instructions  Check blood sugars on waking up  daily  Also check blood sugars about 2 hours after a meal and do this after different meals by rotation  Recommended blood sugar levels on waking up is 90-130 and about 2 hours after meal is 130-160  Please bring your blood sugar monitor to each visit, thank you  Click 3--7--3 at meals  Walk daily  Invokana   Cut Lisinopril in 1/2   'Counseling time on subjects discussed above is over 50% of today's 25 minute visit   Robby Bulkley 12/27/2015, 10:41 AM

## 2015-12-27 NOTE — Addendum Note (Signed)
Addended by: Verlin Grills T on: 12/27/2015 04:58 PM   Modules accepted: Orders

## 2016-01-06 ENCOUNTER — Other Ambulatory Visit: Payer: Self-pay | Admitting: Endocrinology

## 2016-01-13 ENCOUNTER — Encounter: Payer: Self-pay | Admitting: Dietician

## 2016-01-13 ENCOUNTER — Encounter: Payer: 59 | Attending: Endocrinology | Admitting: Dietician

## 2016-01-13 DIAGNOSIS — Z713 Dietary counseling and surveillance: Secondary | ICD-10-CM | POA: Insufficient documentation

## 2016-01-13 DIAGNOSIS — E1165 Type 2 diabetes mellitus with hyperglycemia: Secondary | ICD-10-CM | POA: Diagnosis not present

## 2016-01-13 DIAGNOSIS — E118 Type 2 diabetes mellitus with unspecified complications: Secondary | ICD-10-CM

## 2016-01-13 DIAGNOSIS — Z794 Long term (current) use of insulin: Secondary | ICD-10-CM

## 2016-01-13 DIAGNOSIS — IMO0002 Reserved for concepts with insufficient information to code with codable children: Secondary | ICD-10-CM

## 2016-01-13 NOTE — Patient Instructions (Signed)
Continue choosing beverages without carbohydrate. Increase your water intake. Increase your physical activity.  Aim for 30 minutes most days. Try fruits and vegetables. Increase the nutrients in your day.  Aim for 2-3 Carb Choices per meal (30-45 grams) +/- 1 either way  Aim for 0-1 Carbs per snack if hungry  Include protein in moderation with your meals and snacks Consider reading food labels for Total Carbohydrate and Fat Grams of foods Consider checking BG at alternate times per day as directed by MD  Consider taking medication as directed by MD Use a pill box

## 2016-01-13 NOTE — Progress Notes (Signed)
Diabetes Self-Management Education  Visit Type: First/Initial  Appt. Start Time: 1100 Appt. End Time: 1200  01/13/2016  Ms. Sherry White, identified by name and date of birth, is a 55 y.o. female with a diagnosis of Diabetes: Type 2 (2014).  She has been on insulin since diagnosis.  Her blood sugars have remained uncontrolled.  Hx also includes hyperlipidemia, OSA on c-pap, HTN, hypothyroidism, and non-alcoholic fatty liver disease.  Patient lives with her husband.  She does the shopping and cooking.  He is very health conscience and enjoys healthy meals.  Her daughter's boyfriend is "a fitness person" and also encourages her with changes. The only fruits and vegetables that she will eat are apples, bananas, corn, potatoes, and dried beans.  She has not tried most others.  She has a significant textural aversion.  She works as a Scientist, research (physical sciences).  She states that her schedule is better during the school year and she misses meds and meals less.  She reports not eating or only eating protein if her blood sugar is high.  Medications include:  Invokana (she just filled this and has not started it), Kombiglyze XR, Novolig via V-Go (increased to 3-4-4 units with meals), and pioglitazone.  She does forget her medication at times.    ASSESSMENT  Height 5\' 4"  (1.626 m), weight 218 lb (98.9 kg), last menstrual period 06/11/2012. Body mass index is 37.42 kg/m.      Diabetes Self-Management Education - 01/13/16 1115      Visit Information   Visit Type First/Initial     Initial Visit   Diabetes Type Type 2  2014   Are you currently following a meal plan? No   Are you taking your medications as prescribed? Yes   Date Diagnosed 2014 and on insulin since     Health Coping   How would you rate your overall health? Fair     Psychosocial Assessment   Patient Belief/Attitude about Diabetes Other (comment)  all of the above   Self-care barriers None   Self-management support Doctor's  office;Friends;Family   Other persons present Patient   Patient Concerns Glycemic Control;Nutrition/Meal planning;Weight Control   Special Needs None   Preferred Learning Style No preference indicated   Learning Readiness Ready   How often do you need to have someone help you when you read instructions, pamphlets, or other written materials from your doctor or pharmacy? 1 - Never   What is the last grade level you completed in school? 4 years college     Pre-Education Assessment   Patient understands the diabetes disease and treatment process. Needs Review   Patient understands incorporating nutritional management into lifestyle. Needs Review   Patient undertands incorporating physical activity into lifestyle. Needs Review   Patient understands using medications safely. Needs Review   Patient understands monitoring blood glucose, interpreting and using results Needs Review   Patient understands prevention, detection, and treatment of acute complications. Needs Review   Patient understands prevention, detection, and treatment of chronic complications. Demonstrates understanding / competency   Patient understands how to develop strategies to address psychosocial issues. Demonstrates understanding / competency   Patient understands how to develop strategies to promote health/change behavior. Needs Review     Complications   Last HgB A1C per patient/outside source 9.3 %   on 12/27/15 decreased from 10.4% 08/31/15   How often do you check your blood sugar? 1-2 times/day  1-3   Fasting Blood glucose range (mg/dL) 130-179   Postprandial Blood  glucose range (mg/dL) >200;180-200   Number of hypoglycemic episodes per month 1   Can you tell when your blood sugar is low? Yes   What do you do if your blood sugar is low? eats nuts or drinks soda   Number of hyperglycemic episodes per week 7   Can you tell when your blood sugar is high? Yes   What do you do if your blood sugar is high? walks, takes  more insulin   Have you had a dilated eye exam in the past 12 months? Yes   Have you had a dental exam in the past 12 months? No   Are you checking your feet? Yes   How many days per week are you checking your feet? 7     Dietary Intake   Breakfast Plain bagel, diet soda, juice (healthy balance cranberry or vege V-8) or just v-8 or skips   Snack (morning) occasional prezels   Lunch sandwich OR salad and occasional apple  10:30-1   Snack (afternoon) prezels or almonds or rare chocolate bar   Dinner meat, vegetables   Snack (evening) ice cream, cookies, cake, pie or other dessert   Beverage(s) diet soda, healthy balanced cranberry or vege V-8,     Exercise   Exercise Type ADL's   How many days per week to you exercise? 0   How many minutes per day do you exercise? 0   Total minutes per week of exercise 0     Patient Education   Previous Diabetes Education No   Disease state  Definition of diabetes, type 1 and 2, and the diagnosis of diabetes   Nutrition management  Role of diet in the treatment of diabetes and the relationship between the three main macronutrients and blood glucose level;Food label reading, portion sizes and measuring food.;Meal options for control of blood glucose level and chronic complications.   Physical activity and exercise  Role of exercise on diabetes management, blood pressure control and cardiac health.   Monitoring Identified appropriate SMBG and/or A1C goals.;Purpose and frequency of SMBG.;Yearly dilated eye exam;Daily foot exams   Acute complications Taught treatment of hypoglycemia - the 15 rule.   Chronic complications Relationship between chronic complications and blood glucose control;Lipid levels, blood glucose control and heart disease;Dental care;Nephropathy, what it is, prevention of, the use of ACE, ARB's and early detection of through urine microalbumia.;Assessed and discussed foot care and prevention of foot problems;Retinopathy and reason for  yearly dilated eye exams   Psychosocial adjustment Worked with patient to identify barriers to care and solutions;Role of stress on diabetes;Identified and addressed patients feelings and concerns about diabetes   Personal strategies to promote health Lifestyle issues that need to be addressed for better diabetes care     Individualized Goals (developed by patient)   Nutrition General guidelines for healthy choices and portions discussed   Physical Activity Exercise 5-7 days per week;30 minutes per day   Medications take my medication as prescribed   Monitoring  test my blood glucose as discussed   Reducing Risk do foot checks daily;get labs drawn;treat hypoglycemia with 15 grams of carbs if blood glucose less than 70mg /dL   Health Coping discuss diabetes with (comment)  MD, RD, CDE     Post-Education Assessment   Patient understands the diabetes disease and treatment process. Demonstrates understanding / competency   Patient understands incorporating nutritional management into lifestyle. Demonstrates understanding / competency   Patient undertands incorporating physical activity into lifestyle. Demonstrates understanding / competency  Patient understands using medications safely. Demonstrates understanding / competency   Patient understands monitoring blood glucose, interpreting and using results Demonstrates understanding / competency   Patient understands prevention, detection, and treatment of acute complications. Demonstrates understanding / competency   Patient understands prevention, detection, and treatment of chronic complications. Demonstrates understanding / competency   Patient understands how to develop strategies to address psychosocial issues. Demonstrates understanding / competency   Patient understands how to develop strategies to promote health/change behavior. Demonstrates understanding / competency     Outcomes   Expected Outcomes Demonstrated interest in learning.  Expect positive outcomes   Future DMSE PRN   Program Status Completed      Individualized Plan for Diabetes Self-Management Training:   Learning Objective:  Patient will have a greater understanding of diabetes self-management. Patient education plan is to attend individual and/or group sessions per assessed needs and concerns.   Plan:   Patient Instructions  Continue choosing beverages without carbohydrate. Increase your water intake. Increase your physical activity.  Aim for 30 minutes most days. Try fruits and vegetables. Increase the nutrients in your day.  Aim for 2-3 Carb Choices per meal (30-45 grams) +/- 1 either way  Aim for 0-1 Carbs per snack if hungry  Include protein in moderation with your meals and snacks Consider reading food labels for Total Carbohydrate and Fat Grams of foods Consider checking BG at alternate times per day as directed by MD  Consider taking medication as directed by MD Use a pill box  Expected Outcomes:  Demonstrated interest in learning. Expect positive outcomes  Education material provided: Living Well with Diabetes, Food label handouts, A1C conversion sheet, Meal plan card, My Plate and Snack sheet, label reading, breakfast, hypoglycemia symptoms and treatment  If problems or questions, patient to contact team via:  Phone and Email  Future DSME appointment: PRN

## 2016-01-27 ENCOUNTER — Ambulatory Visit: Payer: 59 | Admitting: Endocrinology

## 2016-02-03 ENCOUNTER — Other Ambulatory Visit: Payer: Self-pay | Admitting: Endocrinology

## 2016-03-04 ENCOUNTER — Other Ambulatory Visit: Payer: Self-pay | Admitting: Endocrinology

## 2016-03-23 ENCOUNTER — Other Ambulatory Visit: Payer: Self-pay | Admitting: Endocrinology

## 2016-04-13 ENCOUNTER — Encounter: Payer: Self-pay | Admitting: Internal Medicine

## 2016-04-13 ENCOUNTER — Ambulatory Visit (INDEPENDENT_AMBULATORY_CARE_PROVIDER_SITE_OTHER): Payer: 59 | Admitting: Internal Medicine

## 2016-04-13 VITALS — BP 140/80 | HR 102 | Resp 20 | Wt 220.0 lb

## 2016-04-13 DIAGNOSIS — M25562 Pain in left knee: Secondary | ICD-10-CM | POA: Diagnosis not present

## 2016-04-13 DIAGNOSIS — I10 Essential (primary) hypertension: Secondary | ICD-10-CM | POA: Diagnosis not present

## 2016-04-13 DIAGNOSIS — Z794 Long term (current) use of insulin: Secondary | ICD-10-CM

## 2016-04-13 DIAGNOSIS — E119 Type 2 diabetes mellitus without complications: Secondary | ICD-10-CM | POA: Diagnosis not present

## 2016-04-13 NOTE — Patient Instructions (Signed)
Please continue all other medications as before, and refills have been done if requested.  Please have the pharmacy call with any other refills you may need.  Please keep your appointments with your specialists as you may have planned  You will be contacted regarding the referral for: Dr Smith/sport medicine

## 2016-04-13 NOTE — Progress Notes (Signed)
Pre visit review using our clinic review tool, if applicable. No additional management support is needed unless otherwise documented below in the visit note. 

## 2016-04-15 NOTE — Assessment & Plan Note (Signed)
stable overall by history and exam, recent data reviewed with pt, and pt to continue medical treatment as before,  to f/u any worsening symptoms or concerns BP Readings from Last 3 Encounters:  04/13/16 140/80  12/27/15 112/84  09/22/15 132/82

## 2016-04-15 NOTE — Assessment & Plan Note (Signed)
Not well controlled recently, asympt today o/w stable overall by history and exam, recent data reviewed with pt, and pt declines change in tx,  to f/u any worsening symptoms or concerns Lab Results  Component Value Date   HGBA1C 9.3 12/27/2015

## 2016-04-15 NOTE — Assessment & Plan Note (Signed)
Likely c/w meniscal tear vs djd flare, for pain control, also refer sport medicine for u/s confirmation and tx

## 2016-04-15 NOTE — Progress Notes (Signed)
Subjective:    Patient ID: Sherry White, female    DOB: 1960-06-26, 55 y.o.   MRN: [id number]  HPI  Here with 1 wk onset mod to occas severe sharp left knee pain with swelling, mostly medial but swelling noted of whole knee, now some swelling to more distal left and foot as well but no calf pain or tenderness; occured with twisting the knee to start.  No giveaways or falls. No fever, other trauma or hx of gout.  Pt denies chest pain, increased sob or doe, wheezing, orthopnea, PND, increased LE swelling, palpitations, dizziness or syncope.  Pt denies polydipsia, polyuria, Pt denies new neurological symptoms such as new headache, or facial or extremity weakness or numbness   Past Medical History:  Diagnosis Date  . ANXIETY 06/17/2007  . ASTHMA 06/17/2007  . CHEST PAIN 06/17/2007  . DEPRESSION 06/17/2007  . Diabetes mellitus without complication ([place])   . Disc disease, degenerative, lumbar or lumbosacral   . Glucose intolerance (impaired glucose tolerance)   . GOITER, MULTINODULAR 10/22/2008  . Hyperlipidemia 07/13/2011  . HYPERSOMNIA 12/06/2009  . HYPERTENSION 06/17/2007  . HYPERTRIGLYCERIDEMIA 06/17/2007  . Impaired glucose tolerance 11/15/2010  . Left lateral epicondylitis 11/15/2010  . MVP (mitral valve prolapse)   . NASH (nonalcoholic steatohepatitis) 06/08/2011  . OSA (obstructive sleep apnea)   . Right carpal tunnel syndrome 11/15/2010  . Sleep apnea   . THYROID NODULE, RIGHT 10/19/2008  . TIA (transient ischemic attack)    2 TIA-20 years ago   Past Surgical History:  Procedure Laterality Date  . BIOPSY BREAST  june 2010   [facility]  . CHOLECYSTECTOMY    . INGUINAL HERNIA REPAIR     1968/1969 BIL  . WISDOM TOOTH EXTRACTION      reports that she has never smoked. She has never used smokeless tobacco. She reports that she does not drink alcohol or use drugs. family history includes Breast cancer in her mother; COPD in her mother; Colon polyps in her  mother; Coronary artery disease in her father; Diabetes in her father and mother; Emphysema in her maternal grandmother and mother; Hypertension in her father and mother; Thyroid disease in her sister. Allergies  Allergen Reactions  . Amoxicillin Rash   Current Outpatient Prescriptions on File Prior to Visit  Medication Sig Dispense Refill  . albuterol (PROVENTIL HFA;VENTOLIN HFA) 108 (90 Base) MCG/ACT inhaler Inhale 2 puffs into the lungs every 6 (six) hours as needed for wheezing or shortness of breath. 3 Inhaler 3  . ALPRAZolam (XANAX) 0.25 MG tablet Take 1 tablet (0.25 mg total) by mouth 3 (three) times daily as needed for anxiety. 90 tablet 2  . aspirin 81 MG EC tablet Take 81 mg by mouth daily.      . canagliflozin (INVOKANA) 100 MG TABS tablet 1 tablet before breakfast 30 tablet 3  . ezetimibe-simvastatin (VYTORIN) 10-40 MG tablet Take 1 tablet by mouth at bedtime. 90 tablet 3  . fenofibrate 160 MG tablet Take 1 tablet by mouth  daily 90 tablet 3  . Fluticasone-Salmeterol (ADVAIR DISKUS) 250-50 MCG/DOSE AEPB Inhale 1 puff into the lungs 2 (two) times daily. 1 each 11  . Insulin Disposable Pump (V-GO 40) KIT USE 1 PER DAY 3 kit 0  . Insulin Pen Needle (BD PEN NEEDLE NANO U/F) 32G X 4 MM MISC Use as directed to inject insulin into skin as indicated by your physician. 300 each 1  . KOMBIGLYZE XR 2.09-998 MG TB24 TAKE  2 TABLETS WITH SUPPER 60 tablet 0  . lisinopril (PRINIVIL,ZESTRIL) 40 MG tablet Take 1 tablet by mouth  daily 90 tablet 3  . Multiple Vitamin (MULTIVITAMIN WITH MINERALS) TABS tablet Take 1 tablet by mouth daily.    Marland Kitchen NOVOLOG 100 UNIT/ML injection USE 78 UNITS PER DAY WITH V-GO PUMP 30 mL 3  . ONETOUCH DELICA LANCETS 73V MISC USE TO CHECK BLOOD SUGAR 3 TIMES DAILY 100 each 0  . ONETOUCH VERIO test strip CHECK BLOOD SUGARS TWICE A DAY 300 each 1  . pioglitazone (ACTOS) 15 MG tablet TAKE 1 TABLET (15 MG TOTAL) BY MOUTH DAILY. 30 tablet 1  . sertraline (ZOLOFT) 100 MG tablet Take  1 tablet by mouth two  times daily 180 tablet 3   No current facility-administered medications on file prior to visit.    Review of Systems  Constitutional: Negative for unusual diaphoresis or night sweats HENT: Negative for ear swelling or discharge Eyes: Negative for worsening visual haziness  Respiratory: Negative for choking and stridor.   Gastrointestinal: Negative for distension or worsening eructation Genitourinary: Negative for retention or change in urine volume.  Musculoskeletal: Negative for other MSK pain or swelling Skin: Negative for color change and worsening wound Neurological: Negative for tremors and numbness other than noted  Psychiatric/Behavioral: Negative for decreased concentration or agitation other than above   All other system neg per pt    Objective:   Physical Exam BP 140/80   Pulse (!) 102   Resp 20   Wt 220 lb (99.8 kg)   LMP 06/11/2012   SpO2 97%   BMI 37.76 kg/m  VS noted,  Constitutional: Pt appears in no apparent distress HENT: Head: NCAT.  Right Ear: External ear normal.  Left Ear: External ear normal.  Eyes: . Pupils are equal, round, and reactive to light. Conjunctivae and EOM are normal Neck: Normal range of motion. Neck supple.  Cardiovascular: Normal rate and regular rhythm.   Pulmonary/Chest: Effort normal and breath sounds without rales or wheezing.  Left knee with reduced ROM, diffuse 1+ effusion, and tender over medial joint line Neurological: Pt is alert. Not confused , motor grossly intact Skin: Skin is warm. No rash, no LE edema Psychiatric: Pt behavior is normal. No agitation.  No other new exam findings    Assessment & Plan:

## 2016-04-27 ENCOUNTER — Ambulatory Visit: Payer: 59 | Admitting: Endocrinology

## 2016-05-04 ENCOUNTER — Ambulatory Visit (INDEPENDENT_AMBULATORY_CARE_PROVIDER_SITE_OTHER): Payer: 59 | Admitting: Family Medicine

## 2016-05-04 ENCOUNTER — Ambulatory Visit: Payer: Self-pay

## 2016-05-04 ENCOUNTER — Encounter: Payer: Self-pay | Admitting: Family Medicine

## 2016-05-04 ENCOUNTER — Ambulatory Visit (INDEPENDENT_AMBULATORY_CARE_PROVIDER_SITE_OTHER)
Admission: RE | Admit: 2016-05-04 | Discharge: 2016-05-04 | Disposition: A | Discharge status: Home / Self Care | Payer: 59 | Source: Ambulatory Visit | Attending: Family Medicine | Admitting: Family Medicine

## 2016-05-04 VITALS — BP 140/82 | HR 84 | Ht 64.0 in | Wt 217.4 lb

## 2016-05-04 DIAGNOSIS — M23304 Other meniscus derangements, unspecified medial meniscus, left knee: Secondary | ICD-10-CM | POA: Insufficient documentation

## 2016-05-04 DIAGNOSIS — M25562 Pain in left knee: Secondary | ICD-10-CM

## 2016-05-04 DIAGNOSIS — M1712 Unilateral primary osteoarthritis, left knee: Secondary | ICD-10-CM | POA: Diagnosis not present

## 2016-05-04 NOTE — Assessment & Plan Note (Signed)
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 In addition this patient does have moderate to severe osteophytic changes. Patient was to try home exercises, topical anti-inflammatories, icing protocol. We discussed the potential for bracing as well as formal physical therapy which patient declined at this time. Patient follow-up again in 3-4 weeks and if worsening symptoms we'll consider this as well as injection.

## 2016-05-04 NOTE — Patient Instructions (Signed)
Good to see you.  Happy holidays!  You have arthritis and a meniscal tear Xray downstairs pennsaid pinkie amount topically 2 times daily as needed.  Exercises 3 times a week.  Ice 20 minutes 2 times daily. Usually after activity and before bed. Avoid activity where you pivot and bend knee at the same time.  Vitamin D 2000 IU daily can help with muscle strength and endurance See me again in 4 weeks to makes ure you are doing better

## 2016-05-04 NOTE — Progress Notes (Signed)
[facility] [address], [place] [address] Phone: [phone] Subjective:    I'm seeing this patient by the request  of:  Cathlean Cower, MD   CC: left knee pain x 6 months.   [id number]  Sherry White is a 55 y.o. female coming in with complaint of Left knee pain. Patient states it has been a dull, throbbing aching pain for quite some time now. Patient states that it may have started 6 months ago. Did see someone in urgent care 2 months ago where she was given an injection and had x-rays and was told that she had arthritis. Patient states the pain unfortunate seems to be intermittent. Certain movements causes worsening pain. Sometimes like it wants to lock or give out on her. Sometimes a throbbing pain at night. Does respond somewhat over-the-counter medications. Does not remember any true injury.     Past Medical History:  Diagnosis Date  . ANXIETY 06/17/2007  . ASTHMA 06/17/2007  . CHEST PAIN 06/17/2007  . DEPRESSION 06/17/2007  . Diabetes mellitus without complication ([place])   . Disc disease, degenerative, lumbar or lumbosacral   . Glucose intolerance (impaired glucose tolerance)   . GOITER, MULTINODULAR 10/22/2008  . Hyperlipidemia 07/13/2011  . HYPERSOMNIA 12/06/2009  . HYPERTENSION 06/17/2007  . HYPERTRIGLYCERIDEMIA 06/17/2007  . Impaired glucose tolerance 11/15/2010  . Left lateral epicondylitis 11/15/2010  . MVP (mitral valve prolapse)   . NASH (nonalcoholic steatohepatitis) 06/08/2011  . OSA (obstructive sleep apnea)   . Right carpal tunnel syndrome 11/15/2010  . Sleep apnea   . THYROID NODULE, RIGHT 10/19/2008  . TIA (transient ischemic attack)    2 TIA-20 years ago   Past Surgical History:  Procedure Laterality Date  . BIOPSY BREAST  june 2010   [facility]  . CHOLECYSTECTOMY    . INGUINAL HERNIA REPAIR     1968/1969 BIL  . WISDOM TOOTH EXTRACTION     Social History   Social History  . Marital  status: Married    Spouse name: N/A  . Number of children: 2  . Years of education: N/A   Occupational History  . elem school teacher    Social History Main Topics  . Smoking status: Never Smoker  . Smokeless tobacco: Never Used  . Alcohol use No  . Drug use: No  . Sexual activity: Not Asked   Other Topics Concern  . None   Social History Narrative  . None   Allergies  Allergen Reactions  . Amoxicillin Rash   Family History  Problem Relation Age of Onset  . Hypertension Father   . Diabetes Father   . Coronary artery disease Father   . Hypertension Mother   . Diabetes Mother   . Breast cancer Mother   . Emphysema Mother   . COPD Mother   . Colon polyps Mother   . Thyroid disease Sister     unknown type  . Alcohol abuse      uncle  . Tuberculosis      aunt  . Emphysema Maternal Grandmother     Past medical history, social, surgical and family history all reviewed in electronic medical record.  No pertanent information unless stated regarding to the chief complaint.   Review of Systems:Review of systems updated and as accurate as of 05/04/16  No headache, visual changes, nausea, vomiting, diarrhea, constipation, dizziness, abdominal pain, skin rash, fevers, chills, night sweats, weight loss, swollen lymph nodes, body aches,  joint swelling, muscle aches, chest pain, shortness of breath, mood changes.   Objective  Blood pressure 140/82, pulse 84, height 5\' 4"  (1.626 m), weight 217 lb 6.4 oz (98.6 kg), last menstrual period 06/11/2012. Systems examined below as of 05/04/16   General: No apparent distress alert and oriented x3 mood and affect normal, dressed appropriately.  HEENT: Pupils equal, extraocular movements intact  Respiratory: Patient's speak in full sentences and does not appear short of breath  Cardiovascular: No lower extremity edema, non tender, no erythema  Skin: Warm dry intact with no signs of infection or rash on extremities or on axial skeleton.    Abdomen: Soft nontender  Neuro: Cranial nerves II through XII are intact, neurovascularly intact in all extremities with 2+ DTRs and 2+ pulses.  Lymph: No lymphadenopathy of posterior or anterior cervical chain or axillae bilaterally.  Gait Mild antalgic gait.  MSK:  Non tender with full range of motion and good stability and symmetric strength and tone of shoulders, elbows, wrist, hip and ankles bilaterally.  Knee: Left Mild lateral tilt of the patella fully range of motion. Mild tenderness over the medial joint line Ligaments with solid consistent endpoints including ACL, PCL, LCL, MCL. Positive Mcmurray's, Apley's, and Thessalonian tests. Mild painful patellar compression. Patellar glide with mild crepitus. Patellar and quadriceps tendons unremarkable. Hamstring and quadriceps strength is normal.  Contralateral knee unremarkable  MSK US performed of: Left knee. This study was ordered, performed, and interpreted by Charlann Boxer D.O.  Knee: All structures visualized. Trace effusion of the patellofemoral joint with mild to moderate osteophytic changes of the patellofemoral joint Moderate to severe narrowing of the medial joint line with patient having extrusion of the medial meniscus. Mild hypoechoic changes surrounding the area.  Patellar Tendon unremarkable on long and transverse views without effusion.  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  This included exercises focusing on stretching, strengthening, with significant focus on eccentric aspects.  Given rehab exercises handout for VMO, hip abductors, core, entire kinetic chain including proprioception exercises including cone touches, step downs, hip elevations and turn outs.  Could benefit from PT, regular exercise, upright biking, and a PFS knee brace to assist with tracking abnormalities.    Proper  technique shown and discussed handout in great detail with ATC.  All questions were discussed and answered.       Impression and Recommendations:     This case required medical decision making of moderate complexity.      Note: This dictation was prepared with Dragon dictation along with smaller phrase technology. Any transcriptional errors that result from this process are unintentional.

## 2016-05-05 ENCOUNTER — Other Ambulatory Visit: Payer: Self-pay | Admitting: Endocrinology

## 2016-05-21 ENCOUNTER — Other Ambulatory Visit: Payer: Self-pay | Admitting: Endocrinology

## 2016-05-30 ENCOUNTER — Ambulatory Visit: Payer: 59 | Admitting: Family Medicine

## 2016-06-02 ENCOUNTER — Other Ambulatory Visit: Payer: Self-pay | Admitting: Endocrinology

## 2016-06-12 NOTE — Progress Notes (Signed)
[name] [address] [place], [place] [address] Phone: [phone] Subjective:    I'm seeing this patient by the request  of:  Cathlean Cower, MD   CC: left knee pain f/u  [id number]  Sherry White is a 56 y.o. female coming in with complaint of Left knee pain. 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 Patient was to try conservative therapy including home exercises as well as icing protocol and topical anti-inflammatories. Patient states no significant improvement. States that she is having increasing swelling. Increase instability. Has had the knee lock on her on multiple occasions.    Patient does have x-rays 05/04/2016 that were individually visualized by me. Left knee show no significant bony abnormality including arthritic changes. Past Medical History:  Diagnosis Date  . ANXIETY 06/17/2007  . ASTHMA 06/17/2007  . CHEST PAIN 06/17/2007  . DEPRESSION 06/17/2007  . Diabetes mellitus without complication ([place])   . Disc disease, degenerative, lumbar or lumbosacral   . Glucose intolerance (impaired glucose tolerance)   . GOITER, MULTINODULAR 10/22/2008  . Hyperlipidemia 07/13/2011  . HYPERSOMNIA 12/06/2009  . HYPERTENSION 06/17/2007  . HYPERTRIGLYCERIDEMIA 06/17/2007  . Impaired glucose tolerance 11/15/2010  . Left lateral epicondylitis 11/15/2010  . MVP (mitral valve prolapse)   . NASH (nonalcoholic steatohepatitis) 06/08/2011  . OSA (obstructive sleep apnea)   . Right carpal tunnel syndrome 11/15/2010  . Sleep apnea   . THYROID NODULE, RIGHT 10/19/2008  . TIA (transient ischemic attack)    2 TIA-20 years ago   Past Surgical History:  Procedure Laterality Date  . BIOPSY BREAST  june 2010   [facility]./ [facility]  . CHOLECYSTECTOMY    . INGUINAL HERNIA REPAIR     1968/1969 BIL  . WISDOM TOOTH EXTRACTION     Social History   Social History  . Marital status: Married    Spouse name: N/A  . Number of children: 2   . Years of education: N/A   Occupational History  . elem school teacher    Social History Main Topics  . Smoking status: Never Smoker  . Smokeless tobacco: Never Used  . Alcohol use No  . Drug use: No  . Sexual activity: Not Asked   Other Topics Concern  . None   Social History Narrative  . None   Allergies  Allergen Reactions  . Amoxicillin Rash   Family History  Problem Relation Age of Onset  . Hypertension Father   . Diabetes Father   . Coronary artery disease Father   . Hypertension Mother   . Diabetes Mother   . Breast cancer Mother   . Emphysema Mother   . COPD Mother   . Colon polyps Mother   . Thyroid disease Sister     unknown type  . Alcohol abuse      uncle  . Tuberculosis      aunt  . Emphysema Maternal Grandmother     Past medical history, social, surgical and family history all reviewed in electronic medical record.  No pertanent information unless stated regarding to the chief complaint.   Review of Systems: No headache, visual changes, nausea, vomiting, diarrhea, constipation, dizziness, abdominal pain, skin rash, fevers, chills, night sweats, weight loss, swollen lymph nodes, body aches,, muscle aches, chest pain, shortness of breath, mood changes.   Positive for joint swelling  Objective  Blood pressure 140/82, pulse 87, height 5\' 4"  (1.626 m), weight 216 lb (98 kg), last menstrual period  06/11/2012, SpO2 97 %.   Systems examined below as of 06/13/16 General: NAD A&O x3 mood, affect normal  HEENT: Pupils equal, extraocular movements intact no nystagmus Respiratory: not short of breath at rest or with speaking Cardiovascular: No lower extremity edema, non tender Skin: Warm dry intact with no signs of infection or rash on extremities or on axial skeleton. Abdomen: Soft nontender, no masses Neuro: Cranial nerves  intact, neurovascularly intact in all extremities with 2+ DTRs and 2+ pulses. Lymph: No lymphadenopathy appreciated today  Gait  Antalgic gait noted MSK: Non tender with full range of motion and good stability and symmetric strength and tone of shoulders, elbows, wrist,  hips and ankles bilaterally.   Knee: Left Mild lateral tilt of the patella effusion noted. This is new. Lacking the last 5 of extension and flexion Moderate tenderness to palpation over the medial joint line and the patellofemoral joint Ligaments with solid consistent endpoints including ACL, PCL, LCL, MCL. Positive Mcmurray's, Apley's, and Thessalonian tests. Mild painful patellar compression. Patellar glide with mild crepitus. Patellar and quadriceps tendons unremarkable. Hamstring and quadriceps strength is normal.  Contralateral knee unremarkable Worsening exam.  After informed written and verbal consent, patient was seated on exam table. Left knee was prepped with alcohol swab and utilizing anterolateral approach, patient's left knee space was injected with 4:1  marcaine 0.5%: Kenalog 40mg /dL patient did have aspiration of 13 mL of strawlike fluid with some mild milky appearance. Patient tolerated the procedure well without immediate complications.    Impression and Recommendations:     This case required medical decision making of moderate complexity.      Note: This dictation was prepared with Dragon dictation along with smaller phrase technology. Any transcriptional errors that result from this process are unintentional.

## 2016-06-13 ENCOUNTER — Other Ambulatory Visit: Payer: 59

## 2016-06-13 ENCOUNTER — Encounter: Payer: Self-pay | Admitting: Family Medicine

## 2016-06-13 ENCOUNTER — Ambulatory Visit (INDEPENDENT_AMBULATORY_CARE_PROVIDER_SITE_OTHER): Payer: 59 | Admitting: Family Medicine

## 2016-06-13 VITALS — BP 140/82 | HR 87 | Ht 64.0 in | Wt 216.0 lb

## 2016-06-13 DIAGNOSIS — M23304 Other meniscus derangements, unspecified medial meniscus, left knee: Secondary | ICD-10-CM

## 2016-06-13 DIAGNOSIS — M25462 Effusion, left knee: Secondary | ICD-10-CM

## 2016-06-13 NOTE — Patient Instructions (Addendum)
Good to see you  Sorry you are not better We tried an injection today  Send me a message in 1-2 weeks and we will consider MRI if not better.  I am hoping though you can start the exercises again in 1 week and start to see improvement.  Hod on a appointment for ow until you check in with me next week.

## 2016-06-13 NOTE — Assessment & Plan Note (Signed)
Worsening symptoms today. Patient did have a displaced meniscal tear previously. X-rays do not show any significant arthritis the patient's ultrasound did show moderate narrowing of the medial joint line. Patient is having a locking and giving out on her. We will attempt injection. No significant improvement I do believe that advance imaging is warranted with this affecting daily activities and is increasing her risk of fall. Patient has worsening symptoms we will get an MRI. Patient's has making progress will continue with conservative therapy and patient will follow-up with me again in 3-4 weeks.

## 2016-06-14 LAB — SYNOVIAL CELL COUNT + DIFF, W/ CRYSTALS
Basophils, %: 0 %
Eosinophils-Synovial: 0 % (ref 0–2)
Lymphocytes-Synovial Fld: 11 % (ref 0–74)
Monocyte/Macrophage: 3 % (ref 0–69)
Neutrophil, Synovial: 86 % — ABNORMAL HIGH (ref 0–24)
Synoviocytes, %: 0 % (ref 0–15)
WBC, Synovial: 270 cells/uL — ABNORMAL HIGH (ref <150)

## 2016-06-16 MED ORDER — DOXYCYCLINE HYCLATE 100 MG PO TABS: 100.0000 mg | ORAL_TABLET | Freq: Two times a day (BID) | ORAL | 0 refills | Status: DC

## 2016-06-16 NOTE — Progress Notes (Signed)
Overall

## 2016-07-03 ENCOUNTER — Other Ambulatory Visit: Payer: Self-pay | Admitting: Endocrinology

## 2016-07-05 ENCOUNTER — Ambulatory Visit (INDEPENDENT_AMBULATORY_CARE_PROVIDER_SITE_OTHER): Payer: 59 | Admitting: Endocrinology

## 2016-07-05 ENCOUNTER — Encounter: Payer: Self-pay | Admitting: Endocrinology

## 2016-07-05 VITALS — BP 140/82 | HR 103 | Wt 213.0 lb

## 2016-07-05 DIAGNOSIS — Z794 Long term (current) use of insulin: Secondary | ICD-10-CM

## 2016-07-05 DIAGNOSIS — K760 Fatty (change of) liver, not elsewhere classified: Secondary | ICD-10-CM | POA: Diagnosis not present

## 2016-07-05 DIAGNOSIS — E1165 Type 2 diabetes mellitus with hyperglycemia: Secondary | ICD-10-CM | POA: Diagnosis not present

## 2016-07-05 DIAGNOSIS — E781 Pure hyperglyceridemia: Secondary | ICD-10-CM | POA: Diagnosis not present

## 2016-07-05 LAB — COMPREHENSIVE METABOLIC PANEL
ALT: 42 U/L — ABNORMAL HIGH (ref 0–35)
AST: 26 U/L (ref 0–37)
Albumin: 4.4 g/dL (ref 3.5–5.2)
Alkaline Phosphatase: 104 U/L (ref 39–117)
BUN: 12 mg/dL (ref 6–23)
CO2: 27 mEq/L (ref 19–32)
Calcium: 9.9 mg/dL (ref 8.4–10.5)
Chloride: 104 mEq/L (ref 96–112)
Creatinine, Ser: 0.76 mg/dL (ref 0.40–1.20)
GFR: 84.56 mL/min (ref >60.00)
Glucose, Bld: 145 mg/dL — ABNORMAL HIGH (ref 70–99)
Potassium: 4 mEq/L (ref 3.5–5.1)
Sodium: 138 mEq/L (ref 135–145)
Total Bilirubin: 0.4 mg/dL (ref 0.2–1.2)
Total Protein: 8.1 g/dL (ref 6.0–8.3)

## 2016-07-05 MED ORDER — FREESTYLE LIBRE SENSOR SYSTEM MISC: 1.0000 | 1 refills | Status: DC

## 2016-07-05 MED ORDER — FREESTYLE LIBRE READER DEVI: 1.0000 | 0 refills | Status: DC

## 2016-07-05 NOTE — Progress Notes (Signed)
Patient ID: Sherry White, female   DOB: [birth year], 56 y.o.   MRN: [id number]   Reason for Appointment : Followup for Type 2 Diabetes  History of Present Illness           Diagnosis: Type 2 diabetes mellitus, date of diagnosis: 12/2012         Past history: For 2 months prior to diagnosis the patient had been having increased thirst and urination along with some blurred vision She was seen by her primary care physician and glucose was 593 an A1c of 11.4 Her urine showed small ketones also Because of her high sugars she was started on Levemir insulin. This was subsequently changed to NovoLog 63/78 for simplicity and postprandial control and 8/14.   RECENT history:   INSULIN regimen: V-go, 40 units pump, mealtime clicks: 5--8--8  She has not been seen in follow-up since 7/17 At that time because of her A1c of 10.4 she was started on Invokana 100 mg daily  Current management, blood sugar patterns and problems:  She has been very noncompliant with checking her blood sugars has only 3 readings recently  She thinks that overall she has been watching her diet poorly until recently and most of her sporadic blood sugars in November and December were mostly high  Last night she had a high reading fasting of 175 on Sunday but another couple of readings were close to normal  Office glucose today's 122  Her boluses were increased at lunch and breakfast on the last visit  She has lost a little weight with starting Invokana, previously was gaining weight  She thinks she is using her pump consistently as directed  Glucose monitoring:  done about every other  day      Glucometer:   One Touch Verio  Blood Glucose readings from download as above  Self-care: DIET: Inconsistent and not planning meals, She has discussed diet with nurse educator in 2016   Meals:  2-3 meals per day. Exercise: some walking at times                  Wt Readings from Last 3 Encounters:    07/05/16 213 lb (96.6 kg)  06/13/16 216 lb (98 kg)  05/04/16 217 lb 6.4 oz (98.6 kg)     Lab Results  Component Value Date   HGBA1C 9.3 12/27/2015   HGBA1C 10.4 (H) 08/31/2015   HGBA1C 6.1 07/10/2013   Lab Results  Component Value Date   LDLCALC  06/05/2007    91        Total Cholesterol/HDL:CHD Risk Coronary Heart Disease Risk Table                     Men   Women  1/2 Average Risk   3.4   3.3  Average Risk       5.0   4.4  2 X Average Risk   9.6   7.1  3 X Average Risk  23.4   11.0        Use the calculated Patient Ratio above and the CHD Risk Table to determine the patient's CHD Risk.        ATP III CLASSIFICATION (LDL):  <100     mg/dL   Optimal  100-129  mg/dL   Near or Above                    Optimal  130-159  mg/dL   Borderline  160-189  mg/dL   High  >190     mg/dL   Very High   CREATININE 0.76 07/05/2016     Allergies as of 07/05/2016      Reactions   Amoxicillin Rash      Medication List       Accurate as of 07/05/16  9:38 PM. Always use your most recent med list.          albuterol 108 (90 Base) MCG/ACT inhaler Commonly known as:  PROVENTIL HFA;VENTOLIN HFA Inhale 2 puffs into the lungs every 6 (six) hours as needed for wheezing or shortness of breath.   ALPRAZolam 0.25 MG tablet Commonly known as:  XANAX Take 1 tablet (0.25 mg total) by mouth 3 (three) times daily as needed for anxiety.   aspirin 81 MG EC tablet Take 81 mg by mouth daily.   canagliflozin 100 MG Tabs tablet Commonly known as:  INVOKANA 1 tablet before breakfast   ezetimibe-simvastatin 10-40 MG tablet Commonly known as:  VYTORIN Take 1 tablet by mouth at bedtime.   fenofibrate 160 MG tablet Take 1 tablet by mouth  daily   Fluticasone-Salmeterol 250-50 MCG/DOSE Aepb Commonly known as:  ADVAIR DISKUS Inhale 1 puff into the lungs 2 (two) times daily.   FREESTYLE LIBRE READER Devi 1 Device by Does not apply route every 30 (thirty) days.   FREESTYLE LIBRE SENSOR  SYSTEM Misc 1 Device by Does not apply route every 14 (fourteen) days.   KOMBIGLYZE XR 2.09-998 MG Tb24 Generic drug:  Saxagliptin-Metformin TAKE 2 TABLETS WITH SUPPER   lisinopril 40 MG tablet Commonly known as:  PRINIVIL,ZESTRIL Take 1 tablet by mouth  daily   multivitamin with minerals Tabs tablet Take 1 tablet by mouth daily.   NOVOLOG 100 UNIT/ML injection Generic drug:  insulin aspart USE 78 UNITS PER DAY WITH V-GO PUMP   ONETOUCH DELICA LANCETS 82X Misc USE TO CHECK BLOOD SUGAR 3 TIMES DAILY   ONETOUCH VERIO test strip Generic drug:  glucose blood CHECK BLOOD SUGARS TWICE A DAY   pioglitazone 15 MG tablet Commonly known as:  ACTOS TAKE 1 TABLET BY MOUTH EVERY DAY   sertraline 100 MG tablet Commonly known as:  ZOLOFT Take 1 tablet by mouth two  times daily   V-GO 40 Kit USE 1 PER DAY       Allergies:  Allergies  Allergen Reactions  . Amoxicillin Rash    Past Medical History:  Diagnosis Date  . ANXIETY 06/17/2007  . ASTHMA 06/17/2007  . CHEST PAIN 06/17/2007  . DEPRESSION 06/17/2007  . Diabetes mellitus without complication ([place])   . Disc disease, degenerative, lumbar or lumbosacral   . Glucose intolerance (impaired glucose tolerance)   . GOITER, MULTINODULAR 10/22/2008  . Hyperlipidemia 07/13/2011  . HYPERSOMNIA 12/06/2009  . HYPERTENSION 06/17/2007  . HYPERTRIGLYCERIDEMIA 06/17/2007  . Impaired glucose tolerance 11/15/2010  . Left lateral epicondylitis 11/15/2010  . MVP (mitral valve prolapse)   . NASH (nonalcoholic steatohepatitis) 06/08/2011  . OSA (obstructive sleep apnea)   . Right carpal tunnel syndrome 11/15/2010  . Sleep apnea   . THYROID NODULE, RIGHT 10/19/2008  . TIA (transient ischemic attack)    2 TIA-20 years ago    Past Surgical History:  Procedure Laterality Date  . BIOPSY BREAST  june 2010   [facility]./ [facility]  . CHOLECYSTECTOMY    . INGUINAL HERNIA REPAIR     1968/1969 BIL  . WISDOM TOOTH EXTRACTION       Family  History  Problem Relation Age of Onset  . Hypertension Father   . Diabetes Father   . Coronary artery disease Father   . Hypertension Mother   . Diabetes Mother   . Breast cancer Mother   . Emphysema Mother   . COPD Mother   . Colon polyps Mother   . Thyroid disease Sister     unknown type  . Alcohol abuse      uncle  . Tuberculosis      aunt  . Emphysema Maternal Grandmother     Social History:  reports that she has never smoked. She has never used smokeless tobacco. She reports that she does not drink alcohol or use drugs.    Review of Systems       Lipids: She has increase in triglycerides and has been prescribed Vytorin by PCP, also on fenofibrate.  Has not been regular with these   Lab Results  Component Value Date   CHOL 219 (H) 10/08/2015   HDL 35.80 (L) 10/08/2015   LDLCALC  06/05/2007    91        Total Cholesterol/HDL:CHD Risk Coronary Heart Disease Risk Table                     Men   Women  1/2 Average Risk   3.4   3.3  Average Risk       5.0   4.4  2 X Average Risk   9.6   7.1  3 X Average Risk  23.4   11.0        Use the calculated Patient Ratio above and the CHD Risk Table to determine the patient's CHD Risk.        ATP III CLASSIFICATION (LDL):  <100     mg/dL   Optimal  100-129  mg/dL   Near or Above                    Optimal  130-159  mg/dL   Borderline  160-189  mg/dL   High  >190     mg/dL   Very High   LDLDIRECT 128.0 10/08/2015   TRIG 276.0 (H) 10/08/2015   CHOLHDL 6 10/08/2015       She has a history of  depression  treated with Zoloft  Complications: Hepatic steatosis, Diagnosis initially made in 2012, Has had persistently high liver tests She is on Actos since 4/17   Lab Results  Component Value Date   ALT 42 (H) 07/05/2016   Less active recently because of left meniscus tear   Physical Examination:  BP 140/82   Pulse (!) 103   Wt 213 lb (96.6 kg)   LMP 06/11/2012   SpO2 97%   BMI 36.56 kg/m       Diabetic Foot Exam - Simple   Simple Foot Form Diabetic Foot exam was performed with the following findings:  Yes   Visual Inspection No deformities, no ulcerations, no other skin breakdown bilaterally:  Yes Sensation Testing Intact to touch and monofilament testing bilaterally:  Yes Pulse Check Posterior Tibialis and Dorsalis pulse intact bilaterally:  Yes Comments     ASSESSMENT:  Diabetes type 2, uncontrolled   See history of present illness for detailed discussion of current diabetes management, blood sugar patterns and problems identified  Although her A1c is  better Compared to previous level of over 10% she has only a few blood sugars today She may have benefited from [place]  but only recently her blood sugars appear to be improving Her main difficulty is not watching her diet Also very reluctant to monitor blood sugars consistently Has minimal physical activity also especially with her recent knee problem  HYPERLIPIDEMIA: Lipids to be rechecked fasting when blood sugars are better controlled Reminded her to be compliant with her medications  HYPERTENSION: Fairly well controlled with lisinopril, no change with adding Invokana  Fatty liver: Currently on Actos and needs follow-up liver tests  PLAN:   Increase Invokana to 200 mg and prescription will be for 300.  Since she will not monitor blood sugars by fingerstick she will try to get the freestyle Libre sensor; discussed this technique and showed her how it would be done.  She will start doing this and advised her to change the sensor every 10 days and bring the reader on each visit.  Meals need to be low fat and she needs to consistently avoid her regular soft drinks  Restart exercise when able to  No change in insulin regimen as yet  Discussed that she will need to adjust her boluses based on blood sugar patterns  Check renal and hepatic function today  Patient Instructions  Invokana 2 daily  Try  Freestyle sensor  'Counseling time on subjects discussed above is over 50% of today's 25 minute visit   Velencia Lenart 07/05/2016, 9:38 PM

## 2016-07-05 NOTE — Patient Instructions (Addendum)
Invokana 2 daily  Try Freestyle sensor

## 2016-07-06 LAB — POCT GLYCOSYLATED HEMOGLOBIN (HGB A1C): Hemoglobin A1C: 7.7

## 2016-07-06 NOTE — Addendum Note (Signed)
Addended by: Inocencio Homes on: 07/06/2016 10:33 AM   Modules accepted: Orders

## 2016-08-02 ENCOUNTER — Other Ambulatory Visit (INDEPENDENT_AMBULATORY_CARE_PROVIDER_SITE_OTHER): Payer: 59

## 2016-08-02 DIAGNOSIS — Z794 Long term (current) use of insulin: Secondary | ICD-10-CM

## 2016-08-02 DIAGNOSIS — E1165 Type 2 diabetes mellitus with hyperglycemia: Secondary | ICD-10-CM

## 2016-08-02 LAB — LDL CHOLESTEROL, DIRECT: Direct LDL: 133 mg/dL

## 2016-08-02 LAB — LIPID PANEL
Cholesterol: 257 mg/dL — ABNORMAL HIGH (ref 0–200)
HDL: 38.4 mg/dL — ABNORMAL LOW (ref >39.00)
NonHDL: 218.7
Total CHOL/HDL Ratio: 7
Triglycerides: 349 mg/dL — ABNORMAL HIGH (ref 0.0–149.0)
VLDL: 69.8 mg/dL — ABNORMAL HIGH (ref 0.0–40.0)

## 2016-08-02 LAB — GLUCOSE, RANDOM: Glucose, Bld: 198 mg/dL — ABNORMAL HIGH (ref 70–99)

## 2016-08-03 LAB — FRUCTOSAMINE: Fructosamine: 293 umol/L — ABNORMAL HIGH (ref 0–285)

## 2016-08-04 ENCOUNTER — Other Ambulatory Visit: Payer: Self-pay | Admitting: Endocrinology

## 2016-08-07 ENCOUNTER — Ambulatory Visit: Payer: 59 | Admitting: Endocrinology

## 2016-08-27 ENCOUNTER — Other Ambulatory Visit: Payer: Self-pay | Admitting: Endocrinology

## 2016-09-01 ENCOUNTER — Other Ambulatory Visit: Payer: Self-pay | Admitting: Endocrinology

## 2016-09-08 ENCOUNTER — Encounter: Payer: 59 | Admitting: Internal Medicine

## 2016-09-28 ENCOUNTER — Other Ambulatory Visit: Payer: Self-pay | Admitting: Internal Medicine

## 2016-10-06 ENCOUNTER — Other Ambulatory Visit: Payer: Self-pay | Admitting: Endocrinology

## 2016-10-30 ENCOUNTER — Other Ambulatory Visit: Payer: Self-pay | Admitting: Endocrinology

## 2016-11-28 ENCOUNTER — Other Ambulatory Visit: Payer: Self-pay | Admitting: Endocrinology

## 2016-12-11 ENCOUNTER — Other Ambulatory Visit: Payer: Self-pay | Admitting: Endocrinology

## 2016-12-25 ENCOUNTER — Other Ambulatory Visit: Payer: Self-pay | Admitting: Endocrinology

## 2016-12-25 ENCOUNTER — Other Ambulatory Visit: Payer: Self-pay | Admitting: Internal Medicine

## 2016-12-25 LAB — HM DIABETES EYE EXAM

## 2017-01-05 ENCOUNTER — Other Ambulatory Visit: Payer: Self-pay | Admitting: Endocrinology

## 2017-01-17 ENCOUNTER — Other Ambulatory Visit: Payer: Self-pay | Admitting: Endocrinology

## 2017-01-17 DIAGNOSIS — E1165 Type 2 diabetes mellitus with hyperglycemia: Secondary | ICD-10-CM

## 2017-01-17 DIAGNOSIS — Z794 Long term (current) use of insulin: Principal | ICD-10-CM

## 2017-01-19 ENCOUNTER — Other Ambulatory Visit (INDEPENDENT_AMBULATORY_CARE_PROVIDER_SITE_OTHER): Payer: 59

## 2017-01-19 DIAGNOSIS — Z794 Long term (current) use of insulin: Secondary | ICD-10-CM | POA: Diagnosis not present

## 2017-01-19 DIAGNOSIS — E1165 Type 2 diabetes mellitus with hyperglycemia: Secondary | ICD-10-CM

## 2017-01-19 LAB — COMPREHENSIVE METABOLIC PANEL
ALT: 48 U/L — ABNORMAL HIGH (ref 0–35)
AST: 31 U/L (ref 0–37)
Albumin: 4.1 g/dL (ref 3.5–5.2)
Alkaline Phosphatase: 106 U/L (ref 39–117)
BUN: 19 mg/dL (ref 6–23)
CO2: 27 mEq/L (ref 19–32)
Calcium: 9.9 mg/dL (ref 8.4–10.5)
Chloride: 104 mEq/L (ref 96–112)
Creatinine, Ser: 0.77 mg/dL (ref 0.40–1.20)
GFR: 83.12 mL/min (ref >60.00)
Glucose, Bld: 170 mg/dL — ABNORMAL HIGH (ref 70–99)
Potassium: 4.4 mEq/L (ref 3.5–5.1)
Sodium: 136 mEq/L (ref 135–145)
Total Bilirubin: 0.4 mg/dL (ref 0.2–1.2)
Total Protein: 7.6 g/dL (ref 6.0–8.3)

## 2017-01-19 LAB — MICROALBUMIN / CREATININE URINE RATIO
Creatinine,U: 69.9 mg/dL
Microalb Creat Ratio: 4.3 mg/g (ref 0.0–30.0)
Microalb, Ur: 3 mg/dL — ABNORMAL HIGH (ref 0.0–1.9)

## 2017-01-19 LAB — LIPID PANEL
Cholesterol: 225 mg/dL — ABNORMAL HIGH (ref 0–200)
HDL: 35.6 mg/dL — ABNORMAL LOW (ref >39.00)
Total CHOL/HDL Ratio: 6
Triglycerides: 411 mg/dL — ABNORMAL HIGH (ref 0.0–149.0)

## 2017-01-19 LAB — HEMOGLOBIN A1C: Hgb A1c MFr Bld: 8.7 % — ABNORMAL HIGH (ref 4.6–6.5)

## 2017-01-19 LAB — LDL CHOLESTEROL, DIRECT: Direct LDL: 122 mg/dL

## 2017-01-23 ENCOUNTER — Ambulatory Visit: Payer: 59 | Admitting: Endocrinology

## 2017-01-29 ENCOUNTER — Other Ambulatory Visit: Payer: Self-pay | Admitting: Endocrinology

## 2017-02-09 ENCOUNTER — Other Ambulatory Visit: Payer: Self-pay | Admitting: Endocrinology

## 2017-02-23 ENCOUNTER — Other Ambulatory Visit: Payer: Self-pay | Admitting: Endocrinology

## 2017-02-28 ENCOUNTER — Encounter: Payer: Self-pay | Admitting: Endocrinology

## 2017-02-28 ENCOUNTER — Ambulatory Visit (INDEPENDENT_AMBULATORY_CARE_PROVIDER_SITE_OTHER): Payer: 59 | Admitting: Endocrinology

## 2017-02-28 VITALS — BP 144/100 | HR 95 | Wt 217.0 lb

## 2017-02-28 DIAGNOSIS — I10 Essential (primary) hypertension: Secondary | ICD-10-CM

## 2017-02-28 DIAGNOSIS — Z794 Long term (current) use of insulin: Secondary | ICD-10-CM | POA: Diagnosis not present

## 2017-02-28 DIAGNOSIS — E781 Pure hyperglyceridemia: Secondary | ICD-10-CM

## 2017-02-28 DIAGNOSIS — E1165 Type 2 diabetes mellitus with hyperglycemia: Secondary | ICD-10-CM

## 2017-02-28 MED ORDER — DULAGLUTIDE 0.75 MG/0.5ML ~~LOC~~ SOAJ: SUBCUTANEOUS | 0 refills | Status: DC

## 2017-02-28 NOTE — Patient Instructions (Addendum)
Check sugar 4x daily  Start TRULICITYwith the pen as shown once weekly on the same day of the week.   You may inject in the stomach, thigh or arm as indicated in the brochure given.  You will feel fullness of the stomach with starting the medication and should try to keep the portions at meals small.  You may experience nausea in the first few days which usually gets better over time    If any questions or concerns are present call the office or the  [place] at [phone]. Also visit [email or web address] website for more useful information

## 2017-02-28 NOTE — Progress Notes (Signed)
Patient ID: Sherry White, female   DOB: September 15, 1960, 56 y.o.   MRN: [id number]   Reason for Appointment : Followup for Type 2 Diabetes  History of Present Illness           Diagnosis: Type 2 diabetes mellitus, date of diagnosis: 12/2012         Past history: For 2 months prior to diagnosis the patient had been having increased thirst and urination along with some blurred vision She was seen by her primary care physician and glucose was 593 an A1c of 11.4 Her urine showed small ketones also Because of her high sugars she was started on Levemir insulin. This was subsequently changed to NovoLog 24/23 for simplicity and postprandial control and 8/14.   RECENT history:   INSULIN regimen: V-go, 40 units pump, mealtime clicks: 5--3--6  She has not been seen in follow-up since 2/18  Although her A1c was much better at 7.7 in February it is now up to 8.7 as of 8/18  Current management, blood sugar patterns and problems:  Although she has been using the freestyle Libre sensor more recently she has checked her readings mostly in the mornings and midday and unable to get a pattern of her blood sugars for the last 2 weeks throughout the day  Generally appears to have significant increase in her blood sugars late in the evenings and overnight with gradual improvement until about late morning  However FASTING blood sugars are still higher than normal consistently  A few weeks ago she was concerned about the possible amputation side effects from [facility] and she stopped taking this  Also on her own she has stopped taking her Kombiglyze XR and Actos  She does however change her V-go pump every morning and is taking her bolus clicks as before  She does not change her boluses based on what she is eating or her blood sugar level  She has not been exercising recently also  As before she has difficulty keeping portions and snacks controlled and her weight is starting to go back up  again despite higher blood sugars  She has not used her fingerstick for glucose monitoring at all  Glucose monitoring:  done about every other  day      Glucometer:   freestyle Libre   Blood Glucose readings from download  Mean values apply above for all meters except median for One Touch  PRE-MEAL Fasting Lunch Dinner Bedtime Overall  Glucose range:          Mean/median: 240  203  215  292 255      Self-care: DIET: Inconsistent and not planning meals, She has discussed diet with nurse educator in 2016   Meals:  2-3 meals per day.  Exercise: no walking now                Wt Readings from Last 3 Encounters:  02/28/17 217 lb (98.4 kg)  07/05/16 213 lb (96.6 kg)  06/13/16 216 lb (98 kg)     Lab Results  Component Value Date   HGBA1C 8.7 (H) 01/19/2017   HGBA1C 7.7 07/06/2016   HGBA1C 9.3 12/27/2015   Lab Results  Component Value Date   MICROALBUR 3.0 (H) 01/19/2017   LDLCALC  06/05/2007    91        Total Cholesterol/HDL:CHD Risk Coronary Heart Disease Risk Table  Men   Women  1/2 Average Risk   3.4   3.3  Average Risk       5.0   4.4  2 X Average Risk   9.6   7.1  3 X Average Risk  23.4   11.0        Use the calculated Patient Ratio above and the CHD Risk Table to determine the patient's CHD Risk.        ATP III CLASSIFICATION (LDL):  <100     mg/dL   Optimal  100-129  mg/dL   Near or Above                    Optimal  130-159  mg/dL   Borderline  160-189  mg/dL   High  >190     mg/dL   Very High   CREATININE 0.77 01/19/2017     Allergies as of 02/28/2017      Reactions   Amoxicillin Rash      Medication List       Accurate as of 02/28/17  9:09 PM. Always use your most recent med list.          aspirin 81 MG EC tablet Take 81 mg by mouth daily.   Dulaglutide 0.75 MG/0.5ML Sopn Commonly known as:  TRULICITY Inject in the abdominal skin as directed once a week   ezetimibe-simvastatin 10-40 MG tablet Commonly known as:   VYTORIN Take 1 tablet by mouth at bedtime.   fenofibrate 160 MG tablet Take 1 tablet by mouth  daily   Fluticasone-Salmeterol 250-50 MCG/DOSE Aepb Commonly known as:  ADVAIR DISKUS Inhale 1 puff into the lungs 2 (two) times daily.   FREESTYLE LIBRE READER Devi 1 Device by Does not apply route every 30 (thirty) days.   FREESTYLE LIBRE SENSOR SYSTEM Misc USE TO TEST BLOOD GLUCOSE EVERY 10 DAYS AS DIRECTED   INVOKANA 100 MG Tabs tablet Generic drug:  canagliflozin TAKE 1 TABLET BY MOUTH EVERY DAY BEFORE BREAKFAST   KOMBIGLYZE XR 2.09-998 MG Tb24 Generic drug:  Saxagliptin-Metformin TAKE 2 TABLETS WITH SUPPER   lisinopril 40 MG tablet Commonly known as:  PRINIVIL,ZESTRIL Take 1 tablet by mouth  daily   multivitamin with minerals Tabs tablet Take 1 tablet by mouth daily.   NOVOLOG 100 UNIT/ML injection Generic drug:  insulin aspart USE 78 UNITS PER DAY WITH V-GO PUMP   ONETOUCH DELICA LANCETS 84O Misc USE TO CHECK BLOOD SUGAR 3 TIMES DAILY   ONETOUCH VERIO test strip Generic drug:  glucose blood CHECK BLOOD SUGARS TWICE A DAY   pioglitazone 15 MG tablet Commonly known as:  ACTOS TAKE 1 TABLET BY MOUTH EVERY DAY   PROAIR HFA 108 (90 Base) MCG/ACT inhaler Generic drug:  albuterol INHALE 2 PUFFS INTO THE LUNGS EVERY 6 (SIX) HOURS AS NEEDED FOR WHEEZING OR SHORTNESS OF BREATH.   sertraline 100 MG tablet Commonly known as:  ZOLOFT TAKE 1 TABLET BY MOUTH TWO TIMES DAILY   V-GO 40 Kit USE 1 PER DAY   XANAX PO Take 0.25 mg by mouth as needed.       Allergies:  Allergies  Allergen Reactions  . Amoxicillin Rash    Past Medical History:  Diagnosis Date  . ANXIETY 06/17/2007  . ASTHMA 06/17/2007  . CHEST PAIN 06/17/2007  . DEPRESSION 06/17/2007  . Diabetes mellitus without complication ([place])   . Disc disease, degenerative, lumbar or lumbosacral   . Glucose intolerance (impaired glucose tolerance)   .  GOITER, MULTINODULAR 10/22/2008  . Hyperlipidemia 07/13/2011    . HYPERSOMNIA 12/06/2009  . HYPERTENSION 06/17/2007  . HYPERTRIGLYCERIDEMIA 06/17/2007  . Impaired glucose tolerance 11/15/2010  . Left lateral epicondylitis 11/15/2010  . MVP (mitral valve prolapse)   . NASH (nonalcoholic steatohepatitis) 06/08/2011  . OSA (obstructive sleep apnea)   . Right carpal tunnel syndrome 11/15/2010  . Sleep apnea   . THYROID NODULE, RIGHT 10/19/2008  . TIA (transient ischemic attack)    2 TIA-20 years ago    Past Surgical History:  Procedure Laterality Date  . BIOPSY BREAST  june 2010   [facility]  . CHOLECYSTECTOMY    . INGUINAL HERNIA REPAIR     1968/1969 BIL  . WISDOM TOOTH EXTRACTION      Family History  Problem Relation Age of Onset  . Hypertension Father   . Diabetes Father   . Coronary artery disease Father   . Hypertension Mother   . Diabetes Mother   . Breast cancer Mother   . Emphysema Mother   . COPD Mother   . Colon polyps Mother   . Thyroid disease Sister        unknown type  . Alcohol abuse Unknown        uncle  . Tuberculosis Unknown        aunt  . Emphysema Maternal Grandmother     Social History:  reports that she has never smoked. She has never used smokeless tobacco. She reports that she does not drink alcohol or use drugs.    Review of Systems       Lipids: She has increase in triglycerides and has been prescribed Vytorin by PCP, also on fenofibrate.  Has not been regular with these and has not taken these at all   Lab Results  Component Value Date   CHOL 225 (H) 01/19/2017   HDL 35.60 (L) 01/19/2017   LDLCALC  06/05/2007    91        Total Cholesterol/HDL:CHD Risk Coronary Heart Disease Risk Table                     Men   Women  1/2 Average Risk   3.4   3.3  Average Risk       5.0   4.4  2 X Average Risk   9.6   7.1  3 X Average Risk  23.4   11.0        Use the calculated Patient Ratio above and the CHD Risk Table to determine the patient's CHD Risk.        ATP III  CLASSIFICATION (LDL):  <100     mg/dL   Optimal  100-129  mg/dL   Near or Above                    Optimal  130-159  mg/dL   Borderline  160-189  mg/dL   High  >190     mg/dL   Very High   LDLDIRECT 122.0 01/19/2017   TRIG (H) 01/19/2017    411.0 Triglyceride is over 400; calculations on Lipids are invalid.   CHOLHDL 6 01/19/2017       She has a history of  depression  treated with Zoloft, Not taking this now  Complications: Hepatic steatosis, Diagnosis initially made in 2012, Has had mild increase in liver functions recently She is on Actos since 4/17   Lab Results  Component Value Date  ALT 48 (H) 01/19/2017     Physical Examination:  BP (!) 144/100 (BP Location: Left Arm, Patient Position: Sitting)   Pulse 95   Wt 217 lb (98.4 kg)   LMP 06/11/2012   SpO2 98%   BMI 37.25 kg/m     ASSESSMENT:  Diabetes type 2, uncontrolled   See history of present illness for detailed discussion of current diabetes management, blood sugar patterns and problems identified  Her blood sugars are totally out of control now with average blood sugar at home around 250 She is not motivated to manage her diabetes well or take any of her medications except her insulin Blood sugars are probably worse recently and A1c about 6 weeks ago was 8.7 However when she is better compliant her A1c has been below 8% also She also can do better with diet and exercise  HYPERLIPIDEMIA: Lipids poorly controlled for various reasons including lack of medications, dietary factors and hyperglycemia  HYPERTENSION: Blood pressure is much higher with not taking Invokana or lisinopril  Fatty liver: Currently on Actos but ALT is starting to go up again  PLAN:   RESTART Invokana, discussed the benefit versus risk ratio and discussed the epidemiological study showing no increase in amputations, explained to her the benefits of using this in short-term and long-term  Since she also needs weight loss, better  compliance with diet and portion control she is a good candidate for GLP-1 drug Discussed with the patient the nature of GLP-1 drugs, the action on various organ systems, how they benefit blood glucose control, as well as the benefit of weight loss and  increase satiety . Explained possible side effects, particularly nausea and vomiting that usually resolve over time; discussed safety information in package insert. Demonstrated the medication injection device and injection technique to the patient.  Showed patient where to inject the medication. To start with TRULICITY 3.84 mg dosage weekly for the first 4 weeks Patient brochure on Trulicity with enclosed co-pay card given  Improve diet  Restart regular walking  Resume all her other medications including lisinopril  Consider consultation with dietitian and nurse educator also  Patient Instructions  Check sugar 4x daily  Start TRULICITYwith the pen as shown once weekly on the same day of the week.   You may inject in the stomach, thigh or arm as indicated in the brochure given.  You will feel fullness of the stomach with starting the medication and should try to keep the portions at meals small.  You may experience nausea in the first few days which usually gets better over time    If any questions or concerns are present call the office or the  [place] at [phone]. Also visit [email or web address] website for more useful information     Counseling time on subjects discussed in assessment and plan sections is over 50% of today's 25 minute visit   Carsyn Boster 02/28/2017, 9:09 PM

## 2017-03-06 ENCOUNTER — Other Ambulatory Visit: Payer: Self-pay | Admitting: Endocrinology

## 2017-03-17 ENCOUNTER — Other Ambulatory Visit: Payer: Self-pay | Admitting: Endocrinology

## 2017-03-24 ENCOUNTER — Other Ambulatory Visit: Payer: Self-pay | Admitting: Endocrinology

## 2017-03-28 ENCOUNTER — Other Ambulatory Visit: Payer: Self-pay | Admitting: Internal Medicine

## 2017-03-28 ENCOUNTER — Other Ambulatory Visit: Payer: Self-pay | Admitting: Endocrinology

## 2017-04-04 ENCOUNTER — Other Ambulatory Visit: Payer: 59

## 2017-04-09 ENCOUNTER — Ambulatory Visit: Payer: 59 | Admitting: Endocrinology

## 2017-04-20 ENCOUNTER — Other Ambulatory Visit: Payer: Self-pay | Admitting: Internal Medicine

## 2017-04-20 ENCOUNTER — Other Ambulatory Visit: Payer: Self-pay | Admitting: Endocrinology

## 2017-04-28 ENCOUNTER — Other Ambulatory Visit: Payer: Self-pay | Admitting: Endocrinology

## 2017-05-20 ENCOUNTER — Other Ambulatory Visit: Payer: Self-pay | Admitting: Endocrinology

## 2017-05-20 ENCOUNTER — Other Ambulatory Visit: Payer: Self-pay | Admitting: Internal Medicine

## 2017-05-21 ENCOUNTER — Other Ambulatory Visit: Payer: Self-pay | Admitting: Endocrinology

## 2017-05-23 NOTE — Telephone Encounter (Signed)
zoloft done erx, 1 mo only  Pt due for ROV please

## 2017-06-08 ENCOUNTER — Other Ambulatory Visit (INDEPENDENT_AMBULATORY_CARE_PROVIDER_SITE_OTHER): Payer: 59

## 2017-06-08 DIAGNOSIS — E1165 Type 2 diabetes mellitus with hyperglycemia: Secondary | ICD-10-CM | POA: Diagnosis not present

## 2017-06-08 DIAGNOSIS — Z794 Long term (current) use of insulin: Secondary | ICD-10-CM | POA: Diagnosis not present

## 2017-06-08 LAB — BASIC METABOLIC PANEL
BUN: 11 mg/dL (ref 6–23)
CO2: 27 mEq/L (ref 19–32)
Calcium: 9.2 mg/dL (ref 8.4–10.5)
Chloride: 103 mEq/L (ref 96–112)
Creatinine, Ser: 0.81 mg/dL (ref 0.40–1.20)
GFR: 78.29 mL/min (ref >60.00)
Glucose, Bld: 154 mg/dL — ABNORMAL HIGH (ref 70–99)
Potassium: 3.9 mEq/L (ref 3.5–5.1)
Sodium: 137 mEq/L (ref 135–145)

## 2017-06-09 LAB — FRUCTOSAMINE: Fructosamine: 255 umol/L (ref 0–285)

## 2017-06-13 ENCOUNTER — Encounter: Payer: Self-pay | Admitting: Endocrinology

## 2017-06-13 ENCOUNTER — Other Ambulatory Visit: Payer: Self-pay

## 2017-06-13 MED ORDER — INSULIN LISPRO 100 UNIT/ML ~~LOC~~ SOLN: SUBCUTANEOUS | 4 refills | Status: DC

## 2017-06-18 ENCOUNTER — Encounter: Payer: Self-pay | Admitting: Endocrinology

## 2017-06-18 ENCOUNTER — Ambulatory Visit: Payer: 59 | Admitting: Endocrinology

## 2017-06-18 VITALS — BP 146/98 | HR 95 | Ht 64.0 in | Wt 215.0 lb

## 2017-06-18 DIAGNOSIS — E781 Pure hyperglyceridemia: Secondary | ICD-10-CM | POA: Diagnosis not present

## 2017-06-18 DIAGNOSIS — Z794 Long term (current) use of insulin: Secondary | ICD-10-CM

## 2017-06-18 DIAGNOSIS — E1165 Type 2 diabetes mellitus with hyperglycemia: Secondary | ICD-10-CM | POA: Diagnosis not present

## 2017-06-18 DIAGNOSIS — I10 Essential (primary) hypertension: Secondary | ICD-10-CM | POA: Diagnosis not present

## 2017-06-18 MED ORDER — TRIAMTERENE-HCTZ 37.5-25 MG PO TABS: 0.5000 | ORAL_TABLET | Freq: Every day | ORAL | 3 refills | Status: DC

## 2017-06-18 MED ORDER — PIOGLITAZONE HCL 15 MG PO TABS: 15.0000 mg | ORAL_TABLET | Freq: Every day | ORAL | 3 refills | Status: DC

## 2017-06-18 NOTE — Progress Notes (Signed)
Patient ID: Sherry White, female   DOB: December 23, 1960, 57 y.o.   MRN: [id number]   Reason for Appointment : Followup for Type 2 Diabetes  History of Present Illness           Diagnosis: Type 2 diabetes mellitus, date of diagnosis: 12/2012         Past history: For 2 months prior to diagnosis the patient had been having increased thirst and urination along with some blurred vision She was seen by her primary care physician and glucose was 593 an A1c of 11.4 Her urine showed small ketones also Because of her high sugars she was started on Levemir insulin. This was subsequently changed to NovoLog 97/94 for simplicity and postprandial control and 8/14.   RECENT history:   INSULIN regimen: V-go, 40 units pump, mealtime clicks: 8--0--1  Her last A1c was 8.7 as of 8/18 Fructosamine is improved at 255  She is again very erratic with her follow-up, was last seen in October and due to follow-up 6 weeks later  Current management, blood sugar patterns and problems:  Although she has been using the freestyle [place] sensor she has used this only 32% of the time recently  She again appears to check her blood sugars and is usually checking them only once a day or so  Her blood sugars are much better with starting back on Invokana and Trulicity on her last visit in October  Blood sugar patterns are not available for the late afternoons and evenings from her download  However otherwise her blood sugars appear to be excellent with average 118  Will have sporadic mild increase in blood sugar after meals including breakfast last Saturday  However also may have some tendency to low sugars before lunch since she is frequently not eating any or very little carbohydrates at breakfast with going to work  She does not adjust her BOLUSES based on what she is eating  Difficult to know if her boluses are adequate at suppertime with no data available  She has not used her fingerstick for  glucose monitoring at all  Glucose monitoring:  done about every other  day      Glucometer:   freestyle Libre   Blood Glucose readings from download  Mean values apply above for all meters except median for One Touch  PRE-MEAL Fasting Lunch Dinner Bedtime Overall  Glucose range:       Mean/median: 124  104    118     Self-care: DIET: Inconsistent and not planning meals, She has discussed diet with nurse educator in 2016   Meals:  2-3 meals per day.  Exercise: no walking currently                 Wt Readings from Last 3 Encounters:  06/18/17 215 lb (97.5 kg)  02/28/17 217 lb (98.4 kg)  07/05/16 213 lb (96.6 kg)     Lab Results  Component Value Date   HGBA1C 8.7 (H) 01/19/2017   HGBA1C 7.7 07/06/2016   HGBA1C 9.3 12/27/2015   Lab Results  Component Value Date   MICROALBUR 3.0 (H) 01/19/2017   LDLCALC  06/05/2007    91        Total Cholesterol/HDL:CHD Risk Coronary Heart Disease Risk Table                     Men   Women  1/2 Average Risk   3.4   3.3  Average Risk  5.0   4.4  2 X Average Risk   9.6   7.1  3 X Average Risk  23.4   11.0        Use the calculated Patient Ratio above and the CHD Risk Table to determine the patient's CHD Risk.        ATP III CLASSIFICATION (LDL):  <100     mg/dL   Optimal  100-129  mg/dL   Near or Above                    Optimal  130-159  mg/dL   Borderline  160-189  mg/dL   High  >190     mg/dL   Very High   CREATININE 0.81 06/08/2017     Allergies as of 06/18/2017      Reactions   Amoxicillin Rash      Medication List        Accurate as of 06/18/17 11:59 PM. Always use your most recent med list.          aspirin 81 MG EC tablet Take 81 mg by mouth daily.   ezetimibe-simvastatin 10-40 MG tablet Commonly known as:  VYTORIN Take 1 tablet by mouth at bedtime.   fenofibrate 160 MG tablet Take 1 tablet by mouth  daily   Fluticasone-Salmeterol 250-50 MCG/DOSE Aepb Commonly known as:  ADVAIR DISKUS Inhale 1  puff into the lungs 2 (two) times daily.   FREESTYLE LIBRE READER Devi 1 Device by Does not apply route every 30 (thirty) days.   FREESTYLE LIBRE SENSOR SYSTEM Misc USE TO TEST BLOOD GLUCOSE EVERY 10 DAYS AS DIRECTED   insulin lispro 100 UNIT/ML injection Commonly known as:  HUMALOG USE 78 UNITS PER DAY WITH V-GO PUMP   INVOKANA 100 MG Tabs tablet Generic drug:  canagliflozin TAKE 1 TABLET BY MOUTH EVERY DAY BEFORE BREAKFAST   KOMBIGLYZE XR 2.09-998 MG Tb24 Generic drug:  Saxagliptin-Metformin TAKE 2 TABLETS WITH SUPPER   lisinopril 40 MG tablet Commonly known as:  PRINIVIL,ZESTRIL Take 1 tablet by mouth  daily   multivitamin with minerals Tabs tablet Take 1 tablet by mouth daily.   NOVOLOG 100 UNIT/ML injection Generic drug:  insulin aspart USE 78 UNITS PER DAY WITH V-GO PUMP   ONETOUCH DELICA LANCETS 96E Misc USE TO CHECK BLOOD SUGAR 3 TIMES DAILY   ONETOUCH VERIO test strip Generic drug:  glucose blood CHECK BLOOD SUGARS TWICE A DAY   pioglitazone 15 MG tablet Commonly known as:  ACTOS Take 1 tablet (15 mg total) by mouth daily.   PROAIR HFA 108 (90 Base) MCG/ACT inhaler Generic drug:  albuterol INHALE 2 PUFFS INTO THE LUNGS EVERY 6 (SIX) HOURS AS NEEDED FOR WHEEZING OR SHORTNESS OF BREATH.   sertraline 100 MG tablet Commonly known as:  ZOLOFT TAKE 1 TABLET BY MOUTH TWICE A DAY   triamterene-hydrochlorothiazide 37.5-25 MG tablet Commonly known as:  MAXZIDE-25 Take 0.5 tablets by mouth daily.   TRULICITY 4.54 UJ/8.1XB Sopn Generic drug:  Dulaglutide INJECT IN THE ABDOMINAL SKIN AS DIRECTED ONCE A WEEK   V-GO 40 Kit USE 1 PER DAY   XANAX PO Take 0.25 mg by mouth as needed.       Allergies:  Allergies  Allergen Reactions  . Amoxicillin Rash    Past Medical History:  Diagnosis Date  . ANXIETY 06/17/2007  . ASTHMA 06/17/2007  . CHEST PAIN 06/17/2007  . DEPRESSION 06/17/2007  . Diabetes mellitus without complication ([place])   . Disc  disease,  degenerative, lumbar or lumbosacral   . Glucose intolerance (impaired glucose tolerance)   . GOITER, MULTINODULAR 10/22/2008  . Hyperlipidemia 07/13/2011  . HYPERSOMNIA 12/06/2009  . HYPERTENSION 06/17/2007  . HYPERTRIGLYCERIDEMIA 06/17/2007  . Impaired glucose tolerance 11/15/2010  . Left lateral epicondylitis 11/15/2010  . MVP (mitral valve prolapse)   . NASH (nonalcoholic steatohepatitis) 06/08/2011  . OSA (obstructive sleep apnea)   . Right carpal tunnel syndrome 11/15/2010  . Sleep apnea   . THYROID NODULE, RIGHT 10/19/2008  . TIA (transient ischemic attack)    2 TIA-20 years ago    Past Surgical History:  Procedure Laterality Date  . BIOPSY BREAST  june 2010   [facility]  . CHOLECYSTECTOMY    . INGUINAL HERNIA REPAIR     1968/1969 BIL  . WISDOM TOOTH EXTRACTION      Family History  Problem Relation Age of Onset  . Hypertension Father   . Diabetes Father   . Coronary artery disease Father   . Hypertension Mother   . Diabetes Mother   . Breast cancer Mother   . Emphysema Mother   . COPD Mother   . Colon polyps Mother   . Thyroid disease Sister        unknown type  . Alcohol abuse Unknown        uncle  . Tuberculosis Unknown        aunt  . Emphysema Maternal Grandmother     Social History:  reports that  has never smoked. she has never used smokeless tobacco. She reports that she does not drink alcohol or use drugs.    Review of Systems       Lipids: She has increase in triglycerides and has been prescribed Vytorin by PCP, also on fenofibrate.  Has irregular compliance with these medications  Lab Results  Component Value Date   CHOL 225 (H) 01/19/2017   HDL 35.60 (L) 01/19/2017   LDLCALC  06/05/2007    91        Total Cholesterol/HDL:CHD Risk Coronary Heart Disease Risk Table                     Men   Women  1/2 Average Risk   3.4   3.3  Average Risk       5.0   4.4  2 X Average Risk   9.6   7.1  3 X Average Risk  23.4   11.0         Use the calculated Patient Ratio above and the CHD Risk Table to determine the patient's CHD Risk.        ATP III CLASSIFICATION (LDL):  <100     mg/dL   Optimal  100-129  mg/dL   Near or Above                    Optimal  130-159  mg/dL   Borderline  160-189  mg/dL   High  >190     mg/dL   Very High   LDLDIRECT 122.0 01/19/2017   TRIG (H) 01/19/2017    411.0 Triglyceride is over 400; calculations on Lipids are invalid.   CHOLHDL 6 29/52/8413     Complications: Hepatic steatosis, Diagnosis initially made in 2012, Has had mild increase in liver functions recently She was on Actos since 4/17 However she stopped taking this in October on her own as she was started on Invokana and did not let  us know about this Has tendency to persistently high liver functions   Lab Results  Component Value Date   ALT 48 (H) 01/19/2017   HYPERTENSION: This appears to be persistently poorly controlled and she has not followed up with her PCP on this   BP Readings from Last 3 Encounters:  06/18/17 (!) 146/98  02/28/17 (!) 144/100  07/05/16 140/82     Physical Examination:  BP (!) 146/98   Pulse 95   Ht '5\' 4"'  (1.626 m)   Wt 215 lb (97.5 kg)   LMP 06/11/2012   SpO2 97%   BMI 36.90 kg/m     repeat blood pressure same with large cuff on the right side  ASSESSMENT:  Diabetes type 2, uncontrolled   See history of present illness for detailed discussion of current diabetes management, blood sugar patterns and problems identified  Her blood sugars are much better with adding Trulicity and Invokana to her insulin regimen Fructosamine is improved at 255  However she has not checked her sugars much and difficult to get her blood sugar patterns even though she has the sensor on most of the time Her diet is somewhat variable and she has not lost weight Also not motivated to exercise recently For now she has tendency to low sugars around lunchtime from not eating much carbohydrate and not  reducing her morning bolus accordingly   HYPERTENSION: Blood pressure is much higher even with restarting Invokana along with her lisinopril  Fatty liver: Currently on Actos but ALT is starting to go up again  PLAN:   RESTART Actos for insulin sensitivity and for her hepatic steatosis  Continue Trulicity and Invokana  Take only 2-4 units bolus for breakfast if eating low provided meal  Start checking blood sugars at least twice a day and especially at bedtime to get better blood sugar patterns  Consistent diet at lunch and dinner  Restart regular walking and also indoor exercise ideas given  Start MAXZIDE, half tablet daily for hypertension, will not use HCTZ because of low normal potassium  She needs more consistent and regular follow-up  Counseling time on subjects discussed in assessment and plan sections is over 50% of today's 25 minute visit   Patient Instructions  More testing at nite  Restart actos  2 Clicks in am  INDOOR EXERCISE IDEAS   Use the following examples for a creative indoor workout (perform each move for 2-3 minutes):   Warm up. Put on some music that makes you feel like moving, and dance around the living room.  Watch exercise shows on TV and move along with them. There are tons of free cable channels that have daily exercise shows on them for all levels - beginner through advanced.   You can easily find a number of exercise videos but use one that will suit your liking and exercise level; you can do these on your own schedule.  Walk up and down the steps.  Do dumbbell curls and presses (if you don't have weights, use full water bottles).  Do assisted squats, keeping your back on a fitness ball against the wall or using the back of the couch for support.  Shadow box: Lift and lower the left leg; jab with the right arm, then the left; then lift and lower the right leg.  Fence (you don't even need swords). Pretend you're holding a sword in  each hand. Create an X pattern standing still, then moving forward and back.  Hop on your  exercise bike or treadmill -- or, for something different, use a weighted hula hoop. If you don't have any of those, just go back to dancing.  Do abdominal crunches (hold a weighted ball for added resistance).  Cool down with [employer] "I Feel Good" -- or whatever tune makes you feel good   Counseling time on subjects discussed in assessment and plan sections is over 50% of today's 25 minute visit   Elayne Snare 06/19/2017, 1:00 PM

## 2017-06-18 NOTE — Patient Instructions (Addendum)
More testing at [facility]  2 Clicks in am  INDOOR EXERCISE IDEAS   Use the following examples for a creative indoor workout (perform each move for 2-3 minutes):   Warm up. Put on some music that makes you feel like moving, and dance around the living room.  Watch exercise shows on TV and move along with them. There are tons of free cable channels that have daily exercise shows on them for all levels - beginner through advanced.   You can easily find a number of exercise videos but use one that will suit your liking and exercise level; you can do these on your own schedule.  Walk up and down the steps.  Do dumbbell curls and presses (if you don't have weights, use full water bottles).  Do assisted squats, keeping your back on a fitness ball against the wall or using the back of the couch for support.  Shadow box: Lift and lower the left leg; jab with the right arm, then the left; then lift and lower the right leg.  Fence (you don't even need swords). Pretend you're holding a sword in each hand. Create an X pattern standing still, then moving forward and back.  Hop on your exercise bike or treadmill -- or, for something different, use a weighted hula hoop. If you don't have any of those, just go back to dancing.  Do abdominal crunches (hold a weighted ball for added resistance).  Cool down with [employer] "I Feel Good" -- or whatever tune makes you feel good

## 2017-06-22 ENCOUNTER — Other Ambulatory Visit: Payer: Self-pay | Admitting: Endocrinology

## 2017-06-22 ENCOUNTER — Encounter: Payer: Self-pay | Admitting: Endocrinology

## 2017-06-26 ENCOUNTER — Telehealth: Payer: Self-pay | Admitting: Internal Medicine

## 2017-06-26 ENCOUNTER — Encounter: Payer: Self-pay | Admitting: Endocrinology

## 2017-06-26 MED ORDER — ALPRAZOLAM 0.25 MG PO TABS: 0.2500 mg | ORAL_TABLET | Freq: Two times a day (BID) | ORAL | 0 refills | Status: DC | PRN

## 2017-06-26 NOTE — Telephone Encounter (Signed)
Limited rx xanax done erx  Please let pt know, we would need a ROV for further refills

## 2017-06-26 NOTE — Telephone Encounter (Signed)
Called pt, left msg with details below.

## 2017-07-04 ENCOUNTER — Encounter: Payer: Self-pay | Admitting: Endocrinology

## 2017-07-09 ENCOUNTER — Other Ambulatory Visit: Payer: Self-pay | Admitting: Internal Medicine

## 2017-07-09 NOTE — Telephone Encounter (Signed)
Due to office refill policy, we are unable to further refill the sertraline  Please ask pt to make ROV for further refills

## 2017-07-11 ENCOUNTER — Other Ambulatory Visit: Payer: Self-pay | Admitting: Endocrinology

## 2017-07-11 MED ORDER — INSULIN LISPRO 100 UNIT/ML (KWIKPEN): PEN_INJECTOR | SUBCUTANEOUS | 1 refills | Status: DC

## 2017-07-11 MED ORDER — INSULIN GLARGINE 300 UNIT/ML ~~LOC~~ SOPN: 44.0000 [IU] | PEN_INJECTOR | Freq: Every day | SUBCUTANEOUS | 0 refills | Status: DC

## 2017-07-16 DIAGNOSIS — E1165 Type 2 diabetes mellitus with hyperglycemia: Secondary | ICD-10-CM

## 2017-07-17 ENCOUNTER — Telehealth: Payer: Self-pay

## 2017-07-17 NOTE — Telephone Encounter (Signed)
PA approved for [place], [id number] 6/72/09-1/98/02

## 2017-07-27 MED ORDER — PEN NEEDLES 32G X 4 MM MISC: 1.0000 | Freq: Every day | 1 refills | Status: DC

## 2017-07-31 ENCOUNTER — Other Ambulatory Visit: Payer: Self-pay | Admitting: Endocrinology

## 2017-08-08 ENCOUNTER — Other Ambulatory Visit: Payer: Self-pay | Admitting: Endocrinology

## 2017-08-16 ENCOUNTER — Ambulatory Visit: Payer: 59 | Admitting: Endocrinology

## 2017-08-16 DIAGNOSIS — Z0289 Encounter for other administrative examinations: Secondary | ICD-10-CM

## 2017-09-17 ENCOUNTER — Encounter: Payer: Self-pay | Admitting: Internal Medicine

## 2017-09-17 DIAGNOSIS — E049 Nontoxic goiter, unspecified: Secondary | ICD-10-CM

## 2017-09-18 ENCOUNTER — Other Ambulatory Visit (INDEPENDENT_AMBULATORY_CARE_PROVIDER_SITE_OTHER): Payer: 59

## 2017-09-18 DIAGNOSIS — E1165 Type 2 diabetes mellitus with hyperglycemia: Secondary | ICD-10-CM

## 2017-09-18 DIAGNOSIS — Z794 Long term (current) use of insulin: Secondary | ICD-10-CM

## 2017-09-18 DIAGNOSIS — E781 Pure hyperglyceridemia: Secondary | ICD-10-CM | POA: Diagnosis not present

## 2017-09-18 LAB — LIPID PANEL
Cholesterol: 236 mg/dL — ABNORMAL HIGH (ref 0–200)
HDL: 37.6 mg/dL — ABNORMAL LOW (ref >39.00)
Total CHOL/HDL Ratio: 6
Triglycerides: 418 mg/dL — ABNORMAL HIGH (ref 0.0–149.0)

## 2017-09-18 LAB — BASIC METABOLIC PANEL
BUN: 11 mg/dL (ref 6–23)
CO2: 27 mEq/L (ref 19–32)
Calcium: 9.4 mg/dL (ref 8.4–10.5)
Chloride: 102 mEq/L (ref 96–112)
Creatinine, Ser: 0.76 mg/dL (ref 0.40–1.20)
GFR: 84.18 mL/min (ref >60.00)
Glucose, Bld: 142 mg/dL — ABNORMAL HIGH (ref 70–99)
Potassium: 4.4 mEq/L (ref 3.5–5.1)
Sodium: 134 mEq/L — ABNORMAL LOW (ref 135–145)

## 2017-09-18 LAB — HEMOGLOBIN A1C: Hgb A1c MFr Bld: 7.1 % — ABNORMAL HIGH (ref 4.6–6.5)

## 2017-09-18 LAB — LDL CHOLESTEROL, DIRECT: Direct LDL: 130 mg/dL

## 2017-09-26 ENCOUNTER — Other Ambulatory Visit: Payer: Self-pay | Admitting: Internal Medicine

## 2017-10-11 ENCOUNTER — Other Ambulatory Visit: Payer: Self-pay | Admitting: Endocrinology

## 2017-10-19 ENCOUNTER — Other Ambulatory Visit: Payer: Self-pay | Admitting: Endocrinology

## 2017-11-07 ENCOUNTER — Encounter: Payer: Self-pay | Admitting: Endocrinology

## 2017-11-07 ENCOUNTER — Ambulatory Visit: Payer: 59 | Admitting: Endocrinology

## 2017-11-07 VITALS — BP 132/88 | HR 86 | Ht 64.0 in | Wt 209.8 lb

## 2017-11-07 DIAGNOSIS — E042 Nontoxic multinodular goiter: Secondary | ICD-10-CM

## 2017-11-07 DIAGNOSIS — E1165 Type 2 diabetes mellitus with hyperglycemia: Secondary | ICD-10-CM | POA: Diagnosis not present

## 2017-11-07 DIAGNOSIS — Z794 Long term (current) use of insulin: Secondary | ICD-10-CM | POA: Diagnosis not present

## 2017-11-07 DIAGNOSIS — I10 Essential (primary) hypertension: Secondary | ICD-10-CM

## 2017-11-07 DIAGNOSIS — K760 Fatty (change of) liver, not elsewhere classified: Secondary | ICD-10-CM

## 2017-11-07 LAB — COMPREHENSIVE METABOLIC PANEL
ALT: 28 U/L (ref 0–35)
AST: 26 U/L (ref 0–37)
Albumin: 4.2 g/dL (ref 3.5–5.2)
Alkaline Phosphatase: 106 U/L (ref 39–117)
BUN: 11 mg/dL (ref 6–23)
CO2: 28 mEq/L (ref 19–32)
Calcium: 9.5 mg/dL (ref 8.4–10.5)
Chloride: 102 mEq/L (ref 96–112)
Creatinine, Ser: 0.88 mg/dL (ref 0.40–1.20)
GFR: 71.04 mL/min (ref >60.00)
Glucose, Bld: 109 mg/dL — ABNORMAL HIGH (ref 70–99)
Potassium: 4 mEq/L (ref 3.5–5.1)
Sodium: 136 mEq/L (ref 135–145)
Total Bilirubin: 0.4 mg/dL (ref 0.2–1.2)
Total Protein: 7.6 g/dL (ref 6.0–8.3)

## 2017-11-07 LAB — TSH: TSH: 2.91 u[IU]/mL (ref 0.35–4.50)

## 2017-11-07 LAB — T4, FREE: Free T4: 0.7 ng/dL (ref 0.60–1.60)

## 2017-11-07 NOTE — Progress Notes (Signed)
Patient ID: Sherry White, female   DOB: 06-19-1960, 57 y.o.   MRN: [id number]   Reason for Appointment : Followup for Type 2 Diabetes  History of Present Illness           Diagnosis: Type 2 diabetes mellitus, date of diagnosis: 12/2012         Past history: For 2 months prior to diagnosis the patient had been having increased thirst and urination along with some blurred vision She was seen by her primary care physician and glucose was 593 an A1c of 11.4 Her urine showed small ketones also Because of her high sugars she was started on Levemir insulin. This was subsequently changed to NovoLog 19/37 for simplicity and postprandial control and 8/14.   RECENT history:   INSULIN regimen: TOUJEO 44 units and Humalog 6--8--10 before meals Non-insulin hypoglycemic drugs: Trulicity 9.02 mg weekly, Actos 15 mg daily  Her last A1c was 7.1  She went off her V-go pump because of excessive cost  Current management, blood sugar patterns and problems:  She is again irregular with her follow-up and has not come back after her labs from April  Also is checking blood sugars very erratically even though she has a freestyle libre sensor and mostly checking in the mornings  FASTING blood sugars are averaging about 138  Have no idea what her blood sugars are during the daytime and bedtime readings are about 150 average  She thinks she is doing fairly well with taking her insulin before meals as directed but not adjusting much based on what she is eating  He says she has been preoccupied with her work and not planning her meals consistently or exercising  Glucose monitoring:  done about every other  day      Glucometer:   freestyle Libre   Blood Glucose readings from download as above  Average blood sugar at home 146, data available only 21% of the time for the last 3 weeks on her freestyle Libre   Self-care: DIET: Inconsistent and not planning meals, She has discussed diet with  nurse educator in 2016   Meals:  2-3 meals per day.  Exercise: no walking currently                 Wt Readings from Last 3 Encounters:  11/07/17 209 lb 12.8 oz (95.2 kg)  06/18/17 215 lb (97.5 kg)  02/28/17 217 lb (98.4 kg)     Lab Results  Component Value Date   HGBA1C 7.1 (H) 09/18/2017   HGBA1C 8.7 (H) 01/19/2017   HGBA1C 7.7 07/06/2016   Lab Results  Component Value Date   MICROALBUR 3.0 (H) 01/19/2017   LDLCALC  06/05/2007    91        Total Cholesterol/HDL:CHD Risk Coronary Heart Disease Risk Table                     Men   Women  1/2 Average Risk   3.4   3.3  Average Risk       5.0   4.4  2 X Average Risk   9.6   7.1  3 X Average Risk  23.4   11.0        Use the calculated Patient Ratio above and the CHD Risk Table to determine the patient's CHD Risk.        ATP III CLASSIFICATION (LDL):  <100     mg/dL   Optimal  100-129  mg/dL   Near or Above                    Optimal  130-159  mg/dL   Borderline  160-189  mg/dL   High  >190     mg/dL   Very High   CREATININE 0.88 11/07/2017     Allergies as of 11/07/2017      Reactions   Amoxicillin Rash      Medication List        Accurate as of 11/07/17 11:59 PM. Always use your most recent med list.          ALPRAZolam 0.25 MG tablet Commonly known as:  XANAX Take 1 tablet (0.25 mg total) by mouth 2 (two) times daily as needed for anxiety.   aspirin 81 MG EC tablet Take 81 mg by mouth daily.   ezetimibe-simvastatin 10-40 MG tablet Commonly known as:  VYTORIN Take 1 tablet by mouth at bedtime.   fenofibrate 160 MG tablet Take 1 tablet by mouth  daily   Fluticasone-Salmeterol 250-50 MCG/DOSE Aepb Commonly known as:  ADVAIR DISKUS Inhale 1 puff into the lungs 2 (two) times daily.   FREESTYLE LIBRE READER Devi 1 Device by Does not apply route every 30 (thirty) days.   FREESTYLE LIBRE SENSOR SYSTEM Misc USE TO TEST BLOOD GLUCOSE EVERY 10 DAYS AS DIRECTED   insulin lispro 100 UNIT/ML  KiwkPen Commonly known as:  HUMALOG KWIKPEN 6 UNITS BEFORE BREAKFAST, 8 UNITS AT LUNCH AND 8-10 UNITS BEFORE DINNER   lisinopril 40 MG tablet Commonly known as:  PRINIVIL,ZESTRIL Take 1 tablet by mouth  daily   multivitamin with minerals Tabs tablet Take 1 tablet by mouth daily.   Pen Needles 32G X 4 MM Misc 1 each by Does not apply route daily. Use with Novolog and Trulicity   pioglitazone 15 MG tablet Commonly known as:  ACTOS Take 1 tablet (15 mg total) by mouth daily.   PROAIR HFA 108 (90 Base) MCG/ACT inhaler Generic drug:  albuterol INHALE 2 PUFFS INTO THE LUNGS EVERY 6 (SIX) HOURS AS NEEDED FOR WHEEZING OR SHORTNESS OF BREATH.   sertraline 100 MG tablet Commonly known as:  ZOLOFT TAKE 1 TABLET BY MOUTH TWICE A DAY   TOUJEO SOLOSTAR 300 UNIT/ML Sopn Generic drug:  Insulin Glargine INJECT 44 UNITS INTO THE SKIN DAILY BEFORE BREAKFAST.   triamterene-hydrochlorothiazide 37.5-25 MG tablet Commonly known as:  MAXZIDE-25 Take 0.5 tablets by mouth daily.   TRULICITY 9.62 XB/2.8UX Sopn Generic drug:  Dulaglutide INJECT IN THE ABDOMINAL SKIN AS DIRECTED ONCE A WEEK. NEEDS TO MAKE APPOINTMENT FOR REFILLS       Allergies:  Allergies  Allergen Reactions  . Amoxicillin Rash    Past Medical History:  Diagnosis Date  . ANXIETY 06/17/2007  . ASTHMA 06/17/2007  . CHEST PAIN 06/17/2007  . DEPRESSION 06/17/2007  . Diabetes mellitus without complication ([place])   . Disc disease, degenerative, lumbar or lumbosacral   . Glucose intolerance (impaired glucose tolerance)   . GOITER, MULTINODULAR 10/22/2008  . Hyperlipidemia 07/13/2011  . HYPERSOMNIA 12/06/2009  . HYPERTENSION 06/17/2007  . HYPERTRIGLYCERIDEMIA 06/17/2007  . Impaired glucose tolerance 11/15/2010  . Left lateral epicondylitis 11/15/2010  . MVP (mitral valve prolapse)   . NASH (nonalcoholic steatohepatitis) 06/08/2011  . OSA (obstructive sleep apnea)   . Right carpal tunnel syndrome 11/15/2010  . Sleep apnea   .  THYROID NODULE, RIGHT 10/19/2008  . TIA (transient ischemic attack)    2 TIA-20 years  ago    Past Surgical History:  Procedure Laterality Date  . BIOPSY BREAST  june 2010   [facility]  . CHOLECYSTECTOMY    . INGUINAL HERNIA REPAIR     1968/1969 BIL  . WISDOM TOOTH EXTRACTION      Family History  Problem Relation Age of Onset  . Hypertension Father   . Diabetes Father   . Coronary artery disease Father   . Hypertension Mother   . Diabetes Mother   . Breast cancer Mother   . Emphysema Mother   . COPD Mother   . Colon polyps Mother   . Thyroid disease Sister        unknown type  . Alcohol abuse Unknown        uncle  . Tuberculosis Unknown        aunt  . Emphysema Maternal Grandmother     Social History:  reports that she has never smoked. She has never used smokeless tobacco. She reports that she does not drink alcohol or use drugs.    Review of Systems       Lipids: She has increase in triglycerides and has been prescribed Vytorin by PCP, also on fenofibrate.  Has irregular compliance with these medications  Lab Results  Component Value Date   CHOL 236 (H) 09/18/2017   HDL 37.60 (L) 09/18/2017   LDLCALC  06/05/2007    91        Total Cholesterol/HDL:CHD Risk Coronary Heart Disease Risk Table                     Men   Women  1/2 Average Risk   3.4   3.3  Average Risk       5.0   4.4  2 X Average Risk   9.6   7.1  3 X Average Risk  23.4   11.0        Use the calculated Patient Ratio above and the CHD Risk Table to determine the patient's CHD Risk.        ATP III CLASSIFICATION (LDL):  <100     mg/dL   Optimal  100-129  mg/dL   Near or Above                    Optimal  130-159  mg/dL   Borderline  160-189  mg/dL   High  >190     mg/dL   Very High   LDLDIRECT 130.0 09/18/2017   TRIG (H) 09/18/2017    418.0 Triglyceride is over 400; calculations on Lipids are invalid.   CHOLHDL 6 91/47/8295     Complications: Hepatic  steatosis, Diagnosis initially made in 2012    Has tendency to persistently high liver functions She has gone back to Actos as recommended on her last visit   Lab Results  Component Value Date   ALT 28 11/07/2017   HYPERTENSION: This appears to be somewhat better even though she has not taken Invokana    BP Readings from Last 3 Encounters:  11/07/17 132/88  06/18/17 (!) 146/98  02/28/17 (!) 144/100     Physical Examination:  BP 132/88 (BP Location: Left Arm, Patient Position: Sitting, Cuff Size: Normal)   Pulse 86   Ht 5\' 4"  (1.626 m)   Wt 209 lb 12.8 oz (95.2 kg)   LMP 06/11/2012   SpO2 98%   BMI 36.01 kg/m      ASSESSMENT:  Diabetes type  2, uncontrolled   See history of present illness for detailed discussion of current diabetes management, blood sugar patterns and problems identified  Her blood sugars are looking fairly good and her last A1c was 7.1 Also benefiting from Trulicity and Actos She does need to check blood sugars much more consistently has not been doing much except some readings sporadically in the mornings  HYPERTENSION: Blood pressure is high normal, has had recent stress  History of multinodular goiter: We will recheck TSH today  Fatty liver: Currently on Actos but ALT has not been checked since then  LIPIDS: Her last lipids were not well controlled and emphasized the need to take her medications regularly including Vytorin and fenofibrate, needs fasting labs  PLAN:  Check labs for fructosamine Check ALT again She needs to check her blood sugars consistently at all different times and she thinks he can do better since she is not working this summer She will try to walk regularly for exercise also Adjust Humalog based on meal size and try to keep readings after meals under 160 Check fasting lipids on the next visit  Patient Instructions  Check blood sugars on waking up    Also check blood sugars about 2 hours after a meal and do this  after different meals by rotation  Recommended blood sugar levels on waking up is 90-130 and about 2 hours after meal is 130-160  Please bring your blood sugar monitor to each visit, thank you     Total visit time for evaluation and management of multiple problems and counseling =25 minutes  Real Cona 11/08/2017, 11:11 AM   ADDENDUM: ALT back to 28 Fructosamine improved at 236

## 2017-11-07 NOTE — Patient Instructions (Signed)
Check blood sugars on waking up    Also check blood sugars about 2 hours after a meal and do this after different meals by rotation  Recommended blood sugar levels on waking up is 90-130 and about 2 hours after meal is 130-160  Please bring your blood sugar monitor to each visit, thank you  

## 2017-11-08 LAB — FRUCTOSAMINE: Fructosamine: 236 umol/L (ref 0–285)

## 2017-11-13 ENCOUNTER — Other Ambulatory Visit: Payer: Self-pay | Admitting: Endocrinology

## 2017-11-14 ENCOUNTER — Other Ambulatory Visit: Payer: Self-pay

## 2017-11-14 ENCOUNTER — Encounter: Payer: Self-pay | Admitting: Endocrinology

## 2017-11-14 MED ORDER — FREESTYLE LIBRE SENSOR SYSTEM MISC: 2 refills | Status: DC

## 2017-11-21 ENCOUNTER — Other Ambulatory Visit: Payer: Self-pay | Admitting: Endocrinology

## 2017-11-26 ENCOUNTER — Encounter: Payer: Self-pay | Admitting: Endocrinology

## 2017-11-26 ENCOUNTER — Telehealth: Payer: Self-pay

## 2017-11-26 ENCOUNTER — Other Ambulatory Visit: Payer: Self-pay | Admitting: Endocrinology

## 2017-11-26 DIAGNOSIS — E042 Nontoxic multinodular goiter: Secondary | ICD-10-CM

## 2017-11-26 NOTE — Telephone Encounter (Signed)
Pt would like to know the results of the imaging that was done on her neck.

## 2017-11-26 NOTE — Telephone Encounter (Signed)
Her TSH thyroid level was normal.  Do not see any discussion about doing a x-ray study, will discuss on her next visit

## 2017-11-26 NOTE — Telephone Encounter (Signed)
Sent MyChart message informing pt of this.

## 2017-11-28 ENCOUNTER — Other Ambulatory Visit: Payer: Self-pay

## 2017-11-28 MED ORDER — GLUCOSE BLOOD VI STRP: 1.0000 | ORAL_STRIP | 3 refills | Status: DC | PRN

## 2017-11-28 MED ORDER — FREESTYLE LANCETS MISC: 1.0000 | 3 refills | Status: DC | PRN

## 2017-12-06 ENCOUNTER — Ambulatory Visit
Admission: RE | Admit: 2017-12-06 | Discharge: 2017-12-06 | Disposition: A | Discharge status: Home / Self Care | Payer: 59 | Source: Ambulatory Visit | Attending: Endocrinology | Admitting: Endocrinology

## 2017-12-06 DIAGNOSIS — E042 Nontoxic multinodular goiter: Secondary | ICD-10-CM | POA: Diagnosis not present

## 2017-12-24 ENCOUNTER — Other Ambulatory Visit: Payer: Self-pay | Admitting: Endocrinology

## 2017-12-25 ENCOUNTER — Ambulatory Visit: Payer: 59 | Admitting: Internal Medicine

## 2018-01-17 DIAGNOSIS — L918 Other hypertrophic disorders of the skin: Secondary | ICD-10-CM | POA: Diagnosis not present

## 2018-01-17 DIAGNOSIS — D225 Melanocytic nevi of trunk: Secondary | ICD-10-CM | POA: Diagnosis not present

## 2018-02-01 MED ORDER — GLUCOSE BLOOD VI STRP: 1.0000 | ORAL_STRIP | 3 refills | Status: DC | PRN

## 2018-02-07 ENCOUNTER — Ambulatory Visit: Payer: 59 | Admitting: Endocrinology

## 2018-02-11 ENCOUNTER — Other Ambulatory Visit: Payer: Self-pay | Admitting: Endocrinology

## 2018-02-13 ENCOUNTER — Encounter: Payer: Self-pay | Admitting: Endocrinology

## 2018-02-13 ENCOUNTER — Ambulatory Visit: Payer: 59 | Admitting: Endocrinology

## 2018-02-13 VITALS — BP 158/102 | HR 78 | Resp 20 | Ht 64.0 in | Wt 210.0 lb

## 2018-02-13 DIAGNOSIS — I10 Essential (primary) hypertension: Secondary | ICD-10-CM

## 2018-02-13 DIAGNOSIS — E782 Mixed hyperlipidemia: Secondary | ICD-10-CM

## 2018-02-13 DIAGNOSIS — E1165 Type 2 diabetes mellitus with hyperglycemia: Secondary | ICD-10-CM

## 2018-02-13 DIAGNOSIS — Z794 Long term (current) use of insulin: Secondary | ICD-10-CM | POA: Diagnosis not present

## 2018-02-13 DIAGNOSIS — K760 Fatty (change of) liver, not elsewhere classified: Secondary | ICD-10-CM

## 2018-02-13 LAB — POCT GLYCOSYLATED HEMOGLOBIN (HGB A1C): HbA1c, POC (controlled diabetic range): 6.8 % (ref 0.0–7.0)

## 2018-02-13 NOTE — Progress Notes (Signed)
Patient ID: Sherry White, female   DOB: June 06, 1960, 57 y.o.   MRN: [id number]   Reason for Appointment : Followup for Type 2 Diabetes  History of Present Illness           Diagnosis: Type 2 diabetes mellitus, date of diagnosis: 12/2012         Past history: For 2 months prior to diagnosis the patient had been having increased thirst and urination along with some blurred vision She was seen by her primary care physician and glucose was 593 an A1c of 11.4 Her urine showed small ketones also Because of her high sugars she was started on Levemir insulin. This was subsequently changed to NovoLog 08/67 for simplicity and postprandial control and 8/14.   RECENT history:   INSULIN regimen: TOUJEO 22 units for 18d and Humalog 4-6-6/8  Non-insulin hypoglycemic drugs: Trulicity 6.19 mg weekly, not on Actos 15 mg daily  Her last A1c was 7.1 and is now 6.8  Current management, blood sugar patterns and problems:  She states that when she retired at the beginning of the month she changed her diet consistently and also started cooking more healthy meals at home  She thinks her blood sugars are starting to get relatively low and empirically she cut her insulin dose is about 50% and stopped her Actos  However blood sugars in the first week of September still looked relatively high including fasting  Although she thinks she has lost 8 pounds her weight is unchanged compared to June  She is trying to exercise at the gym and walk on a treadmill or other exercises  More recently in the last few days her morning sugars are around 128-130 and nonfasting blood sugars are checked only rarely, recently 118, 109 and previously as high as 171 and 242  She does continue to take her Trulicity  Previously using freestyle [place] system but now using the freestyle fingersticks only  Glucose monitoring:  done about every other  day      Glucometer:   freestyle Libre   Blood Glucose readings  from download as above  Average blood sugar at home 138 for the last 2 weeks, has 20 readings   Self-care: DIET: Improved as above  She has discussed diet with nurse educator in 2016   Meals:  2-3 meals per day.  Exercise: Gym 3/7, mostly walking                Wt Readings from Last 3 Encounters:  02/13/18 210 lb (95.3 kg)  11/07/17 209 lb 12.8 oz (95.2 kg)  06/18/17 215 lb (97.5 kg)     Lab Results  Component Value Date   HGBA1C 6.8 02/13/2018   HGBA1C 7.1 (H) 09/18/2017   HGBA1C 8.7 (H) 01/19/2017   Lab Results  Component Value Date   MICROALBUR 3.0 (H) 01/19/2017   LDLCALC  06/05/2007    91        Total Cholesterol/HDL:CHD Risk Coronary Heart Disease Risk Table                     Men   Women  1/2 Average Risk   3.4   3.3  Average Risk       5.0   4.4  2 X Average Risk   9.6   7.1  3 X Average Risk  23.4   11.0        Use the calculated Patient Ratio above and the CHD  Risk Table to determine the patient's CHD Risk.        ATP III CLASSIFICATION (LDL):  <100     mg/dL   Optimal  100-129  mg/dL   Near or Above                    Optimal  130-159  mg/dL   Borderline  160-189  mg/dL   High  >190     mg/dL   Very High   CREATININE 0.88 11/07/2017     Allergies as of 02/13/2018      Reactions   Amoxicillin Rash      Medication List        Accurate as of 02/13/18  9:06 PM. Always use your most recent med list.          ALPRAZolam 0.25 MG tablet Commonly known as:  XANAX Take 1 tablet (0.25 mg total) by mouth 2 (two) times daily as needed for anxiety.   aspirin 81 MG EC tablet Take 81 mg by mouth daily.   fenofibrate 160 MG tablet Take 1 tablet by mouth  daily   Fluticasone-Salmeterol 250-50 MCG/DOSE Aepb Commonly known as:  ADVAIR Inhale 1 puff into the lungs 2 (two) times daily.   freestyle lancets 1 each by Other route as needed for other. Use as instructed to stick finger to check blood sugar.   glucose blood test strip 1 each by Other  route as needed for other. Use as instructed to check blood sugar.   insulin lispro 100 UNIT/ML KiwkPen Commonly known as:  HUMALOG 6 UNITS BEFORE BREAKFAST, 8 UNITS AT LUNCH AND 8-10 UNITS BEFORE DINNER   lisinopril 40 MG tablet Commonly known as:  PRINIVIL,ZESTRIL Take 1 tablet by mouth  daily   multivitamin with minerals Tabs tablet Take 1 tablet by mouth daily.   Pen Needles 32G X 4 MM Misc 1 each by Does not apply route daily. Use with Novolog and Trulicity   pioglitazone 15 MG tablet Commonly known as:  ACTOS TAKE 1 TABLET BY MOUTH EVERY DAY   PROAIR HFA 108 (90 Base) MCG/ACT inhaler Generic drug:  albuterol INHALE 2 PUFFS INTO THE LUNGS EVERY 6 (SIX) HOURS AS NEEDED FOR WHEEZING OR SHORTNESS OF BREATH.   sertraline 100 MG tablet Commonly known as:  ZOLOFT TAKE 1 TABLET BY MOUTH TWICE A DAY   TOUJEO SOLOSTAR 300 UNIT/ML Sopn Generic drug:  Insulin Glargine INJECT 44 UNITS INTO THE SKIN DAILY BEFORE BREAKFAST.   triamterene-hydrochlorothiazide 37.5-25 MG tablet Commonly known as:  MAXZIDE-25 TAKE 1/2 TABLET DAILY   TRULICITY 8.84 ZY/6.0YT Sopn Generic drug:  Dulaglutide INJECT IN THE ABDOMINAL SKIN AS DIRECTED ONCE A WEEK. NEEDS TO MAKE APPOINTMENT FOR REFILLS       Allergies:  Allergies  Allergen Reactions  . Amoxicillin Rash    Past Medical History:  Diagnosis Date  . ANXIETY 06/17/2007  . ASTHMA 06/17/2007  . CHEST PAIN 06/17/2007  . DEPRESSION 06/17/2007  . Diabetes mellitus without complication ([place])   . Disc disease, degenerative, lumbar or lumbosacral   . Glucose intolerance (impaired glucose tolerance)   . GOITER, MULTINODULAR 10/22/2008  . Hyperlipidemia 07/13/2011  . HYPERSOMNIA 12/06/2009  . HYPERTENSION 06/17/2007  . HYPERTRIGLYCERIDEMIA 06/17/2007  . Impaired glucose tolerance 11/15/2010  . Left lateral epicondylitis 11/15/2010  . MVP (mitral valve prolapse)   . NASH (nonalcoholic steatohepatitis) 06/08/2011  . OSA (obstructive sleep apnea)     . Right carpal tunnel syndrome 11/15/2010  .  Sleep apnea   . THYROID NODULE, RIGHT 10/19/2008  . TIA (transient ischemic attack)    2 TIA-20 years ago    Past Surgical History:  Procedure Laterality Date  . BIOPSY BREAST  june 2010   [facility]  . CHOLECYSTECTOMY    . INGUINAL HERNIA REPAIR     1968/1969 BIL  . WISDOM TOOTH EXTRACTION      Family History  Problem Relation Age of Onset  . Hypertension Father   . Diabetes Father   . Coronary artery disease Father   . Hypertension Mother   . Diabetes Mother   . Breast cancer Mother   . Emphysema Mother   . COPD Mother   . Colon polyps Mother   . Thyroid disease Sister        unknown type  . Alcohol abuse Unknown        uncle  . Tuberculosis Unknown        aunt  . Emphysema Maternal Grandmother     Social History:  reports that she has never smoked. She has never used smokeless tobacco. She reports that she does not drink alcohol or use drugs.    Review of Systems       Lipids: She has increase in triglycerides and has been prescribed Vytorin by PCP,   Currently not on fenofibrate, she stopped this on her own recently .  No recent labs available   Lab Results  Component Value Date   CHOL 236 (H) 09/18/2017   HDL 37.60 (L) 09/18/2017   LDLCALC  06/05/2007    91        Total Cholesterol/HDL:CHD Risk Coronary Heart Disease Risk Table                     Men   Women  1/2 Average Risk   3.4   3.3  Average Risk       5.0   4.4  2 X Average Risk   9.6   7.1  3 X Average Risk  23.4   11.0        Use the calculated Patient Ratio above and the CHD Risk Table to determine the patient's CHD Risk.        ATP III CLASSIFICATION (LDL):  <100     mg/dL   Optimal  100-129  mg/dL   Near or Above                    Optimal  130-159  mg/dL   Borderline  160-189  mg/dL   High  >190     mg/dL   Very High   LDLDIRECT 130.0 09/18/2017   TRIG (H) 09/18/2017    418.0 Triglyceride is over 400;  calculations on Lipids are invalid.   CHOLHDL 6 78/29/5621     Complications: Hepatic steatosis, Diagnosis initially made in 2012 Liver functions are improved in June with renewing Actos and somewhat better blood sugar control  She has gone off Actos tablets this month on her own     Lab Results  Component Value Date   ALT 28 11/07/2017   HYPERTENSION:   Home BP recently about 140/90 She wanted to cut back on her medications and has stopped her lisinopril, she does have a long-standing history of hypertension however  BP Readings from Last 3 Encounters:  02/13/18 (!) 158/102  11/07/17 132/88  06/18/17 (!) 146/98    History of multinodular goiter: She had  a follow-up ultrasound which did not show any changes, she had requested a follow-up ultrasound   Physical Examination:  BP (!) 158/102 (BP Location: Left Arm, Patient Position: Sitting, Cuff Size: Normal)   Pulse 78   Resp 20   Ht 5\' 4"  (1.626 m)   Wt 210 lb (95.3 kg)   LMP 06/11/2012   BMI 36.05 kg/m      ASSESSMENT:  Diabetes type 2, insulin requiring  See history of present illness for detailed discussion of current diabetes management, blood sugar patterns and problems identified  Her A1c is improved further at 6.8, previously 7.1  Recently with her trying to significantly improve her diet and exercise regularly her blood sugars are much easier to control and she is taking about half of her previous insulin doses Although her blood sugars are inconsistent they appear to be much better in the last 4 to 5 days, as above she has checked blood sugars irregularly and not enough after meals Also stopped Actos on her own  HYPERTENSION: Blood pressure is high without lisinopril   Fatty liver: Currently not on Actos which may have helped normalize her liver functions  LIPIDS: Her last lipids were not well controlled and she is reluctant to take medications, does need follow-up fasting labs to decide whether  she needs to go back on fenofibrate that she has stopped  History of goiter: Reassured her that her ultrasound is not significantly changed and no further action needed, the size of the goiter is unlikely to be causing any local pressure symptoms  PLAN:  Check blood sugar more consistently especially after meals to help adjust her insulin Since her fasting readings are around 130s she can go up at least to 24 on the Toujeo She needs to restart Actos because of her history of fatty liver and reassured her that it is safe to take Restart lisinopril and discussed blood pressure targets, lisinopril will be also protective in other ways  Check fasting lipids on the next visit, discussed need for control of triglycerides and HDL  Patient Instructions  Restart Actos  24 Toujeo and keep am sugar < 120  Check blood sugars on waking up  3/7 days  Also check blood sugars about 2 hours after a meal and do this after different meals by rotation  Recommended blood sugar levels on waking up is 90-130 and about 2 hours after meal is 130-160  Please bring your blood sugar monitor to each visit, thank you  Keep Lisinopril same   Total visit time for evaluation and management of multiple problems and counseling =25 minutes   Elayne Snare 02/13/2018, 9:06 PM

## 2018-02-13 NOTE — Patient Instructions (Addendum)
Restart Actos  24 Toujeo and keep am sugar < 120  Check blood sugars on waking up  3/7 days  Also check blood sugars about 2 hours after a meal and do this after different meals by rotation  Recommended blood sugar levels on waking up is 90-130 and about 2 hours after meal is 130-160  Please bring your blood sugar monitor to each visit, thank you  Keep Lisinopril same

## 2018-03-11 ENCOUNTER — Other Ambulatory Visit: Payer: Self-pay | Admitting: Endocrinology

## 2018-03-12 ENCOUNTER — Other Ambulatory Visit: Payer: Self-pay | Admitting: Endocrinology

## 2018-03-29 ENCOUNTER — Other Ambulatory Visit: Payer: Self-pay | Admitting: Endocrinology

## 2018-04-24 ENCOUNTER — Ambulatory Visit: Payer: 59 | Admitting: Internal Medicine

## 2018-04-24 ENCOUNTER — Encounter: Payer: Self-pay | Admitting: Internal Medicine

## 2018-04-24 VITALS — BP 148/92 | HR 98 | Temp 98.4°F | Ht 64.0 in | Wt 215.0 lb

## 2018-04-24 DIAGNOSIS — Z794 Long term (current) use of insulin: Secondary | ICD-10-CM

## 2018-04-24 DIAGNOSIS — Z0001 Encounter for general adult medical examination with abnormal findings: Secondary | ICD-10-CM

## 2018-04-24 DIAGNOSIS — M48062 Spinal stenosis, lumbar region with neurogenic claudication: Secondary | ICD-10-CM | POA: Diagnosis not present

## 2018-04-24 DIAGNOSIS — E119 Type 2 diabetes mellitus without complications: Secondary | ICD-10-CM | POA: Diagnosis not present

## 2018-04-24 DIAGNOSIS — E785 Hyperlipidemia, unspecified: Secondary | ICD-10-CM | POA: Diagnosis not present

## 2018-04-24 DIAGNOSIS — I10 Essential (primary) hypertension: Secondary | ICD-10-CM

## 2018-04-24 DIAGNOSIS — G9519 Other vascular myelopathies: Secondary | ICD-10-CM

## 2018-04-24 MED ORDER — ROSUVASTATIN CALCIUM 40 MG PO TABS: 40.0000 mg | ORAL_TABLET | Freq: Every day | ORAL | 3 refills | Status: DC

## 2018-04-24 MED ORDER — SERTRALINE HCL 100 MG PO TABS: 100.0000 mg | ORAL_TABLET | Freq: Two times a day (BID) | ORAL | 3 refills | Status: DC

## 2018-04-24 MED ORDER — LISINOPRIL 40 MG PO TABS: ORAL_TABLET | ORAL | 3 refills | Status: DC

## 2018-04-24 MED ORDER — ALPRAZOLAM 0.25 MG PO TABS: 0.2500 mg | ORAL_TABLET | Freq: Two times a day (BID) | ORAL | 3 refills | Status: DC | PRN

## 2018-04-24 NOTE — Assessment & Plan Note (Addendum)
Uncontrolled,  To add crestor 40 qd, o/w stable overall by history and exam, recent data reviewed with pt, and pt to continue medical treatment as before,  to f/u any worsening symptoms or concerns

## 2018-04-24 NOTE — Progress Notes (Signed)
Subjective:    Patient ID: Sherry White, female    DOB: 1961-04-16, 57 y.o.   MRN: [id number]  HPI  Here for wellness and f/u;  Overall doing ok;  Pt denies Chest pain, worsening SOB, DOE, wheezing, orthopnea, PND, worsening LE edema, palpitations, dizziness or syncope.  Pt denies neurological change such as new headache, facial or extremity weakness.  Pt denies polydipsia, polyuria, or low sugar symptoms. Pt states overall good compliance with treatment and medications, good tolerability, and has been trying to follow appropriate diet.  Pt denies worsening depressive symptoms, suicidal ideation or panic. No fever, night sweats, wt loss, loss of appetite, or other constitutional symptoms.  Pt states good ability with ADL's, has low fall risk, home safety reviewed and adequate, no other significant changes in hearing or vision, and only occasionally active with exercise.  Quit working 2 yrs ago to get her health better under control.  Has been out of ACEI, xanax, zoloft recently however.  Pt continues to have recurring LBP without change in severity, bowel or bladder change, fever, wt loss,  worsening LE pain/numbness/weakness, gait change or falls, but worse overall with exercise, now mod to severe, asks for MRI and surgical eval Past Medical History:  Diagnosis Date  . ANXIETY 06/17/2007  . ASTHMA 06/17/2007  . CHEST PAIN 06/17/2007  . DEPRESSION 06/17/2007  . Diabetes mellitus without complication ([place])   . Disc disease, degenerative, lumbar or lumbosacral   . Glucose intolerance (impaired glucose tolerance)   . GOITER, MULTINODULAR 10/22/2008  . Hyperlipidemia 07/13/2011  . HYPERSOMNIA 12/06/2009  . HYPERTENSION 06/17/2007  . HYPERTRIGLYCERIDEMIA 06/17/2007  . Impaired glucose tolerance 11/15/2010  . Left lateral epicondylitis 11/15/2010  . MVP (mitral valve prolapse)   . NASH (nonalcoholic steatohepatitis) 06/08/2011  . OSA (obstructive sleep apnea)   . Right carpal tunnel syndrome  11/15/2010  . Sleep apnea   . THYROID NODULE, RIGHT 10/19/2008  . TIA (transient ischemic attack)    2 TIA-20 years ago   Past Surgical History:  Procedure Laterality Date  . BIOPSY BREAST  june 2010   [facility]  . CHOLECYSTECTOMY    . INGUINAL HERNIA REPAIR     1968/1969 BIL  . WISDOM TOOTH EXTRACTION      reports that she has never smoked. She has never used smokeless tobacco. She reports that she does not drink alcohol or use drugs. family history includes Alcohol abuse in her unknown relative; Breast cancer in her mother; COPD in her mother; Colon polyps in her mother; Coronary artery disease in her father; Diabetes in her father and mother; Emphysema in her maternal grandmother and mother; Hypertension in her father and mother; Thyroid disease in her sister; Tuberculosis in her unknown relative. Allergies  Allergen Reactions  . Amoxicillin Rash   Current Outpatient Medications on File Prior to Visit  Medication Sig Dispense Refill  . aspirin 81 MG EC tablet Take 81 mg by mouth daily.      . fenofibrate 160 MG tablet Take 1 tablet by mouth  daily 90 tablet 3  . Fluticasone-Salmeterol (ADVAIR DISKUS) 250-50 MCG/DOSE AEPB Inhale 1 puff into the lungs 2 (two) times daily. 1 each 11  . glucose blood (FREESTYLE LITE) test strip 1 each by Other route as needed for other. Use as instructed to check blood sugar. 100 each 3  . insulin lispro (HUMALOG KWIKPEN) 100 UNIT/ML KiwkPen INJECT 6 UNITS SUBCUTANEOUSLY BEFORE BREAKFAST, 8 UNITS AT LUNCH, AND 8-10 UNITS BEFORE  DINNER 6 pen 2  . Insulin Pen Needle (PEN NEEDLES) 32G X 4 MM MISC 1 each by Does not apply route daily. Use with Novolog and Trulicity 588 each 1  . Lancets (FREESTYLE) lancets 1 each by Other route as needed for other. Use as instructed to stick finger to check blood sugar. 100 each 3  . Multiple Vitamin (MULTIVITAMIN WITH MINERALS) TABS tablet Take 1 tablet by mouth daily.    . pioglitazone (ACTOS) 15  MG tablet TAKE 1 TABLET BY MOUTH EVERY DAY 30 tablet 3  . PROAIR HFA 108 (90 Base) MCG/ACT inhaler INHALE 2 PUFFS INTO THE LUNGS EVERY 6 (SIX) HOURS AS NEEDED FOR WHEEZING OR SHORTNESS OF BREATH. 25.5 Inhaler 0  . triamterene-hydrochlorothiazide (MAXZIDE-25) 37.5-25 MG tablet TAKE 1/2 TABLET DAILY 15 tablet 3  . TRULICITY 5.02 DX/4.1OI SOPN INJECT IN THE ABDOMINAL SKIN AS DIRECTED ONCE A WEEK. NEEDS TO MAKE APPOINTMENT FOR REFILLS 4 pen 2   No current facility-administered medications on file prior to visit.    Review of Systems Constitutional: Negative for other unusual diaphoresis, sweats, appetite or weight changes HENT: Negative for other worsening hearing loss, ear pain, facial swelling, mouth sores or neck stiffness.   Eyes: Negative for other worsening pain, redness or other visual disturbance.  Respiratory: Negative for other stridor or swelling Cardiovascular: Negative for other palpitations or other chest pain  Gastrointestinal: Negative for worsening diarrhea or loose stools, blood in stool, distention or other pain Genitourinary: Negative for hematuria, flank pain or other change in urine volume.  Musculoskeletal: Negative for myalgias or other joint swelling.  Skin: Negative for other color change, or other wound or worsening drainage.  Neurological: Negative for other syncope or numbness. Hematological: Negative for other adenopathy or swelling Psychiatric/Behavioral: Negative for hallucinations, other worsening agitation, SI, self-injury, or new decreased concentration All other system neg per pt    Objective:   Physical Exam BP (!) 148/92   Pulse 98   Temp 98.4 F (36.9 C) (Oral)   Ht 5\' 4"  (1.626 m)   Wt 215 lb (97.5 kg)   LMP 06/11/2012   SpO2 97%   BMI 36.90 kg/m  VS noted,  Constitutional: Pt appears in NAD HENT: Head: NCAT.  Right Ear: External ear normal.  Left Ear: External ear normal.  Eyes: . Pupils are equal, round, and reactive to light. Conjunctivae  and EOM are normal Nose: without d/c or deformity Neck: Neck supple. Gross normal ROM Cardiovascular: Normal rate and regular rhythm.   Pulmonary/Chest: Effort normal and breath sounds without rales or wheezing.  Abd:  Soft, NT, ND, + BS, no organomegaly Neurological: Pt is alert. At baseline orientation, motor grossly intact Skin: Skin is warm. No rashes, other new lesions, no LE edema Psychiatric: Pt behavior is normal without agitation  No other exam findings  Lab Results  Component Value Date   WBC 6.2 01/03/2013   HGB 14.1 01/03/2013   HCT 41.0 01/03/2013   PLT 197.0 01/03/2013   GLUCOSE 109 (H) 11/07/2017   CHOL 236 (H) 09/18/2017   TRIG (H) 09/18/2017    418.0 Triglyceride is over 400; calculations on Lipids are invalid.   HDL 37.60 (L) 09/18/2017   LDLDIRECT 130.0 09/18/2017   LDLCALC  06/05/2007    91        Total Cholesterol/HDL:CHD Risk Coronary Heart Disease Risk Table                     Men  Women  1/2 Average Risk   3.4   3.3  Average Risk       5.0   4.4  2 X Average Risk   9.6   7.1  3 X Average Risk  23.4   11.0        Use the calculated Patient Ratio above and the CHD Risk Table to determine the patient's CHD Risk.        ATP III CLASSIFICATION (LDL):  <100     mg/dL   Optimal  100-129  mg/dL   Near or Above                    Optimal  130-159  mg/dL   Borderline  160-189  mg/dL   High  >190     mg/dL   Very High   ALT 28 11/07/2017   AST 26 11/07/2017   NA 136 11/07/2017   K 4.0 11/07/2017   CL 102 11/07/2017   CREATININE 0.88 11/07/2017   BUN 11 11/07/2017   CO2 28 11/07/2017   TSH 2.91 11/07/2017   INR 0.9 06/04/2007   HGBA1C 6.8 02/13/2018   MICROALBUR 3.0 (H) 01/19/2017   Declines further labs     Assessment & Plan:

## 2018-04-24 NOTE — Assessment & Plan Note (Signed)
stable overall by history and exam, recent data reviewed with pt, and pt to continue medical treatment as before,  to f/u any worsening symptoms or concerns  

## 2018-04-24 NOTE — Assessment & Plan Note (Signed)

## 2018-04-24 NOTE — Patient Instructions (Signed)
Please take all new medication as prescribed - the crestor cholesterol medication  You will be contacted regarding the referral for: MRI for the lower back  Please continue all other medications as before, and refills have been done if requested.  Please have the pharmacy call with any other refills you may need.  Please continue your efforts at being more active, low cholesterol diet, and weight control.  You are otherwise up to date with prevention measures today.  Please keep your appointments with your specialists as you may have planned  We can hold on further lab testing for now  Please return in 6 months, or sooner if needed

## 2018-04-24 NOTE — Assessment & Plan Note (Addendum)
With worsening symptoms, for MRI ls spine  In addition to the time spent performing CPE, I spent an additional 25 minutes face to face,in which greater than 50% of this time was spent in counseling and coordination of care for patient's acute illness as documented, including the differential dx, treatment, further evaluation and other management of neurogenic claudication, HTn, DM, HLD,

## 2018-04-28 ENCOUNTER — Other Ambulatory Visit: Payer: Self-pay | Admitting: Endocrinology

## 2018-05-07 ENCOUNTER — Encounter: Payer: Self-pay | Admitting: Internal Medicine

## 2018-05-07 MED ORDER — ALPRAZOLAM 0.25 MG PO TABS: 0.2500 mg | ORAL_TABLET | Freq: Two times a day (BID) | ORAL | 3 refills | Status: DC | PRN

## 2018-05-07 NOTE — Telephone Encounter (Signed)
Done erx 

## 2018-05-10 ENCOUNTER — Other Ambulatory Visit: Payer: 59

## 2018-05-12 ENCOUNTER — Ambulatory Visit
Admission: RE | Admit: 2018-05-12 | Discharge: 2018-05-12 | Disposition: A | Discharge status: Home / Self Care | Payer: 59 | Source: Ambulatory Visit | Attending: Internal Medicine | Admitting: Internal Medicine

## 2018-05-12 DIAGNOSIS — M5137 Other intervertebral disc degeneration, lumbosacral region: Secondary | ICD-10-CM | POA: Diagnosis not present

## 2018-05-12 DIAGNOSIS — M48062 Spinal stenosis, lumbar region with neurogenic claudication: Secondary | ICD-10-CM

## 2018-05-12 DIAGNOSIS — G9519 Other vascular myelopathies: Secondary | ICD-10-CM

## 2018-05-13 ENCOUNTER — Ambulatory Visit: Payer: 59 | Admitting: Endocrinology

## 2018-05-14 ENCOUNTER — Telehealth: Payer: Self-pay

## 2018-05-14 NOTE — Telephone Encounter (Signed)
Pt has viewed results via MyChart  

## 2018-05-14 NOTE — Telephone Encounter (Signed)
-----   Message from Biagio Borg, MD sent at 05/14/2018 12:29 PM EST ----- Left message on MyChart, pt to cont same tx except  The test results show that your current treatment is OK, except the MRI does show several lower lumbar levels of arthritis primarily, as well as mild possible nerve pinching in several places, and minor disc herniation as well.  We can refer you to orthopedic if you like, or you could make an appt with Sports Medicine in this office as well.  I will have the office contact you about this.    Sehaj Kolden to please inform pt, and ask if she needs orthopedic referral vs Sports Medicine in this office

## 2018-05-16 ENCOUNTER — Other Ambulatory Visit: Payer: Self-pay | Admitting: Internal Medicine

## 2018-05-30 LAB — HM DIABETES EYE EXAM

## 2018-05-31 ENCOUNTER — Telehealth: Payer: Self-pay

## 2018-05-31 NOTE — Telephone Encounter (Signed)
Called pt and left detailed voicemail informing her that her appt has been canceled due to a scheduling error. Pt was requested to call back as soon as she can and schedule a new appt.

## 2018-06-07 ENCOUNTER — Ambulatory Visit: Payer: 59 | Admitting: Endocrinology

## 2018-06-21 ENCOUNTER — Telehealth: Payer: Self-pay

## 2018-06-21 NOTE — Telephone Encounter (Signed)
She has canceled her appointment, please find out when she is coming back

## 2018-06-21 NOTE — Telephone Encounter (Signed)
Received fax from pt's pharmacy stating that her insurance was recently changed, and her new plan does not cover Toujeo Solostar 330unit/ml. Insurance prefers [place]. Would you like to switch to [place] or do PA for Toujeo?

## 2018-06-21 NOTE — Telephone Encounter (Signed)
Sent MyChart message asking pt.

## 2018-06-30 ENCOUNTER — Other Ambulatory Visit: Payer: Self-pay | Admitting: Endocrinology

## 2018-07-01 ENCOUNTER — Other Ambulatory Visit: Payer: Self-pay | Admitting: Endocrinology

## 2018-07-01 NOTE — Telephone Encounter (Signed)
Not on pt's medication list. Do you want me to fill this? If so, do you want same directions as Humalog?

## 2018-07-01 NOTE — Telephone Encounter (Signed)
Attempted to call pt and gather additional information as to why she is taking or requesting Fiasp. Left voicemail requesting a call back.

## 2018-07-01 NOTE — Telephone Encounter (Signed)
Do not see why she is on this.  If Humalog is not covered she can go to NovoLog same dose.  Also needs to do lab before her follow-up

## 2018-07-02 ENCOUNTER — Other Ambulatory Visit: Payer: Self-pay | Admitting: Endocrinology

## 2018-07-02 ENCOUNTER — Telehealth: Payer: Self-pay | Admitting: Endocrinology

## 2018-07-02 ENCOUNTER — Other Ambulatory Visit (INDEPENDENT_AMBULATORY_CARE_PROVIDER_SITE_OTHER): Payer: 59

## 2018-07-02 DIAGNOSIS — Z794 Long term (current) use of insulin: Secondary | ICD-10-CM

## 2018-07-02 DIAGNOSIS — E1165 Type 2 diabetes mellitus with hyperglycemia: Secondary | ICD-10-CM

## 2018-07-02 LAB — HEMOGLOBIN A1C: Hgb A1c MFr Bld: 8.2 % — ABNORMAL HIGH (ref 4.6–6.5)

## 2018-07-02 LAB — LIPID PANEL
Cholesterol: 197 mg/dL (ref 0–200)
HDL: 37.9 mg/dL — ABNORMAL LOW (ref >39.00)
NonHDL: 158.79
Total CHOL/HDL Ratio: 5
Triglycerides: 266 mg/dL — ABNORMAL HIGH (ref 0.0–149.0)
VLDL: 53.2 mg/dL — ABNORMAL HIGH (ref 0.0–40.0)

## 2018-07-02 LAB — COMPREHENSIVE METABOLIC PANEL
ALT: 31 U/L (ref 0–35)
AST: 19 U/L (ref 0–37)
Albumin: 4.3 g/dL (ref 3.5–5.2)
Alkaline Phosphatase: 111 U/L (ref 39–117)
BUN: 13 mg/dL (ref 6–23)
CO2: 24 mEq/L (ref 19–32)
Calcium: 9.5 mg/dL (ref 8.4–10.5)
Chloride: 106 mEq/L (ref 96–112)
Creatinine, Ser: 0.73 mg/dL (ref 0.40–1.20)
GFR: 82.73 mL/min (ref >60.00)
Glucose, Bld: 160 mg/dL — ABNORMAL HIGH (ref 70–99)
Potassium: 4.7 mEq/L (ref 3.5–5.1)
Sodium: 139 mEq/L (ref 135–145)
Total Bilirubin: 0.5 mg/dL (ref 0.2–1.2)
Total Protein: 7.4 g/dL (ref 6.0–8.3)

## 2018-07-02 LAB — MICROALBUMIN / CREATININE URINE RATIO
Creatinine,U: 159.6 mg/dL
Microalb Creat Ratio: 5.1 mg/g (ref 0.0–30.0)
Microalb, Ur: 8.1 mg/dL — ABNORMAL HIGH (ref 0.0–1.9)

## 2018-07-02 LAB — LDL CHOLESTEROL, DIRECT: Direct LDL: 105 mg/dL

## 2018-07-02 NOTE — Telephone Encounter (Signed)
Patient states she is returning Noah's call. Patient's ph# [phone].

## 2018-07-03 ENCOUNTER — Other Ambulatory Visit: Payer: Self-pay | Admitting: Endocrinology

## 2018-07-03 MED ORDER — INSULIN ASPART 100 UNIT/ML FLEXPEN: PEN_INJECTOR | SUBCUTANEOUS | 1 refills | Status: DC

## 2018-07-03 NOTE — Telephone Encounter (Signed)
Please see previous note regarding clarification of why she is not taking Humalog

## 2018-07-03 NOTE — Telephone Encounter (Signed)
Multiple attempts have been made to contact patient. At this time, she has not returned the call after this once instance.

## 2018-07-03 NOTE — Telephone Encounter (Signed)
Ok to send [place] and if so what will dosage be please advise

## 2018-07-08 ENCOUNTER — Ambulatory Visit (INDEPENDENT_AMBULATORY_CARE_PROVIDER_SITE_OTHER): Payer: 59 | Admitting: Endocrinology

## 2018-07-08 ENCOUNTER — Encounter: Payer: Self-pay | Admitting: Endocrinology

## 2018-07-08 VITALS — BP 148/90 | HR 87 | Temp 98.5°F | Ht 64.0 in | Wt 215.6 lb

## 2018-07-08 DIAGNOSIS — E782 Mixed hyperlipidemia: Secondary | ICD-10-CM | POA: Diagnosis not present

## 2018-07-08 DIAGNOSIS — I10 Essential (primary) hypertension: Secondary | ICD-10-CM | POA: Diagnosis not present

## 2018-07-08 DIAGNOSIS — Z794 Long term (current) use of insulin: Secondary | ICD-10-CM

## 2018-07-08 DIAGNOSIS — E1165 Type 2 diabetes mellitus with hyperglycemia: Secondary | ICD-10-CM | POA: Diagnosis not present

## 2018-07-08 MED ORDER — CHLORTHALIDONE 25 MG PO TABS: 25.0000 mg | ORAL_TABLET | Freq: Every day | ORAL | 3 refills | Status: DC

## 2018-07-08 MED ORDER — INSULIN DEGLUDEC 200 UNIT/ML ~~LOC~~ SOPN: 40.0000 [IU] | PEN_INJECTOR | Freq: Every day | SUBCUTANEOUS | 1 refills | Status: DC

## 2018-07-08 MED ORDER — DULAGLUTIDE 1.5 MG/0.5ML ~~LOC~~ SOAJ: SUBCUTANEOUS | 1 refills | Status: DC

## 2018-07-08 NOTE — Progress Notes (Signed)
Patient ID: Sherry White, female   DOB: Sep 28, 1960, 58 y.o.   MRN: [id number]   Reason for Appointment : Followup for Type 2 Diabetes  History of Present Illness           Diagnosis: Type 2 diabetes mellitus, date of diagnosis: 12/2012         Past history: For 2 months prior to diagnosis the patient had been having increased thirst and urination along with some blurred vision She was seen by her primary care physician and glucose was 593 an A1c of 11.4 Her urine showed small ketones also Because of her high sugars she was started on Levemir insulin. This was subsequently changed to NovoLog 29/51 for simplicity and postprandial control and 8/14.   RECENT history:   INSULIN regimen: TOUJEO 40 units for 2 wks and Humalog at lunch 10-12--at dinner 8/10  Non-insulin hypoglycemic drugs: Trulicity 8.84 mg weekly, on Actos 15 mg daily  Her last A1c was 8.2 compared to 6.8 in 9/19  Current management, blood sugar patterns and problems:  She had done well with her exercise and diet regimen on her last visit  However she has stopped exercising and also not planning her meals well  Even though she is taking her Trulicity and Actos she says that she has gone up on her insulin doses on her own because of higher blood sugars  However despite increasing her Toujeo by almost 20 units since her last visit her fasting readings are still mostly high  She has a few readings before lunch and bedtime and these appear to be relatively better  She had gained weight last year but this is about the same now  Glucose monitoring:  done about every other  day      Glucometer:   freestyle Libre   Blood Glucose readings from download below   PRE-MEAL Fasting Lunch Dinner Bedtime Overall  Glucose range: 1 49-309  85, 105  137    Mean/median:  185     164   POST-MEAL PC Breakfast PC Lunch PC Dinner  Glucose range:  :  126-230  Mean/median:   170    Self-care: DIET: Improved as  above  She has discussed diet with nurse educator in 2016   Meals:  2-3 meals per day.  Exercise: Was exercising at the gym 3/7, mostly walking                Wt Readings from Last 3 Encounters:  07/08/18 215 lb 9.6 oz (97.8 kg)  04/24/18 215 lb (97.5 kg)  02/13/18 210 lb (95.3 kg)     Lab Results  Component Value Date   HGBA1C 8.2 (H) 07/02/2018   HGBA1C 6.8 02/13/2018   HGBA1C 7.1 (H) 09/18/2017   Lab Results  Component Value Date   MICROALBUR 8.1 (H) 07/02/2018   [place]  06/05/2007    91        Total Cholesterol/HDL:CHD Risk Coronary Heart Disease Risk Table                     Men   Women  1/2 Average Risk   3.4   3.3  Average Risk       5.0   4.4  2 X Average Risk   9.6   7.1  3 X Average Risk  23.4   11.0        Use the calculated Patient Ratio above and the CHD Risk Table to  determine the patient's CHD Risk.        ATP III CLASSIFICATION (LDL):  <100     mg/dL   Optimal  100-129  mg/dL   Near or Above                    Optimal  130-159  mg/dL   Borderline  160-189  mg/dL   High  >190     mg/dL   Very High   CREATININE 0.73 07/02/2018     Allergies as of 07/08/2018      Reactions   Amoxicillin Rash      Medication List       Accurate as of July 08, 2018  3:56 PM. Always use your most recent med list.        ALPRAZolam 0.25 MG tablet Commonly known as:  XANAX Take 1 tablet (0.25 mg total) by mouth 2 (two) times daily as needed for anxiety.   aspirin 81 MG EC tablet Take 81 mg by mouth daily.   chlorthalidone 25 MG tablet Commonly known as:  HYGROTON Take 1 tablet (25 mg total) by mouth daily.   Dulaglutide 1.5 MG/0.5ML Sopn Commonly known as:  TRULICITY Inject weekly   fenofibrate 160 MG tablet Take 1 tablet by mouth  daily   Fluticasone-Salmeterol 250-50 MCG/DOSE Aepb Commonly known as:  ADVAIR DISKUS Inhale 1 puff into the lungs 2 (two) times daily.   freestyle lancets 1 each by Other route as needed for other. Use as  instructed to stick finger to check blood sugar.   glucose blood test strip Commonly known as:  FREESTYLE LITE 1 each by Other route as needed for other. Use as instructed to check blood sugar.   insulin aspart 100 UNIT/ML FlexPen Commonly known as:  NOVOLOG FLEXPEN 6-8 units before each meal as directed   Insulin Degludec 200 UNIT/ML Sopn Commonly known as:  TRESIBA FLEXTOUCH Inject 40 Units into the skin daily.   lisinopril 40 MG tablet Commonly known as:  PRINIVIL,ZESTRIL Take 1 tablet by mouth  daily   multivitamin with minerals Tabs tablet Take 1 tablet by mouth daily.   Pen Needles 32G X 4 MM Misc 1 each by Does not apply route daily. Use with Novolog and Trulicity   pioglitazone 15 MG tablet Commonly known as:  ACTOS TAKE 1 TABLET BY MOUTH EVERY DAY   PROAIR HFA 108 (90 Base) MCG/ACT inhaler Generic drug:  albuterol INHALE 2 PUFFS INTO THE LUNGS EVERY 6 (SIX) HOURS AS NEEDED FOR WHEEZING OR SHORTNESS OF BREATH.   rosuvastatin 40 MG tablet Commonly known as:  CRESTOR Take 1 tablet (40 mg total) by mouth daily.   sertraline 100 MG tablet Commonly known as:  ZOLOFT TAKE 1 TABLET BY MOUTH TWICE A DAY       Allergies:  Allergies  Allergen Reactions  . Amoxicillin Rash    Past Medical History:  Diagnosis Date  . ANXIETY 06/17/2007  . ASTHMA 06/17/2007  . CHEST PAIN 06/17/2007  . DEPRESSION 06/17/2007  . Diabetes mellitus without complication ([place])   . Disc disease, degenerative, lumbar or lumbosacral   . Glucose intolerance (impaired glucose tolerance)   . GOITER, MULTINODULAR 10/22/2008  . Hyperlipidemia 07/13/2011  . HYPERSOMNIA 12/06/2009  . HYPERTENSION 06/17/2007  . HYPERTRIGLYCERIDEMIA 06/17/2007  . Impaired glucose tolerance 11/15/2010  . Left lateral epicondylitis 11/15/2010  . MVP (mitral valve prolapse)   . NASH (nonalcoholic steatohepatitis) 06/08/2011  . OSA (obstructive sleep apnea)   .  Right carpal tunnel syndrome 11/15/2010  . Sleep apnea   .  THYROID NODULE, RIGHT 10/19/2008  . TIA (transient ischemic attack)    2 TIA-20 years ago    Past Surgical History:  Procedure Laterality Date  . BIOPSY BREAST  june 2010   [facility]  . CHOLECYSTECTOMY    . INGUINAL HERNIA REPAIR     1968/1969 BIL  . WISDOM TOOTH EXTRACTION      Family History  Problem Relation Age of Onset  . Hypertension Father   . Diabetes Father   . Coronary artery disease Father   . Hypertension Mother   . Diabetes Mother   . Breast cancer Mother   . Emphysema Mother   . COPD Mother   . Colon polyps Mother   . Thyroid disease Sister        unknown type  . Alcohol abuse Unknown        uncle  . Tuberculosis Unknown        aunt  . Emphysema Maternal Grandmother     Social History:  reports that she has never smoked. She has never used smokeless tobacco. She reports that she does not drink alcohol or use drugs.    Review of Systems       Lipids: She has increase in triglycerides and LDL  Currently not on fenofibrate, she stopped this on her own However fasting triglycerides are variably improved, previously over 400 Currently on 40 mg of Crestor with LDL 105 Also on Actos   Lab Results  Component Value Date   CHOL 197 07/02/2018   HDL 37.90 (L) 07/02/2018   LDLCALC  06/05/2007    91        Total Cholesterol/HDL:CHD Risk Coronary Heart Disease Risk Table                     Men   Women  1/2 Average Risk   3.4   3.3  Average Risk       5.0   4.4  2 X Average Risk   9.6   7.1  3 X Average Risk  23.4   11.0        Use the calculated Patient Ratio above and the CHD Risk Table to determine the patient's CHD Risk.        ATP III CLASSIFICATION (LDL):  <100     mg/dL   Optimal  100-129  mg/dL   Near or Above                    Optimal  130-159  mg/dL   Borderline  160-189  mg/dL   High  >190     mg/dL   Very High   LDLDIRECT 105.0 07/02/2018   TRIG 266.0 (H) 07/02/2018   CHOLHDL 5 77/82/4235      Complications: Hepatic steatosis, Diagnosis initially made in 2012 Liver functions are improved in previously with restarting Actos and somewhat better blood sugar control  She is back on her Actos now  Lab Results  Component Value Date   ALT 31 07/02/2018   HYPERTENSION:   Home BP recently about 140/110, not clear what instrument she is using  She is taking her lisinopril 40 mg as directed but not her triamterene HCT from 2019  BP Readings from Last 3 Encounters:  07/08/18 (!) 148/90  04/24/18 (!) 148/92  02/13/18 (!) 158/102    History of multinodular goiter: She had a  follow-up ultrasound which did not show any changes, she had requested a follow-up ultrasound   Physical Examination:  BP (!) 148/90   Pulse 87   Temp 98.5 F (36.9 C) (Oral)   Ht 5\' 4"  (1.626 m)   Wt 215 lb 9.6 oz (97.8 kg)   LMP 06/11/2012   SpO2 97%   BMI 37.01 kg/m      ASSESSMENT:  Diabetes type 2, insulin requiring  See history of present illness for detailed discussion of current diabetes management, blood sugar patterns and problems identified  Her A1c is significantly worse with A1c 8.2 compared to 6.8  This may be partly related to poor diet and exercise regimen Also appears to be needing more insulin now Not clear if she is benefiting from Actos with her diabetes control Also on low-dose Trulicity Her blood sugars are mostly high fasting however  HYPERTENSION: Blood pressure is high without her taking her Maxzide Her baseline potassium is 4.7 with normal renal function No microalbuminuria currently  Her lisinopril dose is 40 mg  Fatty liver: Currently ALT is back to normal Thing to consider medication   LIPIDS: Her fasting lipids are relatively better However triglycerides still over 200 She is reluctant to add a second medication although is a good candidate for Vascepa    PLAN:  Since her insurance will not cover Toujeo she will switch to [place] and discussed  that this should be more effective She will start with 40 units of the U-200 [place] She will adjusted further based on fasting readings More regular monitoring She will start back on the exercise 3 to 4 days/week Low-fat diet If she is able to tolerate 2 injections of the 0.27 mg Trulicity together she will switch to the 1.5 doses as this will help her control overall and promote weight loss Given co-pay cards  She will start chlorthalidone 25 mg daily for significant hypertension and continue with lisinopril  To discuss Vascepa on the next assessment of lipids if needed  Total visit time for evaluation and management of multiple problems and counseling =25 minutes  There are no Patient Instructions on file for this visit.     Elayne Snare 07/08/2018, 3:56 PM

## 2018-07-26 ENCOUNTER — Other Ambulatory Visit: Payer: Self-pay | Admitting: Endocrinology

## 2018-08-19 ENCOUNTER — Ambulatory Visit: Payer: 59 | Admitting: Endocrinology

## 2018-09-17 ENCOUNTER — Other Ambulatory Visit: Payer: Self-pay

## 2018-09-17 ENCOUNTER — Ambulatory Visit: Payer: 59

## 2018-09-17 ENCOUNTER — Other Ambulatory Visit (INDEPENDENT_AMBULATORY_CARE_PROVIDER_SITE_OTHER): Payer: 59

## 2018-09-17 DIAGNOSIS — Z794 Long term (current) use of insulin: Secondary | ICD-10-CM | POA: Diagnosis not present

## 2018-09-17 DIAGNOSIS — E1165 Type 2 diabetes mellitus with hyperglycemia: Secondary | ICD-10-CM | POA: Diagnosis not present

## 2018-09-17 LAB — POCT GLYCOSYLATED HEMOGLOBIN (HGB A1C): Hemoglobin A1C: 7 % — AB (ref 4.0–5.6)

## 2018-09-18 ENCOUNTER — Encounter: Payer: Self-pay | Admitting: Endocrinology

## 2018-09-18 ENCOUNTER — Ambulatory Visit (INDEPENDENT_AMBULATORY_CARE_PROVIDER_SITE_OTHER): Payer: 59 | Admitting: Endocrinology

## 2018-09-18 DIAGNOSIS — Z794 Long term (current) use of insulin: Secondary | ICD-10-CM | POA: Diagnosis not present

## 2018-09-18 DIAGNOSIS — E782 Mixed hyperlipidemia: Secondary | ICD-10-CM

## 2018-09-18 DIAGNOSIS — E1165 Type 2 diabetes mellitus with hyperglycemia: Secondary | ICD-10-CM

## 2018-09-18 NOTE — Progress Notes (Signed)
Patient ID: Sherry White, female   DOB: 06-Aug-1960, 58 y.o.   MRN: [id number]   Reason for Appointment : Followup for Type 2 Diabetes   Today's office visit was provided via telemedicine using video technique Explained to the patient and the the limitations of evaluation and management by telemedicine and the availability of in person appointments.  The patient understood the limitations and agreed to proceed. Patient also understood that the telehealth visit is billable. . Location of the patient: Home . Location of the provider: Office Only the patient and myself were participating in the encounter    History of Present Illness           Diagnosis: Type 2 diabetes mellitus, date of diagnosis: 12/2012         Past history: For 2 months prior to diagnosis the patient had been having increased thirst and urination along with some blurred vision She was seen by her primary care physician and glucose was 593 an A1c of 11.4 Her urine showed small ketones also Because of her high sugars she was started on Levemir insulin. This was subsequently changed to NovoLog 28/36 for simplicity and postprandial control and 8/14.   RECENT history:   INSULIN regimen: TRESIBA U-200 40 units daily, and Humalog at lunch 6-8 units, dinner 10 to 12 units  Non-insulin hypoglycemic drugs: Trulicity 1.5 mg weekly, Actos 15 mg daily  Her last A1c was 8.2 and is now 7%  Current management, blood sugar patterns and problems:  She has better blood sugar control with increasing her Trulicity up to 1.5 mg on her last visit  Also now taking [place] instead of [place]  With this FASTING readings are better and less fluctuating, previously averaging 185  She does not always eat breakfast and most of her blood sugars are done late morning or midday  Has done minimal blood sugar monitoring after lunch and dinner but these do not appear to be higher  She has not been motivated to do much  exercise lately especially with recent stay at home ordinance  Also her weight has not changed, has been about 215 previously also  She may be getting more home cooked meals lately and her lunch is lighter  Only once felt hypoglycemic late morning without any morning insulin  Glucose monitoring:  done about every other  day      Glucometer:   freestyle   Blood Glucose readings from download below   PRE-MEAL Fasting Lunch Dinner Bedtime Overall  Glucose range:  103-192      Mean/median:  145     134   POST-MEAL PC Breakfast PC Lunch PC Dinner  Glucose range:  69-1 33   98, 135  Mean/median:  120       PRE-MEAL Fasting Lunch Dinner Bedtime Overall  Glucose range: 1 49-309  85, 105  137    Mean/median:  185     164   POST-MEAL PC Breakfast PC Lunch PC Dinner  Glucose range:  :  126-230  Mean/median:   170    Self-care: DIET: Improved as above  She has discussed diet with nurse educator in 2016   Meals:  2-3 meals per day.  Exercise: Was exercising at the gym 3/7, mostly walking                Wt Readings from Last 3 Encounters:  07/08/18 215 lb 9.6 oz (97.8 kg)  04/24/18 215 lb (97.5 kg)  02/13/18 210  lb (95.3 kg)     Lab Results  Component Value Date   HGBA1C 7.0 (A) 09/17/2018   HGBA1C 8.2 (H) 07/02/2018   HGBA1C 6.8 02/13/2018   Lab Results  Component Value Date   MICROALBUR 8.1 (H) 07/02/2018   LDLCALC  06/05/2007    91        Total Cholesterol/HDL:CHD Risk Coronary Heart Disease Risk Table                     Men   Women  1/2 Average Risk   3.4   3.3  Average Risk       5.0   4.4  2 X Average Risk   9.6   7.1  3 X Average Risk  23.4   11.0        Use the calculated Patient Ratio above and the CHD Risk Table to determine the patient's CHD Risk.        ATP III CLASSIFICATION (LDL):  <100     mg/dL   Optimal  100-129  mg/dL   Near or Above                    Optimal  130-159  mg/dL   Borderline  160-189  mg/dL   High  >190     mg/dL   Very  High   CREATININE 0.73 07/02/2018     Allergies as of 09/18/2018      Reactions   Amoxicillin Rash      Medication List       Accurate as of September 18, 2018  2:59 PM. Always use your most recent med list.        ALPRAZolam 0.25 MG tablet Commonly known as:  XANAX Take 1 tablet (0.25 mg total) by mouth 2 (two) times daily as needed for anxiety.   aspirin 81 MG EC tablet Take 81 mg by mouth daily.   chlorthalidone 25 MG tablet Commonly known as:  HYGROTON Take 1 tablet (25 mg total) by mouth daily.   Dulaglutide 1.5 MG/0.5ML Sopn Commonly known as:  Trulicity Inject weekly   fenofibrate 160 MG tablet Take 1 tablet by mouth  daily   Fluticasone-Salmeterol 250-50 MCG/DOSE Aepb Commonly known as:  Advair Diskus Inhale 1 puff into the lungs 2 (two) times daily.   freestyle lancets 1 each by Other route as needed for other. Use as instructed to stick finger to check blood sugar.   glucose blood test strip Commonly known as:  FREESTYLE LITE 1 each by Other route as needed for other. Use as instructed to check blood sugar.   insulin aspart 100 UNIT/ML FlexPen Commonly known as:  NovoLOG FlexPen 6-8 units before each meal as directed   Insulin Degludec 200 UNIT/ML Sopn Commonly known as:  [place] FlexTouch Inject 40 Units into the skin daily.   lisinopril 40 MG tablet Commonly known as:  ZESTRIL Take 1 tablet by mouth  daily   multivitamin with minerals Tabs tablet Take 1 tablet by mouth daily.   Pen Needles 32G X 4 MM Misc 1 each by Does not apply route daily. Use with Novolog and Trulicity   pioglitazone 15 MG tablet Commonly known as:  ACTOS TAKE 1 TABLET BY MOUTH EVERY DAY   ProAir HFA 108 (90 Base) MCG/ACT inhaler Generic drug:  albuterol INHALE 2 PUFFS INTO THE LUNGS EVERY 6 (SIX) HOURS AS NEEDED FOR WHEEZING OR SHORTNESS OF BREATH.   rosuvastatin 40 MG tablet  Commonly known as:  Crestor Take 1 tablet (40 mg total) by mouth daily.   sertraline 100  MG tablet Commonly known as:  ZOLOFT TAKE 1 TABLET BY MOUTH TWICE A DAY       Allergies:  Allergies  Allergen Reactions  . Amoxicillin Rash    Past Medical History:  Diagnosis Date  . ANXIETY 06/17/2007  . ASTHMA 06/17/2007  . CHEST PAIN 06/17/2007  . DEPRESSION 06/17/2007  . Diabetes [place] ([place])   . Disc disease, degenerative, lumbar or lumbosacral   . Glucose intolerance (impaired glucose tolerance)   . GOITER, MULTINODULAR 10/22/2008  . Hyperlipidemia 07/13/2011  . HYPERSOMNIA 12/06/2009  . HYPERTENSION 06/17/2007  . HYPERTRIGLYCERIDEMIA 06/17/2007  . Impaired glucose tolerance 11/15/2010  . Left lateral epicondylitis 11/15/2010  . MVP (mitral valve prolapse)   . NASH (nonalcoholic steatohepatitis) 06/08/2011  . OSA (obstructive sleep apnea)   . Right carpal tunnel syndrome 11/15/2010  . Sleep apnea   . THYROID NODULE, RIGHT 10/19/2008  . TIA (transient ischemic attack)    2 TIA-20 years ago    Past Surgical History:  Procedure Laterality Date  . BIOPSY BREAST  june 2010   [facility]  . CHOLECYSTECTOMY    . INGUINAL HERNIA REPAIR     1968/1969 BIL  . WISDOM TOOTH EXTRACTION      Family History  Problem Relation Age of Onset  . Hypertension Father   . Diabetes Father   . Coronary artery disease Father   . Hypertension Mother   . Diabetes Mother   . Breast cancer Mother   . Emphysema Mother   . COPD Mother   . Colon polyps Mother   . Thyroid disease Sister        unknown type  . Alcohol abuse Unknown        uncle  . Tuberculosis Unknown        aunt  . Emphysema Maternal Grandmother     Social History:  reports that she has never smoked. She has never used smokeless tobacco. She reports that she does not drink alcohol or use drugs.    Review of Systems       Lipids: She has increase in triglycerides and LDL  The fasting triglycerides are variably improved, previously over 400 Currently on 40 mg of Crestor,  fenofibrate 160 mg  Needs follow-up labs, last LDL still over 100  Also on Actos   Lab Results  Component Value Date   CHOL 197 07/02/2018   HDL 37.90 (L) 07/02/2018   LDLCALC  06/05/2007    91        Total Cholesterol/HDL:CHD Risk Coronary Heart Disease Risk Table                     Men   Women  1/2 Average Risk   3.4   3.3  Average Risk       5.0   4.4  2 X Average Risk   9.6   7.1  3 X Average Risk  23.4   11.0        Use the calculated Patient Ratio above and the CHD Risk Table to determine the patient's CHD Risk.        ATP III CLASSIFICATION (LDL):  <100     mg/dL   Optimal  100-129  mg/dL   Near or Above  Optimal  130-159  mg/dL   Borderline  160-189  mg/dL   High  >190     mg/dL   Very High   LDLDIRECT 105.0 07/02/2018   TRIG 266.0 (H) 07/02/2018   CHOLHDL 5 50/27/7412     Complications: Hepatic steatosis, Diagnosis initially made in 2012 Liver functions are improved with better control of sugars and using Actos  Lab Results  Component Value Date   ALT 31 07/02/2018   HYPERTENSION:   Home BP prior to her last visit had been as high as 140/110  She is taking her lisinopril 40 mg as directed Was started on chlorthalidone 25 mg on her last visit in addition  She thinks her blood pressure has been mostly in the 878-676 range systolic and 72-09 diastolic now, has not had monitor compared to office reading  BP Readings from Last 3 Encounters:  07/08/18 (!) 148/90  04/24/18 (!) 148/92  02/13/18 (!) 158/102    History of multinodular goiter: She had a follow-up ultrasound which did not show any changes, she had requested a follow-up ultrasound   Physical Examination:  LMP 06/11/2012      ASSESSMENT:  Diabetes type 2, insulin requiring  See history of present illness for detailed discussion of current diabetes management, blood sugar patterns and problems identified  Her A1c is significantly better down to 7%  With  increasing her Trulicity and generally better meals her blood sugars are generally better Also fasting reading will be better with using [place] instead of [place] She has not done many readings after meals For now can continue the same regimen Have encouraged her to start walking or using indoor exercise videos since she is not going out much  HYPERTENSION: Blood pressure is improved with adding chlorthalidone Needs follow-up potassium   LIPIDS: Her fasting lipids need to be rechecked    PLAN:  As above, no change in medications Needs more readings after meals  There are no Patient Instructions on file for this visit.     Elayne Snare 09/18/2018, 2:59 PM

## 2018-09-23 ENCOUNTER — Other Ambulatory Visit: Payer: Self-pay | Admitting: Endocrinology

## 2018-10-02 ENCOUNTER — Other Ambulatory Visit: Payer: Self-pay | Admitting: Internal Medicine

## 2018-10-05 ENCOUNTER — Other Ambulatory Visit: Payer: Self-pay | Admitting: Endocrinology

## 2018-10-14 ENCOUNTER — Telehealth: Payer: Self-pay

## 2018-10-14 NOTE — Telephone Encounter (Signed)
PA initiated today through Cover My Meds for Trulicity. Will await insurance response re: approval/denial.  Sherry White (Key: [id number])    Your demographic data has been sent to [facility] successfully. They will respond shortly with your clinical questions.  If you need assistance, please chat with CoverMyMeds or call us at [phone].  Sherry White (Key: [id number])  Rx #: [id number]  Trulicity 1.5MG /0.5ML pen-injectors  Form Secondary school teacher PA Form  Created  1 day ago  Sent to Plan  3 minutes ago  Determination     Wait for Questions [employer] typically responds with questions in less than 15 minutes, but may take up to 24 hours.

## 2018-10-15 NOTE — Telephone Encounter (Signed)
Received notification from Cover My Meds that PA for Trulicity has been approved.  Sherry White (Key: [id number]) Rx #: [id number] Trulicity 1.5MG /0.5ML pen-injectors   Form Secondary school teacher PA Form Created 2 days ago Sent to Plan 22 hours ago Plan Response 22 hours ago Submit Clinical Questions 5 hours ago Determination Favorable 3 hours ago Message from [employer] PA request has been approved. Additional information will be provided in the approval communication. (Message 1145)

## 2018-10-15 NOTE — Telephone Encounter (Signed)
Logged in to Cover My Meds to verify Dr. Ronnie Derby info; Verification Code - [id number]  Marvene Staff (Key: [id number]) Rx #: [id number] Trulicity 1.5MG /0.5ML pen-injectors   Form Secondary school teacher PA Form Created 2 days ago Sent to Plan 17 hours ago Plan Response 17 hours ago Submit Clinical Questions 8 minutes ago Determination Wait for Determination Please wait for [employer] to return a determination.

## 2018-10-30 ENCOUNTER — Ambulatory Visit (INDEPENDENT_AMBULATORY_CARE_PROVIDER_SITE_OTHER): Payer: 59 | Admitting: Internal Medicine

## 2018-10-30 ENCOUNTER — Encounter: Payer: Self-pay | Admitting: Internal Medicine

## 2018-10-30 DIAGNOSIS — Z0000 Encounter for general adult medical examination without abnormal findings: Secondary | ICD-10-CM

## 2018-10-30 DIAGNOSIS — E119 Type 2 diabetes mellitus without complications: Secondary | ICD-10-CM | POA: Diagnosis not present

## 2018-10-30 DIAGNOSIS — E538 Deficiency of other specified B group vitamins: Secondary | ICD-10-CM | POA: Diagnosis not present

## 2018-10-30 DIAGNOSIS — E559 Vitamin D deficiency, unspecified: Secondary | ICD-10-CM

## 2018-10-30 DIAGNOSIS — Z794 Long term (current) use of insulin: Secondary | ICD-10-CM

## 2018-10-30 DIAGNOSIS — E611 Iron deficiency: Secondary | ICD-10-CM | POA: Diagnosis not present

## 2018-10-30 NOTE — Patient Instructions (Signed)
Please continue all other medications as before, and refills have been done if requested.  Please have the pharmacy call with any other refills you may need.  Please continue your efforts at being more active, low cholesterol diet, and weight control.  You are otherwise up to date with prevention measures today.  Please keep your appointments with your specialists as you may have planned  Please return in 1 year for your yearly visit, or sooner if needed, with Lab testing done 3-5 days before  

## 2018-10-30 NOTE — Progress Notes (Signed)
Patient ID: Sherry White, female   DOB: 1960/06/27, 58 y.o.   MRN: [id number]  Virtual Visit via Video Note  I connected with Howell Pringle on 10/30/18 at  2:00 PM EDT by a video enabled telemedicine application and verified that I am speaking with the correct person using two identifiers.  Location: Patient: at home Provider: at [facility]   I discussed the limitations of evaluation and management by telemedicine and the availability of in person appointments. The patient expressed understanding and agreed to proceed.  History of Present Illness: Here for wellness and f/u;  Overall doing ok;  Pt denies Chest pain, worsening SOB, DOE, wheezing, orthopnea, PND, worsening LE edema, palpitations, dizziness or syncope.  Pt denies neurological change such as new headache, facial or extremity weakness.  Pt denies polydipsia, polyuria, or low sugar symptoms. Pt states overall good compliance with treatment and medications, good tolerability, and has been trying to follow appropriate diet.  Pt denies worsening depressive symptoms, suicidal ideation or panic. No fever, night sweats, wt loss, loss of appetite, or other constitutional symptoms.  Pt states good ability with ADL's, has low fall risk, home safety reviewed and adequate, no other significant changes in hearing or vision, and only occasionally active with exercise.  No new complaints,  Using CPAP "most" of the time. Wt Readings from Last 3 Encounters:  07/08/18 215 lb 9.6 oz (97.8 kg)  04/24/18 215 lb (97.5 kg)  02/13/18 210 lb (95.3 kg)   Past Medical History:  Diagnosis Date  . ANXIETY 06/17/2007  . ASTHMA 06/17/2007  . CHEST PAIN 06/17/2007  . DEPRESSION 06/17/2007  . Diabetes mellitus without complication ([place])   . Disc disease, degenerative, lumbar or lumbosacral   . Glucose intolerance (impaired glucose tolerance)   . GOITER, MULTINODULAR 10/22/2008  . Hyperlipidemia 07/13/2011  . HYPERSOMNIA 12/06/2009  . HYPERTENSION 06/17/2007   . HYPERTRIGLYCERIDEMIA 06/17/2007  . Impaired glucose tolerance 11/15/2010  . Left lateral epicondylitis 11/15/2010  . MVP (mitral valve prolapse)   . NASH (nonalcoholic steatohepatitis) 06/08/2011  . OSA (obstructive sleep apnea)   . Right carpal tunnel syndrome 11/15/2010  . Sleep apnea   . THYROID NODULE, RIGHT 10/19/2008  . TIA (transient ischemic attack)    2 TIA-20 years ago   Past Surgical History:  Procedure Laterality Date  . BIOPSY BREAST  june 2010   [facility]  . CHOLECYSTECTOMY    . INGUINAL HERNIA REPAIR     1968/1969 BIL  . WISDOM TOOTH EXTRACTION      reports that she has never smoked. She has never used smokeless tobacco. She reports that she does not drink alcohol or use drugs. family history includes Alcohol abuse in her unknown relative; Breast cancer in her mother; COPD in her mother; Colon polyps in her mother; Coronary artery disease in her father; Diabetes in her father and mother; Emphysema in her maternal grandmother and mother; Hypertension in her father and mother; Thyroid disease in her sister; Tuberculosis in her unknown relative. Allergies  Allergen Reactions  . Amoxicillin Rash   Current Outpatient Medications on File Prior to Visit  Medication Sig Dispense Refill  . ALPRAZolam (XANAX) 0.25 MG tablet Take 1 tablet (0.25 mg total) by mouth 2 (two) times daily as needed for anxiety. 60 tablet 3  . aspirin 81 MG EC tablet Take 81 mg by mouth daily.      . chlorthalidone (HYGROTON) 25 MG tablet TAKE 1 TABLET BY MOUTH EVERY DAY 90 tablet  1  . Dulaglutide (TRULICITY) 1.5 UE/4.5WU SOPN Inject weekly 4 pen 1  . fenofibrate 160 MG tablet Take 1 tablet by mouth  daily 90 tablet 3  . Fluticasone-Salmeterol (ADVAIR DISKUS) 250-50 MCG/DOSE AEPB Inhale 1 puff into the lungs 2 (two) times daily. 1 each 11  . glucose blood (FREESTYLE LITE) test strip 1 each by Other route as needed for other. Use as instructed to check blood sugar. 100 each 3   . insulin aspart (NOVOLOG FLEXPEN) 100 UNIT/ML FlexPen 6-8 units before each meal as directed 15 mL 1  . Insulin Degludec (TRESIBA FLEXTOUCH) 200 UNIT/ML SOPN Inject 40 Units into the skin daily. 3 pen 1  . Insulin Pen Needle (PEN NEEDLES) 32G X 4 MM MISC 1 each by Does not apply route daily. Use with Novolog and Trulicity 981 each 1  . Lancets (FREESTYLE) lancets USE AS DIRECTED TO CHECK BLOOD SUGAR 100 each 3  . lisinopril (PRINIVIL,ZESTRIL) 40 MG tablet Take 1 tablet by mouth  daily 90 tablet 3  . Multiple Vitamin (MULTIVITAMIN WITH MINERALS) TABS tablet Take 1 tablet by mouth daily.    . pioglitazone (ACTOS) 15 MG tablet TAKE 1 TABLET BY MOUTH EVERY DAY 30 tablet 3  . rosuvastatin (CRESTOR) 40 MG tablet Take 1 tablet (40 mg total) by mouth daily. 90 tablet 3  . sertraline (ZOLOFT) 100 MG tablet TAKE 1 TABLET BY MOUTH TWICE A DAY 180 tablet 3  . VENTOLIN HFA 108 (90 Base) MCG/ACT inhaler INHALE 2 PUFFS INTO THE LUNGS EVERY 6 (SIX) HOURS AS NEEDED FOR WHEEZING OR SHORTNESS OF BREATH. 18 Inhaler 2   No current facility-administered medications on file prior to visit.    Observations/Objective: Alert, NAD, appropriate mood and affect, resps normal, cn 2-12 intact, moves all 4s, no visible rash or swelling Lab Results  Component Value Date   WBC 6.2 01/03/2013   HGB 14.1 01/03/2013   HCT 41.0 01/03/2013   PLT 197.0 01/03/2013   GLUCOSE 160 (H) 07/02/2018   CHOL 197 07/02/2018   TRIG 266.0 (H) 07/02/2018   HDL 37.90 (L) 07/02/2018   LDLDIRECT 105.0 07/02/2018   LDLCALC  06/05/2007    91        Total Cholesterol/HDL:CHD Risk Coronary Heart Disease Risk Table                     Men   Women  1/2 Average Risk   3.4   3.3  Average Risk       5.0   4.4  2 X Average Risk   9.6   7.1  3 X Average Risk  23.4   11.0        Use the calculated Patient Ratio above and the CHD Risk Table to determine the patient's CHD Risk.        ATP III CLASSIFICATION (LDL):  <100     mg/dL   Optimal   100-129  mg/dL   Near or Above                    Optimal  130-159  mg/dL   Borderline  160-189  mg/dL   High  >190     mg/dL   Very High   ALT 31 07/02/2018   AST 19 07/02/2018   NA 139 07/02/2018   K 4.7 07/02/2018   CL 106 07/02/2018   CREATININE 0.73 07/02/2018   BUN 13 07/02/2018   CO2 24 07/02/2018   TSH  2.91 11/07/2017   INR 0.9 06/04/2007   HGBA1C 7.0 (A) 09/17/2018   MICROALBUR 8.1 (H) 07/02/2018   Assessment and Plan: See notes  Follow Up Instructions: See notes   I discussed the assessment and treatment plan with the patient. The patient was provided an opportunity to ask questions and all were answered. The patient agreed with the plan and demonstrated an understanding of the instructions.   The patient was advised to call back or seek an in-person evaluation if the symptoms worsen or if the condition fails to improve as anticipated.  Cathlean Cower, MD

## 2018-10-30 NOTE — Assessment & Plan Note (Signed)

## 2018-10-30 NOTE — Assessment & Plan Note (Signed)
Follows closely with endo, with f/u later this month, stable

## 2018-11-06 ENCOUNTER — Other Ambulatory Visit: Payer: Self-pay | Admitting: Endocrinology

## 2018-11-15 ENCOUNTER — Other Ambulatory Visit: Payer: Self-pay | Admitting: Endocrinology

## 2018-11-18 ENCOUNTER — Other Ambulatory Visit (INDEPENDENT_AMBULATORY_CARE_PROVIDER_SITE_OTHER): Payer: 59

## 2018-11-18 ENCOUNTER — Other Ambulatory Visit: Payer: Self-pay

## 2018-11-18 DIAGNOSIS — Z794 Long term (current) use of insulin: Secondary | ICD-10-CM | POA: Diagnosis not present

## 2018-11-18 DIAGNOSIS — E782 Mixed hyperlipidemia: Secondary | ICD-10-CM

## 2018-11-18 DIAGNOSIS — E1165 Type 2 diabetes mellitus with hyperglycemia: Secondary | ICD-10-CM | POA: Diagnosis not present

## 2018-11-18 LAB — LDL CHOLESTEROL, DIRECT: Direct LDL: 90 mg/dL

## 2018-11-18 LAB — COMPREHENSIVE METABOLIC PANEL
ALT: 29 U/L (ref 0–35)
AST: 26 U/L (ref 0–37)
Albumin: 4.4 g/dL (ref 3.5–5.2)
Alkaline Phosphatase: 86 U/L (ref 39–117)
BUN: 15 mg/dL (ref 6–23)
CO2: 28 mEq/L (ref 19–32)
Calcium: 9.7 mg/dL (ref 8.4–10.5)
Chloride: 101 mEq/L (ref 96–112)
Creatinine, Ser: 0.74 mg/dL (ref 0.40–1.20)
GFR: 81.32 mL/min (ref >60.00)
Glucose, Bld: 114 mg/dL — ABNORMAL HIGH (ref 70–99)
Potassium: 3.7 mEq/L (ref 3.5–5.1)
Sodium: 136 mEq/L (ref 135–145)
Total Bilirubin: 0.5 mg/dL (ref 0.2–1.2)
Total Protein: 7.6 g/dL (ref 6.0–8.3)

## 2018-11-18 LAB — HEMOGLOBIN A1C: Hgb A1c MFr Bld: 6.9 % — ABNORMAL HIGH (ref 4.6–6.5)

## 2018-11-18 LAB — LIPID PANEL
Cholesterol: 159 mg/dL (ref 0–200)
HDL: 36.8 mg/dL — ABNORMAL LOW (ref >39.00)
NonHDL: 122.65
Total CHOL/HDL Ratio: 4
Triglycerides: 204 mg/dL — ABNORMAL HIGH (ref 0.0–149.0)
VLDL: 40.8 mg/dL — ABNORMAL HIGH (ref 0.0–40.0)

## 2018-11-19 LAB — FRUCTOSAMINE: Fructosamine: 248 umol/L (ref 0–285)

## 2018-11-20 ENCOUNTER — Other Ambulatory Visit: Payer: Self-pay

## 2018-11-20 ENCOUNTER — Ambulatory Visit (INDEPENDENT_AMBULATORY_CARE_PROVIDER_SITE_OTHER): Payer: 59 | Admitting: Endocrinology

## 2018-11-20 DIAGNOSIS — Z794 Long term (current) use of insulin: Secondary | ICD-10-CM | POA: Diagnosis not present

## 2018-11-20 DIAGNOSIS — E782 Mixed hyperlipidemia: Secondary | ICD-10-CM | POA: Diagnosis not present

## 2018-11-20 DIAGNOSIS — I10 Essential (primary) hypertension: Secondary | ICD-10-CM | POA: Diagnosis not present

## 2018-11-20 DIAGNOSIS — E119 Type 2 diabetes mellitus without complications: Secondary | ICD-10-CM

## 2018-11-20 NOTE — Progress Notes (Signed)
Patient ID: Sherry White, female   DOB: 1960-07-24, 58 y.o.   MRN: [id number]   Reason for Appointment : Followup for Type 2 Diabetes   Today's office visit was provided via telemedicine using audio technique Explained to the patient and the the limitations of evaluation and management by telemedicine and the availability of in person appointments.  The patient understood the limitations and agreed to proceed. Patient also understood that the telehealth visit is billable. . Location of the patient: Home . Location of the provider: Office Only the patient and myself were participating in the encounter    History of Present Illness           Diagnosis: Type 2 diabetes mellitus, date of diagnosis: 12/2012         Past history: For 2 months prior to diagnosis the patient had been having increased thirst and urination along with some blurred vision She was seen by her primary care physician and glucose was 593 an A1c of 11.4 Her urine showed small ketones also Because of her high sugars she was started on Levemir insulin. This was subsequently changed to NovoLog 61/95 for simplicity and postprandial control and 8/14.   RECENT history:   INSULIN regimen: TRESIBA U-200 40 units daily, and Humalog at lunch 6-8 units, dinner  12 units  Non-insulin hypoglycemic drugs: Trulicity 1.5 mg weekly, Actos 15 mg daily  Her last A1c was 7% and now 6.9  Current management, blood sugar patterns and problems:  She has not checked her fasting blood sugars but has done some readings after breakfast and lunch and only occasionally after dinner  Blood sugar readings were obtained by telephone review  With her current regimen most of her blood sugars are looking excellent with highest reading only 153 recently  Her insulin was not changed on her last visit but she thinks she is taking 12 units of Humalog at dinnertime instead of 10  However last night with eating only a salad with  protein her blood sugar went down to 86 after supper using the same insulin dose  No hypoglycemic symptoms during the day or overnight  She has done intermittent walking and not as consistently as before  However she thinks her weight is 212 at home, previously 215 in the office  She is generally consistent with taking all her insulin doses as prescribed as also Trulicity and Actos  Glucose monitoring:  done about every other  day      Glucometer:   freestyle   Blood Glucose readings from patient review of meter    PRE-MEAL Fasting Lunch Dinner Bedtime Overall  Glucose range:  Not checked   108    Mean/median:     ?   POST-MEAL PC Breakfast PC Lunch PC Dinner  Glucose range:  134-150  124-153  86, 127  Mean/median:       Previous readings:  PRE-MEAL Fasting Lunch Dinner Bedtime Overall  Glucose range:  103-192      Mean/median:  145     134   POST-MEAL PC Breakfast PC Lunch PC Dinner  Glucose range:  69-1 33   98, 135  Mean/median:  120        Self-care: DIET: Improved as above  She has discussed diet with nurse educator in 2016   Wt Readings from Last 3 Encounters:  07/08/18 215 lb 9.6 oz (97.8 kg)  04/24/18 215 lb (97.5 kg)  02/13/18 210 lb (95.3 kg)  Lab Results  Component Value Date   HGBA1C 6.9 (H) 11/18/2018   HGBA1C 7.0 (A) 09/17/2018   HGBA1C 8.2 (H) 07/02/2018   Lab Results  Component Value Date   MICROALBUR 8.1 (H) 07/02/2018   LDLCALC  06/05/2007    91        Total Cholesterol/HDL:CHD Risk Coronary Heart Disease Risk Table                     Men   Women  1/2 Average Risk   3.4   3.3  Average Risk       5.0   4.4  2 X Average Risk   9.6   7.1  3 X Average Risk  23.4   11.0        Use the calculated Patient Ratio above and the CHD Risk Table to determine the patient's CHD Risk.        ATP III CLASSIFICATION (LDL):  <100     mg/dL   Optimal  100-129  mg/dL   Near or Above                    Optimal  130-159  mg/dL   Borderline   160-189  mg/dL   High  >190     mg/dL   Very High   CREATININE 0.74 11/18/2018     Allergies as of 11/20/2018      Reactions   Amoxicillin Rash      Medication List       Accurate as of November 20, 2018 11:59 PM. If you have any questions, ask your nurse or doctor.        ALPRAZolam 0.25 MG tablet Commonly known as: XANAX Take 1 tablet (0.25 mg total) by mouth 2 (two) times daily as needed for anxiety.   aspirin 81 MG EC tablet Take 81 mg by mouth daily.   chlorthalidone 25 MG tablet Commonly known as: HYGROTON TAKE 1 TABLET BY MOUTH EVERY DAY   fenofibrate 160 MG tablet Take 1 tablet by mouth  daily   Fluticasone-Salmeterol 250-50 MCG/DOSE Aepb Commonly known as: Advair Diskus Inhale 1 puff into the lungs 2 (two) times daily.   freestyle lancets USE AS DIRECTED TO CHECK BLOOD SUGAR   glucose blood test strip Commonly known as: FREESTYLE LITE 1 each by Other route as needed for other. Use as instructed to check blood sugar.   insulin aspart 100 UNIT/ML FlexPen Commonly known as: NovoLOG FlexPen 6-8 units before each meal as directed   lisinopril 40 MG tablet Commonly known as: ZESTRIL Take 1 tablet by mouth  daily   multivitamin with minerals Tabs tablet Take 1 tablet by mouth daily.   Pen Needles 32G X 4 MM Misc 1 each by Does not apply route daily. Use with Novolog and Trulicity   pioglitazone 15 MG tablet Commonly known as: ACTOS TAKE 1 TABLET BY MOUTH EVERY DAY   rosuvastatin 40 MG tablet Commonly known as: Crestor Take 1 tablet (40 mg total) by mouth daily.   sertraline 100 MG tablet Commonly known as: ZOLOFT TAKE 1 TABLET BY MOUTH TWICE A DAY   Tresiba FlexTouch 200 UNIT/ML Sopn Generic drug: Insulin Degludec INJECT 40 UNITS INTO THE SKIN DAILY.   Trulicity 1.5 ZO/1.0RU Sopn Generic drug: Dulaglutide Inject 1.5mg  under the skin once weekly.   Ventolin HFA 108 (90 Base) MCG/ACT inhaler Generic drug: albuterol INHALE 2 PUFFS INTO THE  LUNGS EVERY 6 (SIX) HOURS AS  NEEDED FOR WHEEZING OR SHORTNESS OF BREATH.       Allergies:  Allergies  Allergen Reactions  . Amoxicillin Rash    Past Medical History:  Diagnosis Date  . ANXIETY 06/17/2007  . ASTHMA 06/17/2007  . CHEST PAIN 06/17/2007  . DEPRESSION 06/17/2007  . Diabetes mellitus without complication ([place])   . Disc disease, degenerative, lumbar or lumbosacral   . Glucose intolerance (impaired glucose tolerance)   . GOITER, MULTINODULAR 10/22/2008  . Hyperlipidemia 07/13/2011  . HYPERSOMNIA 12/06/2009  . HYPERTENSION 06/17/2007  . HYPERTRIGLYCERIDEMIA 06/17/2007  . Impaired glucose tolerance 11/15/2010  . Left lateral epicondylitis 11/15/2010  . MVP (mitral valve prolapse)   . NASH (nonalcoholic steatohepatitis) 06/08/2011  . OSA (obstructive sleep apnea)   . Right carpal tunnel syndrome 11/15/2010  . Sleep apnea   . THYROID NODULE, RIGHT 10/19/2008  . TIA (transient ischemic attack)    2 TIA-20 years ago    Past Surgical History:  Procedure Laterality Date  . BIOPSY BREAST  june 2010   [facility]./ [facility]  . CHOLECYSTECTOMY    . INGUINAL HERNIA REPAIR     1968/1969 BIL  . WISDOM TOOTH EXTRACTION      Family History  Problem Relation Age of Onset  . Hypertension Father   . Diabetes Father   . Coronary artery disease Father   . Hypertension Mother   . Diabetes Mother   . Breast cancer Mother   . Emphysema Mother   . COPD Mother   . Colon polyps Mother   . Thyroid disease Sister        unknown type  . Alcohol abuse Unknown        uncle  . Tuberculosis Unknown        aunt  . Emphysema Maternal Grandmother     Social History:  reports that she has never smoked. She has never used smokeless tobacco. She reports that she does not drink alcohol or use drugs.    Review of Systems       Lipids: She has increase in triglycerides and LDL  The fasting triglycerides are variably improved, previously over 400 and now around 200 Currently on  40 mg of Crestor, fenofibrate 160 mg and Actos LDL now below 90 She generally trying to watch her diet   Lab Results  Component Value Date   CHOL 159 11/18/2018   HDL 36.80 (L) 11/18/2018   LDLCALC  06/05/2007    91        Total Cholesterol/HDL:CHD Risk Coronary Heart Disease Risk Table                     Men   Women  1/2 Average Risk   3.4   3.3  Average Risk       5.0   4.4  2 X Average Risk   9.6   7.1  3 X Average Risk  23.4   11.0        Use the calculated Patient Ratio above and the CHD Risk Table to determine the patient's CHD Risk.        ATP III CLASSIFICATION (LDL):  <100     mg/dL   Optimal  100-129  mg/dL   Near or Above                    Optimal  130-159  mg/dL   Borderline  160-189  mg/dL   High  >190  mg/dL   Very High   LDLDIRECT 90.0 11/18/2018   TRIG 204.0 (H) 11/18/2018   CHOLHDL 4 82/50/5397     Complications: Hepatic steatosis, Diagnosis initially made in 2012 Liver functions are improved with better control of sugars and using Actos  Lab Results  Component Value Date   ALT 29 11/18/2018   HYPERTENSION:   Blood pressure has been difficult to control  She is taking her lisinopril 40 mg as directed Also taking chlorthalidone 25 mg  No recent readings at home  BP Readings from Last 3 Encounters:  07/08/18 (!) 148/90  04/24/18 (!) 148/92  02/13/18 (!) 158/102    History of multinodular goiter: She had a follow-up ultrasound which did not show any changes, she had requested a follow-up study   Physical Examination:  LMP 06/11/2012      ASSESSMENT:  Diabetes type 2, insulin requiring  See history of present illness for detailed discussion of current diabetes management, blood sugar patterns and problems identified  Her A1c is significantly better down to 7%  With increasing her Trulicity and generally better meals her blood sugars are generally better Also fasting reading will be better with using [place] instead of  [place] She has not done many readings after meals For now can continue the same regimen Have encouraged her to start walking or using indoor exercise videos since she is not going out much  HYPERTENSION: Blood pressure is not being checked and also needs to follow-up with her PCP   LIPIDS: Her lipids are improved although her labs were done before lunchtime in the afternoon Continue same regimen    PLAN:  She will periodically check fasting readings to help adjust her Tyler Aas Also discussed going up or down 2 to 4 units for her mealtime coverage based on her total meal size and carbohydrate intake If not eating carbohydrates such as a salad she will only take 8 units instead of 12 More regular walking She needs to check blood pressure regularly at home at least once a week and let us know if it is consistently high  There are no Patient Instructions on file for this visit.   Duration of telephone encounter =8 minutes  Elayne Snare 11/21/2018, 10:06 AM

## 2018-11-21 ENCOUNTER — Encounter: Payer: Self-pay | Admitting: Endocrinology

## 2018-11-27 ENCOUNTER — Other Ambulatory Visit: Payer: Self-pay | Admitting: Endocrinology

## 2018-12-28 ENCOUNTER — Other Ambulatory Visit: Payer: Self-pay | Admitting: Endocrinology

## 2019-01-14 ENCOUNTER — Other Ambulatory Visit: Payer: Self-pay | Admitting: Internal Medicine

## 2019-01-14 NOTE — Telephone Encounter (Signed)
Done erx 

## 2019-01-31 ENCOUNTER — Other Ambulatory Visit: Payer: Self-pay | Admitting: Endocrinology

## 2019-02-08 ENCOUNTER — Other Ambulatory Visit: Payer: Self-pay | Admitting: Endocrinology

## 2019-02-17 ENCOUNTER — Other Ambulatory Visit (INDEPENDENT_AMBULATORY_CARE_PROVIDER_SITE_OTHER): Payer: 59

## 2019-02-17 ENCOUNTER — Other Ambulatory Visit: Payer: Self-pay

## 2019-02-17 DIAGNOSIS — E119 Type 2 diabetes mellitus without complications: Secondary | ICD-10-CM | POA: Diagnosis not present

## 2019-02-17 DIAGNOSIS — Z794 Long term (current) use of insulin: Secondary | ICD-10-CM

## 2019-02-17 LAB — BASIC METABOLIC PANEL
BUN: 15 mg/dL (ref 6–23)
CO2: 27 mEq/L (ref 19–32)
Calcium: 9.6 mg/dL (ref 8.4–10.5)
Chloride: 104 mEq/L (ref 96–112)
Creatinine, Ser: 0.85 mg/dL (ref 0.40–1.20)
GFR: 69.24 mL/min (ref >60.00)
Glucose, Bld: 122 mg/dL — ABNORMAL HIGH (ref 70–99)
Potassium: 3.9 mEq/L (ref 3.5–5.1)
Sodium: 138 mEq/L (ref 135–145)

## 2019-02-17 LAB — HEMOGLOBIN A1C: Hgb A1c MFr Bld: 6.2 % (ref 4.6–6.5)

## 2019-02-20 ENCOUNTER — Ambulatory Visit: Payer: 59 | Admitting: Endocrinology

## 2019-03-04 ENCOUNTER — Other Ambulatory Visit: Payer: Self-pay | Admitting: Endocrinology

## 2019-03-06 ENCOUNTER — Other Ambulatory Visit: Payer: Self-pay | Admitting: Endocrinology

## 2019-03-10 ENCOUNTER — Ambulatory Visit (INDEPENDENT_AMBULATORY_CARE_PROVIDER_SITE_OTHER): Payer: 59 | Admitting: Endocrinology

## 2019-03-10 ENCOUNTER — Encounter: Payer: Self-pay | Admitting: Endocrinology

## 2019-03-10 ENCOUNTER — Other Ambulatory Visit: Payer: Self-pay

## 2019-03-10 VITALS — BP 118/80 | HR 80 | Ht 64.0 in | Wt 192.6 lb

## 2019-03-10 DIAGNOSIS — Z794 Long term (current) use of insulin: Secondary | ICD-10-CM | POA: Diagnosis not present

## 2019-03-10 DIAGNOSIS — E782 Mixed hyperlipidemia: Secondary | ICD-10-CM | POA: Diagnosis not present

## 2019-03-10 DIAGNOSIS — E119 Type 2 diabetes mellitus without complications: Secondary | ICD-10-CM | POA: Diagnosis not present

## 2019-03-10 LAB — HM PAP SMEAR: HM Pap smear: NEGATIVE

## 2019-03-10 NOTE — Progress Notes (Signed)
Patient ID: Sherry White, female   DOB: [birth year], 58 y.o.   MRN: [id number]   Reason for Appointment : Followup for Type 2 Diabetes     History of Present Illness           Diagnosis: Type 2 diabetes mellitus, date of diagnosis: 12/2012         Past history: For 2 months prior to diagnosis the patient had been having increased thirst and urination along with some blurred vision She was seen by her primary care physician and glucose was 593 an A1c of 11.4 Her urine showed small ketones also Because of her high sugars she was started on Levemir insulin. This was subsequently changed to NovoLog 0000000 for simplicity and postprandial control and 8/14.   RECENT history:    Non-insulin hypoglycemic drugs: Trulicity 1.5 mg weekly, Actos 15 mg daily  Her A1c is now 6.2  Current management, blood sugar patterns and problems:  She has stopped taking her insulin in August  She says that when she started consistently watching her diet with weight watchers program her blood sugars continue to decrease and she stopped insulin without notifying us  With cutting back on carbohydrates and total calories her weight has gone down 23 pounds since February  She has been trying to do some walking when she can, limited by joint pains but trying to do some every day  Continues to take Trulicity and Actos  She has checked only a few blood sugars at home but most of them are looking excellent with highest reading only 134 in the last 30 days  She thinks she is trying to get some protein at lunch and dinner, usually not eating breakfast   Glucose monitoring:  done about every other  day      Glucometer:   freestyle    Blood Glucose readings from download  Blood sugar range 87-134 over the last 30 days with average 106  Recent readings 120 or less, averaging about 115 in the morning  Previous readings:   PRE-MEAL Fasting Lunch Dinner Bedtime Overall  Glucose range:  Not  checked   108    Mean/median:     ?   POST-MEAL PC Breakfast PC Lunch PC Dinner  Glucose range:  134-150  124-153  86, 127  Mean/median:          Self-care: DIET: Improved as above  She has discussed diet with nurse educator in 2016   Wt Readings from Last 3 Encounters:  03/10/19 192 lb 9.6 oz (87.4 kg)  07/08/18 215 lb 9.6 oz (97.8 kg)  04/24/18 215 lb (97.5 kg)     Lab Results  Component Value Date   HGBA1C 6.2 02/17/2019   HGBA1C 6.9 (H) 11/18/2018   HGBA1C 7.0 (A) 09/17/2018   Lab Results  Component Value Date   MICROALBUR 8.1 (H) 07/02/2018   LDLCALC  06/05/2007    91        Total Cholesterol/HDL:CHD Risk Coronary Heart Disease Risk Table                     Men   Women  1/2 Average Risk   3.4   3.3  Average Risk       5.0   4.4  2 X Average Risk   9.6   7.1  3 X Average Risk  23.4   11.0        Use the calculated Patient Ratio above  and the CHD Risk Table to determine the patient's CHD Risk.        ATP III CLASSIFICATION (LDL):  <100     mg/dL   Optimal  100-129  mg/dL   Near or Above                    Optimal  130-159  mg/dL   Borderline  160-189  mg/dL   High  >190     mg/dL   Very High   CREATININE 0.85 02/17/2019     Allergies as of 03/10/2019      Reactions   Amoxicillin Rash      Medication List       Accurate as of March 10, 2019 11:07 AM. If you have any questions, ask your nurse or doctor.        STOP taking these medications   NovoLOG FlexPen 100 UNIT/ML FlexPen Generic drug: insulin aspart Stopped by: Elayne Snare, MD   Tyler Aas FlexTouch 200 UNIT/ML Sopn Generic drug: Insulin Degludec Stopped by: Elayne Snare, MD     TAKE these medications   ALPRAZolam 0.25 MG tablet Commonly known as: XANAX TAKE 1 TABLET(0.25 MG) BY MOUTH TWICE DAILY AS NEEDED FOR ANXIETY   aspirin 81 MG EC tablet Take 81 mg by mouth daily.   chlorthalidone 25 MG tablet Commonly known as: HYGROTON TAKE 1 TABLET BY MOUTH EVERY DAY   fenofibrate  160 MG tablet Take 1 tablet by mouth  daily   Fluticasone-Salmeterol 250-50 MCG/DOSE Aepb Commonly known as: Advair Diskus Inhale 1 puff into the lungs 2 (two) times daily.   freestyle lancets USE AS DIRECTED TO CHECK BLOOD SUGAR   FREESTYLE LITE test strip Generic drug: glucose blood USE AS DIRECTED AS NEEDED   lisinopril 40 MG tablet Commonly known as: ZESTRIL Take 1 tablet by mouth  daily   multivitamin with minerals Tabs tablet Take 1 tablet by mouth daily.   Pen Needles 32G X 4 MM Misc 1 each by Does not apply route daily. Use with Novolog and Trulicity   pioglitazone 15 MG tablet Commonly known as: ACTOS TAKE 1 TABLET BY MOUTH EVERY DAY   rosuvastatin 40 MG tablet Commonly known as: Crestor Take 1 tablet (40 mg total) by mouth daily.   sertraline 100 MG tablet Commonly known as: ZOLOFT TAKE 1 TABLET BY MOUTH TWICE A DAY   Trulicity 1.5 0000000 Sopn Generic drug: Dulaglutide INJECT 1.5MG  UNDER THE SKIN ONCE WEEKLY.   Ventolin HFA 108 (90 Base) MCG/ACT inhaler Generic drug: albuterol INHALE 2 PUFFS INTO THE LUNGS EVERY 6 (SIX) HOURS AS NEEDED FOR WHEEZING OR SHORTNESS OF BREATH.       Allergies:  Allergies  Allergen Reactions  . Amoxicillin Rash    Past Medical History:  Diagnosis Date  . ANXIETY 06/17/2007  . ASTHMA 06/17/2007  . CHEST PAIN 06/17/2007  . DEPRESSION 06/17/2007  . Diabetes mellitus without complication ([place])   . Disc disease, degenerative, lumbar or lumbosacral   . Glucose intolerance (impaired glucose tolerance)   . GOITER, MULTINODULAR 10/22/2008  . Hyperlipidemia 07/13/2011  . HYPERSOMNIA 12/06/2009  . HYPERTENSION 06/17/2007  . HYPERTRIGLYCERIDEMIA 06/17/2007  . Impaired glucose tolerance 11/15/2010  . Left lateral epicondylitis 11/15/2010  . MVP (mitral valve prolapse)   . NASH (nonalcoholic steatohepatitis) 06/08/2011  . OSA (obstructive sleep apnea)   . Right carpal tunnel syndrome 11/15/2010  . Sleep apnea   . THYROID NODULE,  RIGHT 10/19/2008  . TIA (transient ischemic attack)  2 TIA-20 years ago    Past Surgical History:  Procedure Laterality Date  . BIOPSY BREAST  june 2010   [facility]./ [facility]  . CHOLECYSTECTOMY    . INGUINAL HERNIA REPAIR     1968/1969 BIL  . WISDOM TOOTH EXTRACTION      Family History  Problem Relation Age of Onset  . Hypertension Father   . Diabetes Father   . Coronary artery disease Father   . Hypertension Mother   . Diabetes Mother   . Breast cancer Mother   . Emphysema Mother   . COPD Mother   . Colon polyps Mother   . Thyroid disease Sister        unknown type  . Alcohol abuse Unknown        uncle  . Tuberculosis Unknown        aunt  . Emphysema Maternal Grandmother     Social History:  reports that she has never smoked. She has never used smokeless tobacco. She reports that she does not drink alcohol or use drugs.    Review of Systems       Lipids: She has increase in triglycerides and LDL  Previous fasting triglycerides are variably improved, previously over 400 and were around 200 Currently on 40 mg of Crestor, fenofibrate 160 mg and Actos LDL now below 90 She now trying to watch her diet much better   Lab Results  Component Value Date   CHOL 159 11/18/2018   HDL 36.80 (L) 11/18/2018   LDLCALC  06/05/2007    91        Total Cholesterol/HDL:CHD Risk Coronary Heart Disease Risk Table                     Men   Women  1/2 Average Risk   3.4   3.3  Average Risk       5.0   4.4  2 X Average Risk   9.6   7.1  3 X Average Risk  23.4   11.0        Use the calculated Patient Ratio above and the CHD Risk Table to determine the patient's CHD Risk.        ATP III CLASSIFICATION (LDL):  <100     mg/dL   Optimal  100-129  mg/dL   Near or Above                    Optimal  130-159  mg/dL   Borderline  160-189  mg/dL   High  >190     mg/dL   Very High   LDLDIRECT 90.0 11/18/2018   TRIG 204.0 (H) 11/18/2018   CHOLHDL 4 [id number]      Complications: Hepatic steatosis, Diagnosis initially made in 2012 Liver functions are back to normal with better control of sugars and using Actos  Lab Results  Component Value Date   ALT 29 11/18/2018   HYPERTENSION:   She is taking her lisinopril 40 mg as prescribed Also taking chlorthalidone 25 mg  No recent readings at home Blood pressure has been previously not consistently controlled in the office  BP Readings from Last 3 Encounters:  03/10/19 118/80  07/08/18 (!) 148/90  04/24/18 (!) 148/92    History of multinodular goiter: She had a follow-up ultrasound which did not show any changes, she had requested a follow-up study   Physical Examination:  BP 118/80 (BP Location: Left Arm, Patient Position: Sitting,  Cuff Size: Normal)   Pulse 80   Ht 5\' 4"  (1.626 m)   Wt 192 lb 9.6 oz (87.4 kg)   LMP 06/11/2012   SpO2 98%   BMI 33.06 kg/m      ASSESSMENT:  Diabetes type 2, with obesity  See history of present illness for detailed discussion of current diabetes management, blood sugar patterns and problems identified  Her A1c is significantly better and now only 6.2  With her radically improving her diet and watching portions and carbohydrates with weight watchers she has lost 23 pounds On her own she has stopped insulin which she had been taking for quite some time Still on Trulicity and Actos Blood sugars at home are nearly normal, still mildly increased in the mornings compared to the rest of the day but highest 134 in the last 30 days  HYPERTENSION: Blood pressure is not being checked at home but appears to be much better now   LIPIDS: Her lipids can be checked again on the next visit, likely triglycerides should be better with weight loss    PLAN:  Encouraged her to continue her excellent diet and regular walking program long-term Continue to monitor blood sugars by rotation at different times  Follow-up in 4 months  There are no Patient  Instructions on file for this visit.    Elayne Snare 03/10/2019, 11:07 AM

## 2019-03-12 LAB — HM MAMMOGRAPHY

## 2019-04-13 ENCOUNTER — Other Ambulatory Visit: Payer: Self-pay | Admitting: Endocrinology

## 2019-05-10 ENCOUNTER — Other Ambulatory Visit: Payer: Self-pay

## 2019-05-10 ENCOUNTER — Ambulatory Visit (HOSPITAL_COMMUNITY)
Admission: EM | Admit: 2019-05-10 | Discharge: 2019-05-10 | Disposition: A | Discharge status: Home / Self Care | Payer: Managed Care, Other (non HMO) | Attending: Emergency Medicine | Admitting: Emergency Medicine

## 2019-05-10 ENCOUNTER — Ambulatory Visit (INDEPENDENT_AMBULATORY_CARE_PROVIDER_SITE_OTHER)
Admission: RE | Admit: 2019-05-10 | Discharge: 2019-05-10 | Disposition: A | Discharge status: Home / Self Care | Payer: Managed Care, Other (non HMO) | Source: Ambulatory Visit

## 2019-05-10 DIAGNOSIS — R519 Headache, unspecified: Secondary | ICD-10-CM

## 2019-05-10 DIAGNOSIS — R52 Pain, unspecified: Secondary | ICD-10-CM

## 2019-05-10 DIAGNOSIS — M791 Myalgia, unspecified site: Secondary | ICD-10-CM

## 2019-05-10 DIAGNOSIS — R112 Nausea with vomiting, unspecified: Secondary | ICD-10-CM

## 2019-05-10 DIAGNOSIS — Z20828 Contact with and (suspected) exposure to other viral communicable diseases: Secondary | ICD-10-CM | POA: Insufficient documentation

## 2019-05-10 NOTE — Discharge Instructions (Signed)
Tylenol as needed for headache or body aches.  Small frequent sips of fluids- Pedialyte, Gatorade, water, broth- to maintain hydration.   Bland diet as tolerated.  If any worsening or persistent symptoms please present to be seen in person either in our urgent care or with your primary care provider.  Please present today or tomorrow to one of our urgent care sites, stating that you have done a video visit and are there for a nurse visit for covid testing.  Self isolate until covid results are back and negative.  Will notify you by phone of any positive findings. Your negative results will be sent through your MyChart.

## 2019-05-10 NOTE — ED Provider Notes (Signed)
Virtual Visit via Video Note:  Sherry White  initiated request for Telemedicine visit with [facility]gent [facility] team. I connected with Sherry White  on 05/10/2019 at 1:38 PM  for a synchronized telemedicine visit using a video enabled HIPPA compliant telemedicine application. I verified that I am speaking with Sherry White  using two identifiers. Zigmund Gottron, NP  was physically located in a [facility] Urgent care site and Sherry White was located at a different location.   The limitations of evaluation and management by telemedicine as well as the availability of in-person appointments were discussed. Patient was informed that she  may incur a bill ( including co-pay) for this virtual visit encounter. Sherry White  expressed understanding and gave verbal consent to proceed with virtual visit.     History of Present Illness:Sherry White  is a 58 y.o. female presents with complaints of muscle pains, headache for the past few days. Vomited this morning. Hasn't eaten since. Concerned about covid-19. No fever. No significant nausea. No cough. Some runny nose. No sore throat. No ear pain. No loss of taste or smell. No known ill contacts. No diarrhea. No abdominal pain. Hasn't taken any medications for symptoms. States feels similar to other viral illness she has had in the past potentially. otherwise no previous similar. She is retired, minimal out of home activity. Husband/ daughter and fiance do reside with her, they have not been ill. History  Of dm, htn.   Past Medical History:  Diagnosis Date  . ANXIETY 06/17/2007  . ASTHMA 06/17/2007  . CHEST PAIN 06/17/2007  . DEPRESSION 06/17/2007  . Diabetes mellitus without complication ([place])   . Disc disease, degenerative, lumbar or lumbosacral   . Glucose intolerance (impaired glucose tolerance)   . GOITER, MULTINODULAR 10/22/2008  . Hyperlipidemia 07/13/2011  . HYPERSOMNIA 12/06/2009  . HYPERTENSION 06/17/2007   . HYPERTRIGLYCERIDEMIA 06/17/2007  . Impaired glucose tolerance 11/15/2010  . Left lateral epicondylitis 11/15/2010  . MVP (mitral valve prolapse)   . NASH (nonalcoholic steatohepatitis) 06/08/2011  . OSA (obstructive sleep apnea)   . Right carpal tunnel syndrome 11/15/2010  . Sleep apnea   . THYROID NODULE, RIGHT 10/19/2008  . TIA (transient ischemic attack)    2 TIA-20 years ago    Allergies  Allergen Reactions  . Amoxicillin Rash        Observations/Objective: Alert, oriented, non toxic in appearance. Clear coherent speech without difficulty. No increased work of breathing visualized.    Assessment and Plan: Non toxic at this time. No abdominal pain. No fevers. Patient concerned about covid-19, recommend to present for testing at our urgent care sites, with locations provided. Patient will present for a nurse visit for PCR testing. Will notify of any positive findings and if any changes to treatment are needed.  Isolation recommended. Return precautions provided. Patient verbalized understanding and agreeable to plan.    Follow Up Instructions:    I discussed the assessment and treatment plan with the patient. The patient was provided an opportunity to ask questions and all were answered. The patient agreed with the plan and demonstrated an understanding of the instructions.   The patient was advised to call back or seek an in-person evaluation if the symptoms worsen or if the condition fails to improve as anticipated.  I provided 15 minutes of non-face-to-face time during this encounter.    Zigmund Gottron, NP  05/10/2019 1:38 PM  Zigmund Gottron, NP 05/10/19 1349

## 2019-05-10 NOTE — ED Notes (Signed)
Pt here for nurse visit only, here for PCR covid testing.

## 2019-05-11 LAB — NOVEL CORONAVIRUS, NAA (HOSP ORDER, SEND-OUT TO REF LAB; TAT 18-24 HRS): SARS-CoV-2, NAA: NOT DETECTED

## 2019-05-14 ENCOUNTER — Other Ambulatory Visit: Payer: 59

## 2019-05-15 ENCOUNTER — Other Ambulatory Visit: Payer: Self-pay | Admitting: Internal Medicine

## 2019-05-16 ENCOUNTER — Other Ambulatory Visit: Payer: Self-pay | Admitting: Internal Medicine

## 2019-06-12 ENCOUNTER — Other Ambulatory Visit: Payer: Self-pay

## 2019-06-12 ENCOUNTER — Encounter: Payer: Self-pay | Admitting: Internal Medicine

## 2019-06-12 ENCOUNTER — Ambulatory Visit (INDEPENDENT_AMBULATORY_CARE_PROVIDER_SITE_OTHER): Payer: Managed Care, Other (non HMO) | Admitting: Internal Medicine

## 2019-06-12 VITALS — BP 150/88 | HR 77 | Temp 97.8°F | Ht 64.0 in | Wt 199.0 lb

## 2019-06-12 DIAGNOSIS — J4520 Mild intermittent asthma, uncomplicated: Secondary | ICD-10-CM

## 2019-06-12 DIAGNOSIS — I10 Essential (primary) hypertension: Secondary | ICD-10-CM | POA: Diagnosis not present

## 2019-06-12 DIAGNOSIS — R2231 Localized swelling, mass and lump, right upper limb: Secondary | ICD-10-CM

## 2019-06-12 DIAGNOSIS — F411 Generalized anxiety disorder: Secondary | ICD-10-CM

## 2019-06-12 DIAGNOSIS — Z23 Encounter for immunization: Secondary | ICD-10-CM | POA: Diagnosis not present

## 2019-06-12 NOTE — Progress Notes (Signed)
Subjective:    Patient ID: Sherry White, female    DOB: [birth year], 59 y.o.   MRN: [id number]  HPI    Here to f/u; overall doing ok,  Pt denies chest pain, increasing sob or doe, wheezing, orthopnea, PND, increased LE swelling, palpitations, dizziness or syncope.  Pt denies new neurological symptoms such as new headache, or facial or extremity weakness or numbness.  Pt denies polydipsia, polyuria, or low sugar episode.  Pt states overall good compliance with meds, mostly trying to follow appropriate diet, with wt overall stable,  but little exercise however.  Denies worsening depressive symptoms, suicidal ideation, or panic; has ongoing anxiety,  Has lost up to 30 lbs at one point, now regained some over holidays.  Bp at home is about 120's /80s.   Wt Readings from Last 3 Encounters:  06/12/19 199 lb (90.3 kg)  03/10/19 192 lb 9.6 oz (87.4 kg)  07/08/18 215 lb 9.6 oz (97.8 kg)  Has a new enlarging fatty discrete mass to the left post most proximal arm at the right axilla, no pain but getting bigger possibly in the past several months. Past Medical History:  Diagnosis Date  . ANXIETY 06/17/2007  . ASTHMA 06/17/2007  . CHEST PAIN 06/17/2007  . DEPRESSION 06/17/2007  . Diabetes mellitus without complication ([place])   . Disc disease, degenerative, lumbar or lumbosacral   . Glucose intolerance (impaired glucose tolerance)   . GOITER, MULTINODULAR 10/22/2008  . Hyperlipidemia 07/13/2011  . HYPERSOMNIA 12/06/2009  . HYPERTENSION 06/17/2007  . HYPERTRIGLYCERIDEMIA 06/17/2007  . Impaired glucose tolerance 11/15/2010  . Left lateral epicondylitis 11/15/2010  . MVP (mitral valve prolapse)   . NASH (nonalcoholic steatohepatitis) 06/08/2011  . OSA (obstructive sleep apnea)   . Right carpal tunnel syndrome 11/15/2010  . Sleep apnea   . THYROID NODULE, RIGHT 10/19/2008  . TIA (transient ischemic attack)    2 TIA-20 years ago   Past Surgical History:  Procedure Laterality Date  . BIOPSY BREAST  june  2010   [facility]  . CHOLECYSTECTOMY    . INGUINAL HERNIA REPAIR     1968/1969 BIL  . WISDOM TOOTH EXTRACTION      reports that she has never smoked. She has never used smokeless tobacco. She reports that she does not drink alcohol or use drugs. family history includes Alcohol abuse in her unknown relative; Breast cancer in her mother; COPD in her mother; Colon polyps in her mother; Coronary artery disease in her father; Diabetes in her father and mother; Emphysema in her maternal grandmother and mother; Hypertension in her father and mother; Thyroid disease in her sister; Tuberculosis in her unknown relative. Allergies  Allergen Reactions  . Amoxicillin Rash   Current Outpatient Medications on File Prior to Visit  Medication Sig Dispense Refill  . ALPRAZolam (XANAX) 0.25 MG tablet TAKE 1 TABLET(0.25 MG) BY MOUTH TWICE DAILY AS NEEDED FOR ANXIETY 60 tablet 3  . aspirin 81 MG EC tablet Take 81 mg by mouth daily.      . chlorthalidone (HYGROTON) 25 MG tablet TAKE 1 TABLET BY MOUTH EVERY DAY 90 tablet 1  . fenofibrate 160 MG tablet Take 1 tablet by mouth  daily 90 tablet 3  . Fluticasone-Salmeterol (ADVAIR DISKUS) 250-50 MCG/DOSE AEPB Inhale 1 puff into the lungs 2 (two) times daily. 1 each 11  . FREESTYLE LITE test strip USE AS DIRECTED AS NEEDED 100 strip 3  . Lancets (FREESTYLE) lancets USE AS DIRECTED TO CHECK BLOOD  SUGAR 100 each 3  . lisinopril (ZESTRIL) 40 MG tablet TAKE 1 TABLET BY MOUTH EVERY DAY 30 tablet 11  . Multiple Vitamin (MULTIVITAMIN WITH MINERALS) TABS tablet Take 1 tablet by mouth daily.    . pioglitazone (ACTOS) 15 MG tablet TAKE 1 TABLET BY MOUTH EVERY DAY 90 tablet 1  . rosuvastatin (CRESTOR) 40 MG tablet TAKE 1 TABLET BY MOUTH EVERY DAY 30 tablet 2  . TRULICITY 1.5 0000000 SOPN INJECT 1.5MG  UNDER THE SKIN ONCE WEEKLY. 4 pen 3  . VENTOLIN HFA 108 (90 Base) MCG/ACT inhaler INHALE 2 PUFFS INTO THE LUNGS EVERY 6 (SIX) HOURS AS NEEDED FOR WHEEZING  OR SHORTNESS OF BREATH. 18 Inhaler 2   No current facility-administered medications on file prior to visit.   Review of Systems  Constitutional: Negative for other unusual diaphoresis or sweats HENT: Negative for ear discharge or swelling Eyes: Negative for other worsening visual disturbances Respiratory: Negative for stridor or other swelling  Gastrointestinal: Negative for worsening distension or other blood Genitourinary: Negative for retention or other urinary change Musculoskeletal: Negative for other MSK pain or swelling Skin: Negative for color change or other new lesions Neurological: Negative for worsening tremors and other numbness  Psychiatric/Behavioral: Negative for worsening agitation or other fatigue All otherwise neg per pt     Objective:   Physical Exam BP (!) 150/88 (BP Location: Left Arm, Patient Position: Sitting, Cuff Size: Large)   Pulse 77   Temp 97.8 F (36.6 C) (Oral)   Ht 5\' 4"  (1.626 m)   Wt 199 lb (90.3 kg)   LMP 06/11/2012   SpO2 98%   BMI 34.16 kg/m  VS noted,  Constitutional: Pt appears in NAD HENT: Head: NCAT.  Right Ear: External ear normal.  Left Ear: External ear normal.  Eyes: . Pupils are equal, round, and reactive to light. Conjunctivae and EOM are normal Nose: without d/c or deformity Neck: Neck supple. Gross normal ROM Cardiovascular: Normal rate and regular rhythm.   Pulmonary/Chest: Effort normal and breath sounds without rales or wheezing.  Right axilla with approx 3 cm fatty mass near right axilla subq Neurological: Pt is alert. At baseline orientation, motor grossly intact Skin: Skin is warm. No rashes, other new lesions, no LE edema Psychiatric: Pt behavior is normal without agitation , 1+ nervous All otherwise neg per pt Lab Results  Component Value Date   WBC 6.2 01/03/2013   HGB 14.1 01/03/2013   HCT 41.0 01/03/2013   PLT 197.0 01/03/2013   GLUCOSE 122 (H) 02/17/2019   CHOL 159 11/18/2018   TRIG 204.0 (H) 11/18/2018    HDL 36.80 (L) 11/18/2018   LDLDIRECT 90.0 11/18/2018   LDLCALC  06/05/2007    91        Total Cholesterol/HDL:CHD Risk Coronary Heart Disease Risk Table                     Men   Women  1/2 Average Risk   3.4   3.3  Average Risk       5.0   4.4  2 X Average Risk   9.6   7.1  3 X Average Risk  23.4   11.0        Use the calculated Patient Ratio above and the CHD Risk Table to determine the patient's CHD Risk.        ATP III CLASSIFICATION (LDL):  <100     mg/dL   Optimal  100-129  mg/dL   Near  or Above                    Optimal  130-159  mg/dL   Borderline  160-189  mg/dL   High  >190     mg/dL   Very High   ALT 29 11/18/2018   AST 26 11/18/2018   NA 138 02/17/2019   K 3.9 02/17/2019   CL 104 02/17/2019   CREATININE 0.85 02/17/2019   BUN 15 02/17/2019   CO2 27 02/17/2019   TSH 2.91 11/07/2017   INR 0.9 06/04/2007   HGBA1C 6.2 02/17/2019   MICROALBUR 8.1 (H) 07/02/2018      Assessment & Plan:

## 2019-06-12 NOTE — Patient Instructions (Signed)
You had the Tdap Tetanus shot today  You will be contacted regarding the referral for: right axillary ultrasound  Please continue all other medications as before, and refills have been done if requested.  Please have the pharmacy call with any other refills you may need.  Please continue your efforts at being more active, low cholesterol diabetic diet, and weight control.  Please keep your appointments with your specialists as you may have planned

## 2019-06-15 ENCOUNTER — Encounter: Payer: Self-pay | Admitting: Internal Medicine

## 2019-06-15 ENCOUNTER — Other Ambulatory Visit: Payer: Self-pay | Admitting: Internal Medicine

## 2019-06-15 DIAGNOSIS — R2231 Localized swelling, mass and lump, right upper limb: Secondary | ICD-10-CM | POA: Insufficient documentation

## 2019-06-15 NOTE — Assessment & Plan Note (Signed)
stable overall by history and exam, recent data reviewed with pt, and pt to continue medical treatment as before,  to f/u any worsening symptoms or concerns  

## 2019-06-15 NOTE — Assessment & Plan Note (Signed)
Possible lipoma, for axillary u/s, consider general surgury

## 2019-06-18 ENCOUNTER — Other Ambulatory Visit: Payer: Self-pay | Admitting: Endocrinology

## 2019-06-19 ENCOUNTER — Other Ambulatory Visit: Payer: Self-pay | Admitting: Internal Medicine

## 2019-06-19 DIAGNOSIS — R2231 Localized swelling, mass and lump, right upper limb: Secondary | ICD-10-CM

## 2019-07-02 ENCOUNTER — Other Ambulatory Visit: Payer: Self-pay | Admitting: Internal Medicine

## 2019-07-02 ENCOUNTER — Other Ambulatory Visit: Payer: Self-pay

## 2019-07-02 ENCOUNTER — Ambulatory Visit
Admission: RE | Admit: 2019-07-02 | Discharge: 2019-07-02 | Disposition: A | Discharge status: Home / Self Care | Payer: Managed Care, Other (non HMO) | Source: Ambulatory Visit | Attending: Internal Medicine | Admitting: Internal Medicine

## 2019-07-02 DIAGNOSIS — R2231 Localized swelling, mass and lump, right upper limb: Secondary | ICD-10-CM

## 2019-07-14 ENCOUNTER — Other Ambulatory Visit: Payer: Self-pay

## 2019-07-14 ENCOUNTER — Other Ambulatory Visit (INDEPENDENT_AMBULATORY_CARE_PROVIDER_SITE_OTHER): Payer: Managed Care, Other (non HMO)

## 2019-07-14 DIAGNOSIS — E782 Mixed hyperlipidemia: Secondary | ICD-10-CM | POA: Diagnosis not present

## 2019-07-14 DIAGNOSIS — E119 Type 2 diabetes mellitus without complications: Secondary | ICD-10-CM

## 2019-07-14 DIAGNOSIS — Z794 Long term (current) use of insulin: Secondary | ICD-10-CM | POA: Diagnosis not present

## 2019-07-14 LAB — COMPREHENSIVE METABOLIC PANEL
ALT: 21 U/L (ref 0–35)
AST: 19 U/L (ref 0–37)
Albumin: 4.2 g/dL (ref 3.5–5.2)
Alkaline Phosphatase: 78 U/L (ref 39–117)
BUN: 20 mg/dL (ref 6–23)
CO2: 27 mEq/L (ref 19–32)
Calcium: 10.1 mg/dL (ref 8.4–10.5)
Chloride: 103 mEq/L (ref 96–112)
Creatinine, Ser: 0.86 mg/dL (ref 0.40–1.20)
GFR: 68.21 mL/min (ref >60.00)
Glucose, Bld: 129 mg/dL — ABNORMAL HIGH (ref 70–99)
Potassium: 4.4 mEq/L (ref 3.5–5.1)
Sodium: 137 mEq/L (ref 135–145)
Total Bilirubin: 0.4 mg/dL (ref 0.2–1.2)
Total Protein: 7.6 g/dL (ref 6.0–8.3)

## 2019-07-14 LAB — LDL CHOLESTEROL, DIRECT: Direct LDL: 109 mg/dL

## 2019-07-14 LAB — LIPID PANEL
Cholesterol: 201 mg/dL — ABNORMAL HIGH (ref 0–200)
HDL: 45.9 mg/dL (ref >39.00)
NonHDL: 155.11
Total CHOL/HDL Ratio: 4
Triglycerides: 272 mg/dL — ABNORMAL HIGH (ref 0.0–149.0)
VLDL: 54.4 mg/dL — ABNORMAL HIGH (ref 0.0–40.0)

## 2019-07-14 LAB — MICROALBUMIN / CREATININE URINE RATIO
Creatinine,U: 184.5 mg/dL
Microalb Creat Ratio: 0.7 mg/g (ref 0.0–30.0)
Microalb, Ur: 1.4 mg/dL (ref 0.0–1.9)

## 2019-07-14 LAB — HEMOGLOBIN A1C: Hgb A1c MFr Bld: 6.2 % (ref 4.6–6.5)

## 2019-07-17 ENCOUNTER — Other Ambulatory Visit: Payer: Self-pay

## 2019-07-17 ENCOUNTER — Encounter: Payer: Self-pay | Admitting: Endocrinology

## 2019-07-17 ENCOUNTER — Ambulatory Visit (INDEPENDENT_AMBULATORY_CARE_PROVIDER_SITE_OTHER): Payer: Managed Care, Other (non HMO) | Admitting: Endocrinology

## 2019-07-17 DIAGNOSIS — E119 Type 2 diabetes mellitus without complications: Secondary | ICD-10-CM | POA: Diagnosis not present

## 2019-07-17 DIAGNOSIS — Z794 Long term (current) use of insulin: Secondary | ICD-10-CM | POA: Diagnosis not present

## 2019-07-17 DIAGNOSIS — E782 Mixed hyperlipidemia: Secondary | ICD-10-CM | POA: Diagnosis not present

## 2019-07-17 NOTE — Progress Notes (Signed)
Patient ID: Sherry White, female   DOB: [birth year], 59 y.o.   MRN: [id number]   Reason for Appointment : Followup for endocrinology problems   I connected with the above-named patient by video enabled telemedicine application and verified that I am speaking with the correct person. The patient was explained the limitations of evaluation and management by telemedicine and the availability of in person appointments.  Patient also understood that there may be a patient responsible charge related to this service . Location of the patient: Patient's home . Location of the provider: Physician office Only the patient and myself were participating in the encounter The patient understood the above statements and agreed to proceed.   History of Present Illness           Diagnosis: Type 2 diabetes mellitus, date of diagnosis: 12/2012         Past history: For 2 months prior to diagnosis the patient had been having increased thirst and urination along with some blurred vision She was seen by her primary care physician and glucose was 593 an A1c of 11.4 Her urine showed small ketones also Because of her high sugars she was started on Levemir insulin. This was subsequently changed to NovoLog 0000000 for simplicity and postprandial control and 8/14.   RECENT history:    Non-insulin hypoglycemic drugs: Trulicity 1.5 mg weekly, Actos 15 mg daily  Her A1c is 6.2 as before  Current management, blood sugar patterns and problems:  She has been off insulin since 12/2018  Again she is still able to keep her blood sugar down with generally watching her diet and keeping her weight consistent  Blood sugars at home are generally fairly good although not checking after dinner  Recent blood sugar range 113-154 with most readings in the mornings or midday  Recently because of cold weather she has stopped her walking and has not found any other exercise routine  Also she thinks that in her  diet she is eating more higher fat dairy products recently  Otherwise trying to cut back on carbohydrates and any sweets or sweet drinks most of the time  No nausea with Trulicity which she takes regularly every week   Glucose monitoring:  done about every other  day      Glucometer:   freestyle    Blood Glucose readings from the patient messaging her blood sugars as above  PREVIOUS sugar range 87-134 over the 30 days with average 106     Self-care: DIET: Improved as above  She has discussed diet with nurse educator in 2016   Wt Readings from Last 3 Encounters:  06/12/19 199 lb (90.3 kg)  03/10/19 192 lb 9.6 oz (87.4 kg)  07/08/18 215 lb 9.6 oz (97.8 kg)     Lab Results  Component Value Date   HGBA1C 6.2 07/14/2019   HGBA1C 6.2 02/17/2019   HGBA1C 6.9 (H) 11/18/2018   Lab Results  Component Value Date   MICROALBUR 1.4 07/14/2019   [place]  06/05/2007    91        Total Cholesterol/HDL:CHD Risk Coronary Heart Disease Risk Table                     Men   Women  1/2 Average Risk   3.4   3.3  Average Risk       5.0   4.4  2 X Average Risk   9.6   7.1  3 X Average  Risk  23.4   11.0        Use the calculated Patient Ratio above and the CHD Risk Table to determine the patient's CHD Risk.        ATP III CLASSIFICATION (LDL):  <100     mg/dL   Optimal  100-129  mg/dL   Near or Above                    Optimal  130-159  mg/dL   Borderline  160-189  mg/dL   High  >190     mg/dL   Very High   CREATININE 0.86 07/14/2019     Allergies as of 07/17/2019      Reactions   Amoxicillin Rash      Medication List       Accurate as of July 17, 2019  9:05 AM. If you have any questions, ask your nurse or doctor.        ALPRAZolam 0.25 MG tablet Commonly known as: XANAX TAKE 1 TABLET(0.25 MG) BY MOUTH TWICE DAILY AS NEEDED FOR ANXIETY   aspirin 81 MG EC tablet Take 81 mg by mouth daily.   chlorthalidone 25 MG tablet Commonly known as: HYGROTON TAKE 1 TABLET  BY MOUTH EVERY DAY   fenofibrate 160 MG tablet Take 1 tablet by mouth  daily   Fluticasone-Salmeterol 250-50 MCG/DOSE Aepb Commonly known as: Advair Diskus Inhale 1 puff into the lungs 2 (two) times daily.   freestyle lancets USE AS DIRECTED TO CHECK BLOOD SUGAR   FREESTYLE LITE test strip Generic drug: glucose blood USE AS DIRECTED AS NEEDED   lisinopril 40 MG tablet Commonly known as: ZESTRIL TAKE 1 TABLET BY MOUTH EVERY DAY   multivitamin with minerals Tabs tablet Take 1 tablet by mouth daily.   pioglitazone 15 MG tablet Commonly known as: ACTOS TAKE 1 TABLET BY MOUTH EVERY DAY   rosuvastatin 40 MG tablet Commonly known as: CRESTOR TAKE 1 TABLET BY MOUTH EVERY DAY   sertraline 100 MG tablet Commonly known as: ZOLOFT TAKE 1 TABLET BY MOUTH TWICE A DAY   Trulicity 1.5 0000000 Sopn Generic drug: Dulaglutide INJECT 1.5MG  UNDER THE SKIN ONCE WEEKLY.   Ventolin HFA 108 (90 Base) MCG/ACT inhaler Generic drug: albuterol INHALE 2 PUFFS INTO THE LUNGS EVERY 6 (SIX) HOURS AS NEEDED FOR WHEEZING OR SHORTNESS OF BREATH.       Allergies:  Allergies  Allergen Reactions  . Amoxicillin Rash    Past Medical History:  Diagnosis Date  . ANXIETY 06/17/2007  . ASTHMA 06/17/2007  . CHEST PAIN 06/17/2007  . DEPRESSION 06/17/2007  . Diabetes mellitus without complication ([place])   . Disc disease, degenerative, lumbar or lumbosacral   . Glucose intolerance (impaired glucose tolerance)   . GOITER, MULTINODULAR 10/22/2008  . Hyperlipidemia 07/13/2011  . HYPERSOMNIA 12/06/2009  . HYPERTENSION 06/17/2007  . HYPERTRIGLYCERIDEMIA 06/17/2007  . Impaired glucose tolerance 11/15/2010  . Left lateral epicondylitis 11/15/2010  . MVP (mitral valve prolapse)   . NASH (nonalcoholic steatohepatitis) 06/08/2011  . OSA (obstructive sleep apnea)   . Right carpal tunnel syndrome 11/15/2010  . Sleep apnea   . THYROID NODULE, RIGHT 10/19/2008  . TIA (transient ischemic attack)    2 TIA-20 years ago      Past Surgical History:  Procedure Laterality Date  . BIOPSY BREAST  june 2010   [facility]  . CHOLECYSTECTOMY    . INGUINAL HERNIA REPAIR     1968/1969 BIL  .  WISDOM TOOTH EXTRACTION      Family History  Problem Relation Age of Onset  . Hypertension Father   . Diabetes Father   . Coronary artery disease Father   . Hypertension Mother   . Diabetes Mother   . Breast cancer Mother   . Emphysema Mother   . COPD Mother   . Colon polyps Mother   . Thyroid disease Sister        unknown type  . Alcohol abuse Other        uncle  . Tuberculosis Other        aunt  . Emphysema Maternal Grandmother     Social History:  reports that she has never smoked. She has never used smokeless tobacco. She reports that she does not drink alcohol or use drugs.    Review of Systems       Lipids: She has increase in triglycerides and LDL  Triglycerides have been high, previously over 400 and now mostly over 200 Currently on 40 mg of Crestor, fenofibrate 160 mg and Actos LDL which was below 90 is now higher at 109 With not being consistent on her diet with dairy products her parameters have gone up as below  Lab Results  Component Value Date   CHOL 201 (H) 07/14/2019   CHOL 159 11/18/2018   CHOL 197 07/02/2018   Lab Results  Component Value Date   HDL 45.90 07/14/2019   HDL 36.80 (L) 11/18/2018   HDL 37.90 (L) 07/02/2018   Lab Results  Component Value Date   LDLCALC  06/05/2007    91        Total Cholesterol/HDL:CHD Risk Coronary Heart Disease Risk Table                     Men   Women  1/2 Average Risk   3.4   3.3  Average Risk       5.0   4.4  2 X Average Risk   9.6   7.1  3 X Average Risk  23.4   11.0        Use the calculated Patient Ratio above and the CHD Risk Table to determine the patient's CHD Risk.        ATP III CLASSIFICATION (LDL):  <100     mg/dL   Optimal  100-129  mg/dL   Near or Above                    Optimal  130-159   mg/dL   Borderline  160-189  mg/dL   High  >190     mg/dL   Very High   Lab Results  Component Value Date   TRIG 272.0 (H) 07/14/2019   TRIG 204.0 (H) 11/18/2018   TRIG 266.0 (H) 07/02/2018   Lab Results  Component Value Date   CHOLHDL 4 07/14/2019   CHOLHDL 4 11/18/2018   CHOLHDL 5 07/02/2018   Lab Results  Component Value Date   LDLDIRECT 109.0 07/14/2019   LDLDIRECT 90.0 11/18/2018   LDLDIRECT 105.0 [id number]        Complications: Hepatic steatosis, Diagnosis initially made in 2012 Liver functions are consistently normal with better control of sugars and using Actos  Lab Results  Component Value Date   ALT 21 07/14/2019   HYPERTENSION:   She is taking her lisinopril 40 mg as prescribed Also taking chlorthalidone 25 mg  Blood pressure has been variable in the office  BP  Readings from Last 3 Encounters:  06/12/19 (!) 150/88  03/10/19 118/80  07/08/18 (!) 148/90    History of multinodular goiter: She had a follow-up ultrasound which did not show any changes, she had requested a follow-up study   Physical Examination:  LMP 06/11/2012      ASSESSMENT:  Diabetes type 2, with obesity  See history of present illness for detailed discussion of current diabetes management, blood sugar patterns and problems identified  Her A1c is consistently better and again only 6.2  She has good control on Trulicity 1.5 mg and Actos Again has been able to avoid going back on insulin with generally keeping her diet controlled and keeping her weight down As above her diet has been less consistent and her exercise regimen has not been continued recently However blood sugars at home are mostly near normal with most readings mornings and midday and lab glucose 129 fasting  HYPERTENSION: Blood pressure to be managed by PCP as before   LIPIDS: Her lipids show worsening especially with triglycerides are also high her LDL from not watching her diet as before Currently on  fenofibrate and Crestor 40 mg which she is compliant with  History of goiter: This has been fully evaluated previously and will need periodic TSH levels   PLAN:  Encouraged her to start an exercise program with some indoor video exercises until she can start walking again No change in medications required since A1c is fairly good and weight is stable However discussed possibility of increasing Trulicity if needed Need to start checking blood sugars after dinner more often and not just morning and midday  She will try to cut back on high fat dairy products or needs to help improve her lipid panel Discussed possibility of needing Vascepa or Lovaza if triglycerides stay consistently over 200, this may also reduce cardiovascular risk She will follow-up with her PCP regarding blood pressure  Follow-up in 3 months  There are no Patient Instructions on file for this visit.    Elayne Snare 07/17/2019, 9:05 AM

## 2019-07-18 ENCOUNTER — Other Ambulatory Visit: Payer: Self-pay | Admitting: *Deleted

## 2019-07-18 MED ORDER — ROSUVASTATIN CALCIUM 40 MG PO TABS: 40.0000 mg | ORAL_TABLET | Freq: Every day | ORAL | 5 refills | Status: DC

## 2019-07-31 LAB — HM DIABETES EYE EXAM

## 2019-08-09 ENCOUNTER — Other Ambulatory Visit: Payer: Self-pay | Admitting: Endocrinology

## 2019-08-11 ENCOUNTER — Other Ambulatory Visit: Payer: Self-pay

## 2019-08-11 MED ORDER — ONETOUCH DELICA LANCETS 30G MISC: 1.0000 | 2 refills | Status: AC

## 2019-08-11 MED ORDER — ONETOUCH VERIO VI STRP: ORAL_STRIP | 2 refills | Status: AC

## 2019-08-29 ENCOUNTER — Other Ambulatory Visit: Payer: Self-pay | Admitting: Endocrinology

## 2019-09-17 ENCOUNTER — Telehealth: Payer: Self-pay | Admitting: Endocrinology

## 2019-09-17 NOTE — Telephone Encounter (Signed)
-----   Message from Oval Linsey sent at 09/03/2019  2:14 PM EDT ----- Regarding: FW: Appointment LMTCB to schedule, sent mychart message as well. ----- Message ----- From: Elayne Snare, MD Sent: 08/30/2019   3:44 PM EDT To: [facility] Subject: Appointment                                    Please schedule follow-up in 5/21 with labs

## 2019-09-17 NOTE — Telephone Encounter (Signed)
LVM to schedule follow up. Also sent My Chart message

## 2019-09-29 ENCOUNTER — Other Ambulatory Visit: Payer: Self-pay | Admitting: Endocrinology

## 2019-10-08 ENCOUNTER — Other Ambulatory Visit: Payer: Self-pay | Admitting: Endocrinology

## 2019-11-21 ENCOUNTER — Telehealth: Payer: Self-pay

## 2019-11-21 NOTE — Telephone Encounter (Signed)
PA initiated via [email or web address] for Trulicity 1.5mg .  IVIONNA VERLEY  Key: [id number]   PA Case ID: [id number]   Rx #: [id number] Call us at [phone] Status: Sent to [facility]: Trulicity 1.5MG /0.5ML pen-injectors Form: Charity fundraiser PA Form [phone] NCPDP) Original Claim Info: 75

## 2019-11-24 NOTE — Telephone Encounter (Signed)
Received fax from [place] stating that PA has been approved for Trulicity. Approved from 11/21/19 through 11/20/2020

## 2019-12-02 ENCOUNTER — Other Ambulatory Visit: Payer: Self-pay | Admitting: Internal Medicine

## 2019-12-02 NOTE — Telephone Encounter (Signed)
Please refill as per office routine med refill policy (all routine meds refilled for 3 mo or monthly per pt preference up to one year from last visit, then month to month grace period for 3 mo, then further med refills will have to be denied)  

## 2020-01-15 ENCOUNTER — Other Ambulatory Visit: Payer: Self-pay | Admitting: Endocrinology

## 2020-02-26 ENCOUNTER — Other Ambulatory Visit: Payer: Self-pay | Admitting: Internal Medicine

## 2020-02-26 NOTE — Telephone Encounter (Signed)
Please refill as per office routine med refill policy (all routine meds refilled for 3 mo or monthly per pt preference up to one year from last visit, then month to month grace period for 3 mo, then further med refills will have to be denied)  

## 2020-09-07 ENCOUNTER — Other Ambulatory Visit: Payer: Self-pay | Admitting: Internal Medicine

## 2020-09-07 NOTE — Telephone Encounter (Signed)
Please refill as per office routine med refill policy (all routine meds refilled for 3 mo or monthly per pt preference up to one year from last visit, then month to month grace period for 3 mo, then further med refills will have to be denied)  

## 2020-09-22 ENCOUNTER — Other Ambulatory Visit: Payer: Self-pay | Admitting: Internal Medicine

## 2020-09-23 NOTE — Telephone Encounter (Signed)
Please to contact pt   Pt needs ROV for further refills zoloft or other meds  Last OV was jan 2021

## 2020-09-23 NOTE — Telephone Encounter (Signed)
Left message for patient to call office and schedule yearly exam

## 2020-10-08 ENCOUNTER — Other Ambulatory Visit: Payer: Self-pay | Admitting: Internal Medicine

## 2020-10-08 NOTE — Telephone Encounter (Signed)
Please refill as per office routine med refill policy (all routine meds refilled for 3 mo or monthly per pt preference up to one year from last visit, then month to month grace period for 3 mo, then further med refills will have to be denied)  

## 2020-10-12 ENCOUNTER — Other Ambulatory Visit: Payer: Self-pay | Admitting: Internal Medicine

## 2020-10-12 NOTE — Telephone Encounter (Signed)
Please refill as per office routine med refill policy (all routine meds refilled for 3 mo or monthly per pt preference up to one year from last visit, then month to month grace period for 3 mo, then further med refills will have to be denied)  

## 2020-10-13 ENCOUNTER — Telehealth: Payer: Self-pay

## 2020-10-13 NOTE — Telephone Encounter (Signed)
Refill request received however patient is in need of an appt has not been seen since 05/2019.

## 2020-10-16 ENCOUNTER — Other Ambulatory Visit: Payer: Self-pay | Admitting: Internal Medicine
